# Patient Record
Sex: Female | Born: 1980 | Race: White | Hispanic: No | Marital: Married | Smoking: Never smoker
Health system: Southern US, Community
[De-identification: ages and names within clinical notes are randomized; demographics above are authoritative.]

## PROBLEM LIST (undated history)

## (undated) ENCOUNTER — Ambulatory Visit

## (undated) DIAGNOSIS — K2970 Gastritis, unspecified, without bleeding: Secondary | ICD-10-CM

## (undated) DIAGNOSIS — G43909 Migraine, unspecified, not intractable, without status migrainosus: Secondary | ICD-10-CM

## (undated) DIAGNOSIS — F32A Depression, unspecified: Secondary | ICD-10-CM

## (undated) DIAGNOSIS — K219 Gastro-esophageal reflux disease without esophagitis: Secondary | ICD-10-CM

## (undated) DIAGNOSIS — M419 Scoliosis, unspecified: Secondary | ICD-10-CM

## (undated) DIAGNOSIS — F329 Major depressive disorder, single episode, unspecified: Secondary | ICD-10-CM

## (undated) DIAGNOSIS — K259 Gastric ulcer, unspecified as acute or chronic, without hemorrhage or perforation: Secondary | ICD-10-CM

## (undated) DIAGNOSIS — F419 Anxiety disorder, unspecified: Secondary | ICD-10-CM

## (undated) HISTORY — DX: Depression, unspecified: F32.A

## (undated) HISTORY — DX: Gastro-esophageal reflux disease without esophagitis: K21.9

## (undated) HISTORY — PX: RHINOPLASTY: SUR1284

## (undated) HISTORY — DX: Migraine, unspecified, not intractable, without status migrainosus: G43.909

## (undated) HISTORY — DX: Gastric ulcer, unspecified as acute or chronic, without hemorrhage or perforation: K25.9

## (undated) HISTORY — DX: Major depressive disorder, single episode, unspecified: F32.9

## (undated) HISTORY — DX: Anxiety disorder, unspecified: F41.9

## (undated) HISTORY — DX: Gastritis, unspecified, without bleeding: K29.70

---
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
## 2005-02-04 ENCOUNTER — Emergency Department: Payer: Self-pay | Admitting: Emergency Medicine

## 2005-03-12 ENCOUNTER — Emergency Department: Payer: Self-pay | Admitting: Unknown Physician Specialty

## 2005-10-06 ENCOUNTER — Emergency Department: Payer: Self-pay | Admitting: Internal Medicine

## 2006-03-24 ENCOUNTER — Emergency Department: Payer: Self-pay | Admitting: Emergency Medicine

## 2006-12-24 ENCOUNTER — Emergency Department: Payer: Self-pay

## 2008-07-25 ENCOUNTER — Emergency Department: Payer: Self-pay | Admitting: Emergency Medicine

## 2008-10-10 ENCOUNTER — Emergency Department: Payer: Self-pay | Admitting: Unknown Physician Specialty

## 2008-12-08 ENCOUNTER — Emergency Department: Payer: Self-pay | Admitting: Emergency Medicine

## 2009-04-18 ENCOUNTER — Emergency Department: Payer: Self-pay | Admitting: Unknown Physician Specialty

## 2009-05-08 ENCOUNTER — Observation Stay: Payer: Self-pay

## 2009-06-01 ENCOUNTER — Observation Stay: Payer: Self-pay

## 2009-06-05 ENCOUNTER — Inpatient Hospital Stay: Payer: Self-pay

## 2010-01-20 ENCOUNTER — Emergency Department: Payer: Self-pay | Admitting: Emergency Medicine

## 2010-03-30 ENCOUNTER — Emergency Department: Payer: Self-pay | Admitting: Internal Medicine

## 2010-06-22 ENCOUNTER — Emergency Department: Payer: Self-pay | Admitting: Emergency Medicine

## 2010-10-18 ENCOUNTER — Emergency Department: Payer: Self-pay | Admitting: Internal Medicine

## 2010-11-07 ENCOUNTER — Emergency Department: Payer: Self-pay | Admitting: Emergency Medicine

## 2011-04-10 ENCOUNTER — Emergency Department: Payer: Self-pay | Admitting: Unknown Physician Specialty

## 2011-08-04 ENCOUNTER — Observation Stay: Payer: Self-pay

## 2011-11-12 HISTORY — PX: TUBAL LIGATION: SHX77

## 2011-12-11 ENCOUNTER — Inpatient Hospital Stay: Payer: Self-pay

## 2011-12-11 LAB — CBC WITH DIFFERENTIAL/PLATELET
Eosinophil #: 0.1 10*3/uL (ref 0.0–0.7)
Eosinophil %: 0.5 %
HCT: 33.4 % — ABNORMAL LOW (ref 35.0–47.0)
HGB: 11.2 g/dL — ABNORMAL LOW (ref 12.0–16.0)
Lymphocyte #: 2.7 10*3/uL (ref 1.0–3.6)
Lymphocyte %: 24.9 %
MCHC: 33.4 g/dL (ref 32.0–36.0)
MCV: 94 fL (ref 80–100)
Monocyte #: 1 10*3/uL — ABNORMAL HIGH (ref 0.0–0.7)
Neutrophil %: 64.7 %
RBC: 3.56 10*6/uL — ABNORMAL LOW (ref 3.80–5.20)
RDW: 14.1 % (ref 11.5–14.5)

## 2011-12-13 LAB — HEMATOCRIT: HCT: 30.5 % — ABNORMAL LOW (ref 35.0–47.0)

## 2012-08-29 ENCOUNTER — Emergency Department: Payer: Self-pay | Admitting: Emergency Medicine

## 2012-08-29 LAB — URINALYSIS, COMPLETE
Bilirubin,UR: NEGATIVE
Blood: NEGATIVE
Glucose,UR: NEGATIVE mg/dL (ref 0–75)
Ketone: NEGATIVE
Leukocyte Esterase: NEGATIVE
Ph: 7 (ref 4.5–8.0)
Protein: NEGATIVE
Squamous Epithelial: 2

## 2012-08-29 LAB — CBC
HGB: 13 g/dL (ref 12.0–16.0)
MCHC: 34.3 g/dL (ref 32.0–36.0)
Platelet: 298 10*3/uL (ref 150–440)
RBC: 4.17 10*6/uL (ref 3.80–5.20)
WBC: 7.5 10*3/uL (ref 3.6–11.0)

## 2012-08-29 LAB — BASIC METABOLIC PANEL
Anion Gap: 9 (ref 7–16)
Calcium, Total: 8.2 mg/dL — ABNORMAL LOW (ref 8.5–10.1)
Chloride: 109 mmol/L — ABNORMAL HIGH (ref 98–107)
Creatinine: 0.7 mg/dL (ref 0.60–1.30)
EGFR (Non-African Amer.): >60
Glucose: 104 mg/dL — ABNORMAL HIGH (ref 65–99)
Osmolality: 284 (ref 275–301)
Potassium: 3.6 mmol/L (ref 3.5–5.1)

## 2012-08-29 LAB — PREGNANCY, URINE: Pregnancy Test, Urine: NEGATIVE m[IU]/mL

## 2012-11-11 HISTORY — PX: CHOLECYSTECTOMY: SHX55

## 2012-12-05 ENCOUNTER — Emergency Department: Payer: Self-pay | Admitting: Emergency Medicine

## 2012-12-05 LAB — URINALYSIS, COMPLETE
Bilirubin,UR: NEGATIVE
Nitrite: NEGATIVE
Ph: 6 (ref 4.5–8.0)
RBC,UR: 1 /HPF (ref 0–5)
Specific Gravity: 1.025 (ref 1.003–1.030)
WBC UR: 3 /HPF (ref 0–5)

## 2012-12-05 LAB — WET PREP, GENITAL

## 2013-03-29 ENCOUNTER — Emergency Department: Payer: Self-pay | Admitting: Emergency Medicine

## 2013-04-30 ENCOUNTER — Inpatient Hospital Stay: Payer: Self-pay | Admitting: Specialist

## 2013-04-30 LAB — COMPREHENSIVE METABOLIC PANEL
Albumin: 3.5 g/dL (ref 3.4–5.0)
Anion Gap: 7 (ref 7–16)
Bilirubin,Total: 0.5 mg/dL (ref 0.2–1.0)
EGFR (African American): >60
SGOT(AST): 22 U/L (ref 15–37)
SGPT (ALT): 25 U/L (ref 12–78)
Sodium: 138 mmol/L (ref 136–145)
Total Protein: 7.4 g/dL (ref 6.4–8.2)

## 2013-04-30 LAB — URINALYSIS, COMPLETE
Bilirubin,UR: NEGATIVE
Leukocyte Esterase: NEGATIVE
Nitrite: NEGATIVE
Ph: 7 (ref 4.5–8.0)
WBC UR: 5 /HPF (ref 0–5)

## 2013-04-30 LAB — GC/CHLAMYDIA PROBE AMP

## 2013-04-30 LAB — CBC
HCT: 35.3 % (ref 35.0–47.0)
HGB: 12 g/dL (ref 12.0–16.0)
MCV: 91 fL (ref 80–100)

## 2013-05-01 LAB — CBC WITH DIFFERENTIAL/PLATELET
Basophil #: 0 10*3/uL (ref 0.0–0.1)
Basophil %: 0.1 %
Eosinophil #: 0 10*3/uL (ref 0.0–0.7)
Eosinophil %: 0.1 %
HCT: 31.3 % — ABNORMAL LOW (ref 35.0–47.0)
HGB: 10.7 g/dL — ABNORMAL LOW (ref 12.0–16.0)
Lymphocyte %: 11.5 %
MCH: 31 pg (ref 26.0–34.0)
MCHC: 34.1 g/dL (ref 32.0–36.0)
Monocyte %: 11 %
Neutrophil #: 11 10*3/uL — ABNORMAL HIGH (ref 1.4–6.5)
Neutrophil %: 77.3 %
RBC: 3.44 10*6/uL — ABNORMAL LOW (ref 3.80–5.20)
RDW: 12.7 % (ref 11.5–14.5)
WBC: 14.2 10*3/uL — ABNORMAL HIGH (ref 3.6–11.0)

## 2013-05-01 LAB — BASIC METABOLIC PANEL
Anion Gap: 7 (ref 7–16)
Calcium, Total: 7.6 mg/dL — ABNORMAL LOW (ref 8.5–10.1)
Chloride: 110 mmol/L — ABNORMAL HIGH (ref 98–107)
Co2: 24 mmol/L (ref 21–32)
EGFR (African American): >60
EGFR (Non-African Amer.): >60
Glucose: 111 mg/dL — ABNORMAL HIGH (ref 65–99)
Osmolality: 279 (ref 275–301)
Potassium: 3.2 mmol/L — ABNORMAL LOW (ref 3.5–5.1)

## 2013-05-01 LAB — LIPID PANEL
Cholesterol: 104 mg/dL (ref 0–200)
HDL Cholesterol: 42 mg/dL (ref 40–60)
VLDL Cholesterol, Calc: 13 mg/dL (ref 5–40)

## 2013-05-01 LAB — MAGNESIUM: Magnesium: 1.6 mg/dL — ABNORMAL LOW

## 2013-05-02 LAB — BASIC METABOLIC PANEL
Anion Gap: 7 (ref 7–16)
BUN: 5 mg/dL — ABNORMAL LOW (ref 7–18)
Calcium, Total: 7.4 mg/dL — ABNORMAL LOW (ref 8.5–10.1)
Chloride: 111 mmol/L — ABNORMAL HIGH (ref 98–107)
Creatinine: 0.63 mg/dL (ref 0.60–1.30)
EGFR (African American): >60
Glucose: 99 mg/dL (ref 65–99)
Osmolality: 284 (ref 275–301)

## 2013-05-06 LAB — CULTURE, BLOOD (SINGLE)

## 2013-05-18 LAB — URINALYSIS, COMPLETE
Nitrite: NEGATIVE
Protein: 30
RBC,UR: 8 /HPF (ref 0–5)
Specific Gravity: 1.025 (ref 1.003–1.030)
Squamous Epithelial: 15
WBC UR: 34 /HPF (ref 0–5)

## 2013-05-18 LAB — COMPREHENSIVE METABOLIC PANEL
Alkaline Phosphatase: 74 U/L (ref 50–136)
Anion Gap: 9 (ref 7–16)
BUN: 11 mg/dL (ref 7–18)
Calcium, Total: 8.9 mg/dL (ref 8.5–10.1)
Chloride: 103 mmol/L (ref 98–107)
Co2: 26 mmol/L (ref 21–32)
Creatinine: 0.73 mg/dL (ref 0.60–1.30)
Glucose: 101 mg/dL — ABNORMAL HIGH (ref 65–99)
SGOT(AST): 14 U/L — ABNORMAL LOW (ref 15–37)
Sodium: 138 mmol/L (ref 136–145)
Total Protein: 7.8 g/dL (ref 6.4–8.2)

## 2013-05-18 LAB — PREGNANCY, URINE: Pregnancy Test, Urine: NEGATIVE m[IU]/mL

## 2013-05-18 LAB — CBC
HCT: 35.7 % (ref 35.0–47.0)
HGB: 12.1 g/dL (ref 12.0–16.0)
MCHC: 34 g/dL (ref 32.0–36.0)
MCV: 91 fL (ref 80–100)

## 2013-05-18 LAB — LIPASE, BLOOD: Lipase: 90 U/L (ref 73–393)

## 2013-05-19 ENCOUNTER — Observation Stay: Payer: Self-pay | Admitting: Surgery

## 2013-05-20 LAB — CBC WITH DIFFERENTIAL/PLATELET
Basophil #: 0 10*3/uL (ref 0.0–0.1)
Basophil %: 0.6 %
Eosinophil %: 2.2 %
HCT: 31.4 % — ABNORMAL LOW (ref 35.0–47.0)
MCH: 31.6 pg (ref 26.0–34.0)
MCHC: 35.1 g/dL (ref 32.0–36.0)
MCV: 90 fL (ref 80–100)
Monocyte %: 13.2 %
Neutrophil #: 2.1 10*3/uL (ref 1.4–6.5)
Neutrophil %: 36.5 %
RBC: 3.49 10*6/uL — ABNORMAL LOW (ref 3.80–5.20)
RDW: 12.9 % (ref 11.5–14.5)

## 2013-05-20 LAB — COMPREHENSIVE METABOLIC PANEL
Albumin: 3.1 g/dL — ABNORMAL LOW (ref 3.4–5.0)
Alkaline Phosphatase: 61 U/L (ref 50–136)
Anion Gap: 8 (ref 7–16)
Bilirubin,Total: 0.2 mg/dL (ref 0.2–1.0)
Chloride: 106 mmol/L (ref 98–107)
Co2: 27 mmol/L (ref 21–32)
Creatinine: 0.79 mg/dL (ref 0.60–1.30)
Glucose: 103 mg/dL — ABNORMAL HIGH (ref 65–99)
Osmolality: 278 (ref 275–301)
Sodium: 141 mmol/L (ref 136–145)
Total Protein: 6.6 g/dL (ref 6.4–8.2)

## 2013-06-21 ENCOUNTER — Ambulatory Visit: Payer: Self-pay | Admitting: Gastroenterology

## 2013-08-11 ENCOUNTER — Emergency Department: Payer: Self-pay | Admitting: Emergency Medicine

## 2013-08-16 ENCOUNTER — Ambulatory Visit: Payer: Self-pay | Admitting: Obstetrics and Gynecology

## 2013-08-16 LAB — CBC
HCT: 35 % (ref 35.0–47.0)
MCH: 30.6 pg (ref 26.0–34.0)
Platelet: 323 10*3/uL (ref 150–440)
RBC: 3.8 10*6/uL (ref 3.80–5.20)
RDW: 13.3 % (ref 11.5–14.5)
WBC: 22.5 10*3/uL — ABNORMAL HIGH (ref 3.6–11.0)

## 2013-08-16 LAB — COMPREHENSIVE METABOLIC PANEL
Albumin: 3.3 g/dL — ABNORMAL LOW (ref 3.4–5.0)
Anion Gap: 9 (ref 7–16)
BUN: 10 mg/dL (ref 7–18)
Chloride: 110 mmol/L — ABNORMAL HIGH (ref 98–107)
Creatinine: 0.91 mg/dL (ref 0.60–1.30)
EGFR (African American): >60
Glucose: 167 mg/dL — ABNORMAL HIGH (ref 65–99)
Osmolality: 282 (ref 275–301)
SGOT(AST): 17 U/L (ref 15–37)
Sodium: 140 mmol/L (ref 136–145)
Total Protein: 6.1 g/dL — ABNORMAL LOW (ref 6.4–8.2)

## 2013-08-16 LAB — LIPASE, BLOOD: Lipase: 69 U/L — ABNORMAL LOW (ref 73–393)

## 2013-08-16 LAB — HCG, QUANTITATIVE, PREGNANCY: Beta Hcg, Quant.: <1 m[IU]/mL — ABNORMAL LOW

## 2013-08-17 LAB — URINALYSIS, COMPLETE
Bilirubin,UR: NEGATIVE
Glucose,UR: NEGATIVE mg/dL (ref 0–75)
Ketone: NEGATIVE
RBC,UR: 12 /HPF (ref 0–5)
Specific Gravity: 1.009 (ref 1.003–1.030)
Squamous Epithelial: 5
WBC UR: 466 /HPF (ref 0–5)

## 2013-08-17 LAB — HEMATOCRIT: HCT: 23.6 % — ABNORMAL LOW (ref 35.0–47.0)

## 2013-08-19 LAB — URINE CULTURE

## 2013-10-05 ENCOUNTER — Emergency Department: Payer: Self-pay | Admitting: Emergency Medicine

## 2013-10-11 ENCOUNTER — Other Ambulatory Visit: Payer: Self-pay

## 2013-10-11 LAB — CBC WITH DIFFERENTIAL/PLATELET
Basophil %: 0.6 %
Eosinophil #: 0.2 10*3/uL (ref 0.0–0.7)
Eosinophil %: 1.4 %
HCT: 38.2 % (ref 35.0–47.0)
HGB: 12.8 g/dL (ref 12.0–16.0)
Lymphocyte #: 3.3 10*3/uL (ref 1.0–3.6)
MCH: 30.4 pg (ref 26.0–34.0)
MCHC: 33.4 g/dL (ref 32.0–36.0)
Monocyte %: 7.8 %
Neutrophil %: 61.6 %
Platelet: 331 10*3/uL (ref 150–440)
RBC: 4.2 10*6/uL (ref 3.80–5.20)
RDW: 12.7 % (ref 11.5–14.5)
WBC: 11.4 10*3/uL — ABNORMAL HIGH (ref 3.6–11.0)

## 2013-10-11 LAB — RAPID HIV-1/2 QL/CONFIRM: HIV-1/2,Rapid Ql: NEGATIVE

## 2013-11-11 HISTORY — PX: EXPLORATORY LAPAROTOMY: SUR591

## 2013-12-26 ENCOUNTER — Emergency Department: Payer: Self-pay

## 2013-12-30 ENCOUNTER — Other Ambulatory Visit: Payer: Self-pay | Admitting: Family Medicine

## 2013-12-30 LAB — COMPREHENSIVE METABOLIC PANEL
Albumin: 3.7 g/dL (ref 3.4–5.0)
Alkaline Phosphatase: 72 U/L
Anion Gap: 9 (ref 7–16)
BUN: 11 mg/dL (ref 7–18)
Bilirubin,Total: 0.2 mg/dL (ref 0.2–1.0)
Calcium, Total: 8.9 mg/dL (ref 8.5–10.1)
Chloride: 109 mmol/L — ABNORMAL HIGH (ref 98–107)
Co2: 23 mmol/L (ref 21–32)
Creatinine: 0.7 mg/dL (ref 0.60–1.30)
EGFR (African American): >60
EGFR (Non-African Amer.): >60
Glucose: 70 mg/dL (ref 65–99)
Osmolality: 279 (ref 275–301)
Potassium: 3.9 mmol/L (ref 3.5–5.1)
SGOT(AST): 19 U/L (ref 15–37)
SGPT (ALT): 21 U/L (ref 12–78)
Sodium: 141 mmol/L (ref 136–145)
Total Protein: 7.4 g/dL (ref 6.4–8.2)

## 2013-12-30 LAB — CBC WITH DIFFERENTIAL/PLATELET
BASOS ABS: 0 10*3/uL (ref 0.0–0.1)
Basophil %: 0.6 %
EOS ABS: 0.1 10*3/uL (ref 0.0–0.7)
Eosinophil %: 1.2 %
HCT: 39.1 % (ref 35.0–47.0)
HGB: 12.7 g/dL (ref 12.0–16.0)
LYMPHS ABS: 2.7 10*3/uL (ref 1.0–3.6)
Lymphocyte %: 34 %
MCH: 30.4 pg (ref 26.0–34.0)
MCHC: 32.5 g/dL (ref 32.0–36.0)
MCV: 94 fL (ref 80–100)
Monocyte #: 0.7 x10 3/mm (ref 0.2–0.9)
Monocyte %: 9 %
NEUTROS PCT: 55.2 %
Neutrophil #: 4.3 10*3/uL (ref 1.4–6.5)
PLATELETS: 297 10*3/uL (ref 150–440)
RBC: 4.18 10*6/uL (ref 3.80–5.20)
RDW: 15.3 % — AB (ref 11.5–14.5)
WBC: 7.8 10*3/uL (ref 3.6–11.0)

## 2013-12-30 LAB — TSH: THYROID STIMULATING HORM: 0.575 u[IU]/mL

## 2014-01-07 ENCOUNTER — Encounter: Payer: Self-pay | Admitting: Neurology

## 2014-01-10 ENCOUNTER — Ambulatory Visit (INDEPENDENT_AMBULATORY_CARE_PROVIDER_SITE_OTHER): Payer: Medicaid Other | Admitting: Neurology

## 2014-01-10 ENCOUNTER — Encounter: Payer: Self-pay | Admitting: Neurology

## 2014-01-10 ENCOUNTER — Encounter (INDEPENDENT_AMBULATORY_CARE_PROVIDER_SITE_OTHER): Payer: Self-pay

## 2014-01-10 VITALS — BP 118/73 | HR 53 | Ht 65.75 in | Wt 175.0 lb

## 2014-01-10 DIAGNOSIS — G43109 Migraine with aura, not intractable, without status migrainosus: Secondary | ICD-10-CM | POA: Insufficient documentation

## 2014-01-10 MED ORDER — NAPROXEN 250 MG PO TABS: 250.0000 mg | ORAL_TABLET | Freq: Two times a day (BID) | ORAL | Status: DC

## 2014-01-10 NOTE — Patient Instructions (Signed)
Overall you are doing fairly well but I do want to suggest a few things today:   Remember to drink plenty of fluid, eat healthy meals and do not skip any meals. Try to eat protein with a every meal and eat a healthy snack such as fruit or nuts in between meals. Try to keep a regular sleep-wake schedule and try to exercise daily, particularly in the form of walking, 20-30 minutes a day, if you can.   As far as your medications are concerned, I would like to suggest the following: 1)Change Topamax to 50mg  (1 tab) in the morning and 100mg  (2 tabs) in the evening 2)Limit your Imitrex to no more than 2 tabs in a 24hr period 3)Try to taper down the Fioricet 4)Use Naproxen 2 times a day as needed for breakthrough headaches. Please use with food  I would like to see you back in 3 months, sooner if we need to. Please call us with any interim questions, concerns, problems, updates or refill requests.   My clinical assistant and will answer any of your questions and relay your messages to me and also relay most of my messages to you.   Our phone number is [phone]. We also have an after hours call service for urgent matters and there is a physician on-call for urgent questions. For any emergencies you know to call 911 or go to the nearest emergency room

## 2014-01-10 NOTE — Progress Notes (Signed)
[id number]    Provider:  Dr Hosie PoissonSumner Referring Provider: Lyndon CodeKhan, Fozia M, MD Primary Care Physician:  No primary provider on file.  CC:  migraines  HPI:  Barron Alvineiffany Maddox is a 33 y.o. female here as a referral from Dr. Welton FlakesKhan for migraine evaluation  Started at age 33, has baseline headache everyday with severe migraine exacerbation 3-4 times a week. Typically will last >6 hours to a whole day. Notes recently migraines have started to have more atypical symptoms, having more left sided weakness and sensory changes. This is new in the past few weeks. Has noted N/V, photo and phonophobia. Has blurred vision and floating spots when she has a severe migraine. Otherwise no change in vision. Denies any difficulty with sleep, no snoring, no apnea events.   Having difficulty with word finding, has noted this for the past one to two months.   Has been taking Topamax since October, dose was increased to 200mg  at night, has been on this regimen for the past few weeks. No other daily prophylactic medication. Currently taking Imitrex, has taken up to 4 in a day when headaches are severe. Also takes Fioricet, can do up to 10 a day with severe headaches.    Review of Systems: Out of a complete 14 system review, the patient complains of only the following symptoms, and all other reviewed systems are negative. Positive fatigue blurred vision double vision loss of vision easy bruising memory loss confusion headache numbness weakness slurred speech dizziness depression anxiety  History   Social History  . Marital Status: Legally Separated    Spouse Name: N/A    Number of Children: 3  . Years of Education: 12+   Occupational History  . Assembly line     [employer]   Social History Main Topics  . Smoking status: Never Smoker   . Smokeless tobacco: Never Used  . Alcohol Use: Yes     Comment: Social  . Drug Use: No  . Sexual Activity: Yes   Other Topics Concern  . Not on file    Social History Narrative   Patient is engaged to [place] and lives to him   Patient has 3 children.    Education level some college.    Caffeine consumption is daily    Family History  Problem Relation Age of Onset  . Cancer - Other      LIVER  . Diabetes    . Hypertension    . Hypothyroidism    . Stroke    . Heart disease      Past Medical History  Diagnosis Date  . Migraines   . Gastritis     Past Surgical History  Procedure Laterality Date  . Appendectomy  1993  . Cholecystectomy  2014  . Tubal ligation  2013    Current Outpatient Prescriptions  Medication Sig Dispense Refill  . ALPRAZolam (XANAX) 0.25 MG tablet Take 0.25 mg by mouth 2 (two) times daily as needed for anxiety.      . butalbital-acetaminophen-caffeine (FIORICET) 50-325-40 MG per tablet Take by mouth 2 (two) times daily as needed for headache (Take 1-2 tablets by mouth every 4-6 hours).      . sertraline (ZOLOFT) 100 MG tablet Take 100 mg by mouth daily.      . SUMAtriptan (IMITREX) 100 MG tablet Take 100 mg by mouth every 2 (two) hours as needed for migraine or headache. May repeat in 2 hours if headache persists or recurs.      .Marland Kitchen  topiramate (TOPAMAX) 50 MG tablet Take 50 mg by mouth 2 (two) times daily.       No current facility-administered medications for this visit.    Allergies as of 01/10/2014 - Review Complete 01/10/2014  Allergen Reaction Noted  . Sulfa antibiotics  01/07/2014    Vitals: BP 118/73  Pulse 53  Ht 5' 5.75" (1.67 m)  Wt 175 lb (79.379 kg)  BMI 28.46 kg/m2 Last Weight:  Wt Readings from Last 1 Encounters:  01/10/14 175 lb (79.379 kg)   Last Height:   Ht Readings from Last 1 Encounters:  01/10/14 5' 5.75" (1.67 m)     Physical exam: Exam: Gen: NAD, conversant Eyes: anicteric sclerae, moist conjunctivae HENT: Atraumatic, oropharynx clear Neck: Trachea midline; supple,  Lungs: CTA, no wheezing, rales, rhonic                          CV: RRR, no MRG Abdomen:  Soft, non-tender;  Extremities: No peripheral edema  Skin: Normal temperature, no rash,  Psych: Appropriate affect, pleasant  Neuro: MS: AA&Ox3, appropriately interactive, normal affect   Speech: fluent w/o paraphasic error  Memory: good recent and remote recall  CN: PERRL, EOMI no nystagmus, VFF to FC bilat, unable to fully visualize fundus bilaterally, no ptosis, sensation intact to LT V1-V3 bilat, face symmetric, no weakness, hearing grossly intact, palate elevates symmetrically, shoulder shrug 5/5 bilat,  tongue protrudes midline, no fasiculations noted.  Motor: normal bulk and tone Strength: 5/5  In all extremities  Coord: rapid alternating and point-to-point (FNF, HTS) movements intact.  Reflexes: symmetrical, bilat downgoing toes  Sens: LT intact in all extremities  Gait: posture, stance, stride and arm-swing normal. Tandem gait intact. Able to walk on heels and toes. Romberg absent.   Assessment:  After physical and neurologic examination, review of laboratory studies, imaging, neurophysiology testing and pre-existing records, assessment will be reviewed on the problem list.  Plan:  Treatment plan and additional workup will be reviewed under Problem List.  1)Migraine headache  33 year old woman sent in for initial evaluation of migraine headaches. 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 Counseled her on importance of not using more than 2 Imitrex in a 24-hour period. Will also work on tapering down the Fioricet. Will change Topamax to 50mg  in the morning and 100mg  in the evening. Will start Naproxen as needed. Follow up in 3 months.    Elspeth Cho, DO  [facility] [address] [place], [place] [address]  Phone [phone] Fax [phone]

## 2014-01-17 ENCOUNTER — Emergency Department: Payer: Self-pay | Admitting: Emergency Medicine

## 2014-01-17 LAB — COMPREHENSIVE METABOLIC PANEL
ALK PHOS: 62 U/L
Albumin: 4 g/dL (ref 3.4–5.0)
Anion Gap: 3 — ABNORMAL LOW (ref 7–16)
BILIRUBIN TOTAL: 0.4 mg/dL (ref 0.2–1.0)
BUN: 20 mg/dL — ABNORMAL HIGH (ref 7–18)
Calcium, Total: 8.1 mg/dL — ABNORMAL LOW (ref 8.5–10.1)
Chloride: 110 mmol/L — ABNORMAL HIGH (ref 98–107)
Co2: 25 mmol/L (ref 21–32)
Creatinine: 0.79 mg/dL (ref 0.60–1.30)
EGFR (African American): >60
EGFR (Non-African Amer.): >60
GLUCOSE: 117 mg/dL — AB (ref 65–99)
OSMOLALITY: 279 (ref 275–301)
Potassium: 4.7 mmol/L (ref 3.5–5.1)
SGOT(AST): 35 U/L (ref 15–37)
SGPT (ALT): 19 U/L (ref 12–78)
Sodium: 138 mmol/L (ref 136–145)
Total Protein: 7.8 g/dL (ref 6.4–8.2)

## 2014-01-17 LAB — URINALYSIS, COMPLETE
BLOOD: NEGATIVE
Bilirubin,UR: NEGATIVE
Glucose,UR: NEGATIVE mg/dL (ref 0–75)
Ketone: NEGATIVE
Leukocyte Esterase: NEGATIVE
Nitrite: NEGATIVE
Ph: 6 (ref 4.5–8.0)
RBC,UR: 3 /HPF (ref 0–5)
SPECIFIC GRAVITY: 1.029 (ref 1.003–1.030)
Squamous Epithelial: 5
WBC UR: 4 /HPF (ref 0–5)

## 2014-01-17 LAB — DRUG SCREEN, URINE
AMPHETAMINES, UR SCREEN: NEGATIVE (ref ?–1000)
Barbiturates, Ur Screen: NEGATIVE (ref ?–200)
Benzodiazepine, Ur Scrn: POSITIVE (ref ?–200)
CANNABINOID 50 NG, UR ~~LOC~~: POSITIVE (ref ?–50)
Cocaine Metabolite,Ur ~~LOC~~: NEGATIVE (ref ?–300)
MDMA (Ecstasy)Ur Screen: NEGATIVE (ref ?–500)
Methadone, Ur Screen: NEGATIVE (ref ?–300)
Opiate, Ur Screen: NEGATIVE (ref ?–300)
Phencyclidine (PCP) Ur S: NEGATIVE (ref ?–25)
Tricyclic, Ur Screen: NEGATIVE (ref ?–1000)

## 2014-01-17 LAB — CBC
HCT: 39.4 % (ref 35.0–47.0)
HGB: 12.3 g/dL (ref 12.0–16.0)
MCH: 29.3 pg (ref 26.0–34.0)
MCHC: 31.1 g/dL — ABNORMAL LOW (ref 32.0–36.0)
MCV: 94 fL (ref 80–100)
Platelet: 327 10*3/uL (ref 150–440)
RBC: 4.18 10*6/uL (ref 3.80–5.20)
RDW: 14.9 % — ABNORMAL HIGH (ref 11.5–14.5)
WBC: 11.3 10*3/uL — ABNORMAL HIGH (ref 3.6–11.0)

## 2014-01-17 LAB — ETHANOL

## 2014-01-17 LAB — TROPONIN I: Troponin-I: <0.02 ng/mL

## 2014-04-12 ENCOUNTER — Telehealth: Payer: Self-pay | Admitting: Neurology

## 2014-04-12 ENCOUNTER — Ambulatory Visit: Payer: Medicaid Other | Admitting: Neurology

## 2014-04-12 NOTE — Telephone Encounter (Signed)
No show for follow up follow up appointment on 6/02

## 2014-04-18 ENCOUNTER — Emergency Department: Payer: Self-pay | Admitting: Emergency Medicine

## 2014-04-18 LAB — URINALYSIS, COMPLETE
BILIRUBIN, UR: NEGATIVE
Blood: NEGATIVE
Glucose,UR: NEGATIVE mg/dL (ref 0–75)
KETONE: NEGATIVE
NITRITE: NEGATIVE
Ph: 6 (ref 4.5–8.0)
Protein: NEGATIVE
RBC,UR: 2 /HPF (ref 0–5)
SPECIFIC GRAVITY: 1.015 (ref 1.003–1.030)
Squamous Epithelial: <1
WBC UR: 36 /HPF (ref 0–5)

## 2014-04-18 LAB — CBC WITH DIFFERENTIAL/PLATELET
Basophil #: 0 10*3/uL (ref 0.0–0.1)
Basophil %: 0.3 %
EOS PCT: 1.3 %
Eosinophil #: 0.1 10*3/uL (ref 0.0–0.7)
HCT: 39 % (ref 35.0–47.0)
HGB: 12.5 g/dL (ref 12.0–16.0)
LYMPHS ABS: 2.2 10*3/uL (ref 1.0–3.6)
Lymphocyte %: 20.8 %
MCH: 30.8 pg (ref 26.0–34.0)
MCHC: 32.1 g/dL (ref 32.0–36.0)
MCV: 96 fL (ref 80–100)
MONO ABS: 0.7 x10 3/mm (ref 0.2–0.9)
Monocyte %: 6.9 %
Neutrophil #: 7.6 10*3/uL — ABNORMAL HIGH (ref 1.4–6.5)
Neutrophil %: 70.7 %
Platelet: 247 10*3/uL (ref 150–440)
RBC: 4.07 10*6/uL (ref 3.80–5.20)
RDW: 12.9 % (ref 11.5–14.5)
WBC: 10.8 10*3/uL (ref 3.6–11.0)

## 2014-04-18 LAB — BASIC METABOLIC PANEL
ANION GAP: 5 — AB (ref 7–16)
BUN: 14 mg/dL (ref 7–18)
Calcium, Total: 8.3 mg/dL — ABNORMAL LOW (ref 8.5–10.1)
Chloride: 110 mmol/L — ABNORMAL HIGH (ref 98–107)
Co2: 26 mmol/L (ref 21–32)
Creatinine: 0.84 mg/dL (ref 0.60–1.30)
EGFR (African American): >60
EGFR (Non-African Amer.): >60
Glucose: 104 mg/dL — ABNORMAL HIGH (ref 65–99)
OSMOLALITY: 282 (ref 275–301)
POTASSIUM: 3.3 mmol/L — AB (ref 3.5–5.1)
Sodium: 141 mmol/L (ref 136–145)

## 2014-04-18 LAB — TROPONIN I: Troponin-I: <0.02 ng/mL

## 2014-05-25 ENCOUNTER — Ambulatory Visit: Payer: Self-pay | Admitting: Cardiovascular Disease

## 2014-05-28 LAB — URINALYSIS, COMPLETE
BACTERIA: NONE SEEN
Bilirubin,UR: NEGATIVE
GLUCOSE, UR: NEGATIVE mg/dL (ref 0–75)
Ketone: NEGATIVE
LEUKOCYTE ESTERASE: NEGATIVE
NITRITE: POSITIVE
PH: 6 (ref 4.5–8.0)
Protein: NEGATIVE
RBC,UR: 2 /HPF (ref 0–5)
SPECIFIC GRAVITY: 1.018 (ref 1.003–1.030)
Squamous Epithelial: 14
WBC UR: 4 /HPF (ref 0–5)

## 2014-05-28 LAB — COMPREHENSIVE METABOLIC PANEL
Albumin: 3.8 g/dL (ref 3.4–5.0)
Alkaline Phosphatase: 65 U/L
Anion Gap: 6 — ABNORMAL LOW (ref 7–16)
BILIRUBIN TOTAL: 0.2 mg/dL (ref 0.2–1.0)
BUN: 13 mg/dL (ref 7–18)
CO2: 25 mmol/L (ref 21–32)
Calcium, Total: 8.6 mg/dL (ref 8.5–10.1)
Chloride: 111 mmol/L — ABNORMAL HIGH (ref 98–107)
Creatinine: 0.82 mg/dL (ref 0.60–1.30)
EGFR (African American): >60
EGFR (Non-African Amer.): >60
GLUCOSE: 101 mg/dL — AB (ref 65–99)
OSMOLALITY: 283 (ref 275–301)
Potassium: 3.9 mmol/L (ref 3.5–5.1)
SGOT(AST): 18 U/L (ref 15–37)
SGPT (ALT): 20 U/L (ref 12–78)
SODIUM: 142 mmol/L (ref 136–145)
TOTAL PROTEIN: 7.7 g/dL (ref 6.4–8.2)

## 2014-05-28 LAB — DRUG SCREEN, URINE
AMPHETAMINES, UR SCREEN: NEGATIVE (ref ?–1000)
BARBITURATES, UR SCREEN: POSITIVE (ref ?–200)
Benzodiazepine, Ur Scrn: NEGATIVE (ref ?–200)
CANNABINOID 50 NG, UR ~~LOC~~: NEGATIVE (ref ?–50)
Cocaine Metabolite,Ur ~~LOC~~: NEGATIVE (ref ?–300)
MDMA (Ecstasy)Ur Screen: NEGATIVE (ref ?–500)
METHADONE, UR SCREEN: NEGATIVE (ref ?–300)
Opiate, Ur Screen: NEGATIVE (ref ?–300)
Phencyclidine (PCP) Ur S: NEGATIVE (ref ?–25)
TRICYCLIC, UR SCREEN: NEGATIVE (ref ?–1000)

## 2014-05-28 LAB — CBC
HCT: 39.5 % (ref 35.0–47.0)
HGB: 13.4 g/dL (ref 12.0–16.0)
MCH: 32.3 pg (ref 26.0–34.0)
MCHC: 34 g/dL (ref 32.0–36.0)
MCV: 95 fL (ref 80–100)
PLATELETS: 289 10*3/uL (ref 150–440)
RBC: 4.16 10*6/uL (ref 3.80–5.20)
RDW: 13.1 % (ref 11.5–14.5)
WBC: 10.9 10*3/uL (ref 3.6–11.0)

## 2014-05-28 LAB — ACETAMINOPHEN LEVEL: Acetaminophen: <2 ug/mL

## 2014-05-28 LAB — ETHANOL: Ethanol %: <0.003 % (ref 0.000–0.080)

## 2014-05-28 LAB — SALICYLATE LEVEL

## 2014-05-29 ENCOUNTER — Inpatient Hospital Stay: Payer: Self-pay | Admitting: Psychiatry

## 2014-05-29 LAB — PREGNANCY, URINE: PREGNANCY TEST, URINE: NEGATIVE m[IU]/mL

## 2014-05-30 LAB — TSH: THYROID STIMULATING HORM: 0.121 u[IU]/mL — AB

## 2015-01-30 ENCOUNTER — Emergency Department: Payer: Self-pay | Admitting: Emergency Medicine

## 2015-02-11 LAB — URINE CULTURE

## 2015-02-20 ENCOUNTER — Ambulatory Visit
Admit: 2015-02-20 | Disposition: A | Discharge status: Home / Self Care | Payer: Self-pay | Attending: Obstetrics and Gynecology | Admitting: Obstetrics and Gynecology

## 2015-02-20 LAB — HEMOGLOBIN: HGB: 13.3 g/dL (ref 12.0–16.0)

## 2015-02-23 ENCOUNTER — Ambulatory Visit
Admit: 2015-02-23 | Disposition: A | Discharge status: Home / Self Care | Payer: Self-pay | Attending: Obstetrics and Gynecology | Admitting: Obstetrics and Gynecology

## 2015-03-03 NOTE — Discharge Summary (Signed)
PATIENT NAME:  Amber Nolan, Amber Nolan MR#:  [id number] DATE OF BIRTH:  May 09, 1981  DATE OF ADMISSION:  04/30/2013 DATE OF DISCHARGE:  05/04/2013  For a detailed note, this is history and physical done on admission by Dr. Imogene Burnhen.   DIAGNOSES AT DISCHARGE:  1.  Right upper quadrant and epigastric pain, likely secondary to gastritis and peptic ulcer disease.  2.  History of migraines.  3.  Peptic ulcer disease and gastritis.  4.  Hypokalemia, now resolved.   DIET: The patient is being discharged on a low residue diet.   ACTIVITY: As tolerated.   FOLLOW-UP: With Dr. Barnetta ChapelMartin Skulskie next 1 to 2 weeks.   DISCHARGE MEDICATIONS: Fioricet 1 to 2 tabs q. 4 hours as needed, sucralfate 1 gram q.i.d., Protonix 40 mg b.i.d. and Tylenol with hydrocodone 5/325, 1 tab q. 4 hours as needed for pain.   CONSULTANTS DURING THE HOSPITAL COURSE: Dr. Barnetta ChapelMartin Skulskie from gastroenterology, Dr. Ida Roguehristopher Lundquist from general surgery.   PERTINENT STUDIES DONE DURING THE HOSPITAL COURSE: A CT scan of the abdomen and pelvis done with contrast on admission showing left-sided nephrolithiasis without hydronephrosis. Cholelithiasis.   Abdominal ultrasound limited showing cholelithiasis. Limited visualization of the pancreas. No evidence of acute cholecystitis.   A transvaginal ultrasound showing a normal-appearing pelvic sonogram. Mild right hydronephrosis of unknown significance.   A hepatobiliary scan showing no evidence of acute abnormalities.   Endoscopy done on June 23, showing a normal esophagus, erosive gastritis.   HOSPITAL COURSE: This is a 34 year old female with medical problems as mentioned above, presented to the hospital with epigastric and right upper quadrant abdominal pain and nausea and vomiting.  1.  Epigastric and right upper quadrant abdominal pain and nausea, vomiting. Initially, on admission, the patient was thought to have a suspected acute cholecystitis. Given the CT of abdomen findings, the  patient underwent abdominal ultrasound was also some cholelithiasis. A surgical consult was obtained. The patient was seen by Dr. Juliann PulseLundquist who recommended getting a HIDA scan. The patient's HIDA scan was essentially normal; therefore, the suspicion for cholecystitis was less and therefore surgery and signed off. The patient continued to have some persistent nausea and vomiting and abdominal pain. She does have a history of using increasing amounts of Motrin for her migraine headaches; therefore, a gastroenterology consult was obtained. The patient was seen by Dr. Barnetta ChapelMartin Skulskie, who ended up performing an upper GI endoscopy. The endoscopy did show some erosive gastritis and peptic ulcer disease, which is likely the cause of her abdominal pain, nausea and vomiting. The patient was started on proton pump inhibitors twice daily along with Carafate and her clinical symptoms have significantly improved. She has tolerated a clear liquid diet and advanced to a low-residue diet without any problems and therefore, presently is being discharged on Protonix twice a day and also Carafate. She is strongly being advised to avoid NSAIDs.  2.  Hypokalemia. This was likely secondary to the nausea, vomiting, which has since then been supplemented and resolved.  3.  Fever of unknown origin. When the patient presented to the hospital, she had a fever of 101. Initially it was thought it was probably related to acute cholecystitis, although that has been ruled out. Her blood cultures remained negative. She has been afebrile for the past 72 hours. She was taken off IV antibiotics and still remains afebrile and hemodynamically stable and therefore is being discharged home. The source of the fever is still unclear, but possibly could be related to gastritis and  peptic ulcer disease.  4.  History of migraines. The patient is being discharged on some as needed Fioricet. She has been strongly advised to avoid NSAIDs given her gastritis  and peptic ulcer disease. She also may need referral to a neurologist an outpatient for management of her migraines.   CODE STATUS: The patient is a full code.   TIME SPENT: 40 minutes   ____________________________ Rolly Pancake. Cherlynn Kaiser, MD vjs:cc D: 05/04/2013 15:31:00 ET T: 05/04/2013 16:00:11 ET JOB#: [id number]  cc: Christena Deem, MD Rolly Pancake. Cherlynn Kaiser, MD, <Dictator>  Houston Siren MD ELECTRONICALLY SIGNED 05/14/2013 15:04

## 2015-03-03 NOTE — H&P (Signed)
PATIENT NAME:  Amber Nolan, Amber Nolan MR#:  [id number] DATE OF BIRTH:  [birth year]  DATE OF ADMISSION:  04/30/2013  REFERRING PHYSICIAN: Dr. Pryor MontesKeener.   CHIEF COMPLAINT: Abdominal pain, nausea, vomiting two weeks worsening in one week the fever or chills.   HISTORY OF PRESENT ILLNESS: The patient is a 34 year old Caucasian female with a history of kidney stones, urinary tract infection. She presented in the ED with abdominal pain which is on the right upper quadrant constant, sharp 10/10 associated with some nausea and vomiting for the past two weeks, but worsening for the past one week with fever or chills. The patient's urinalysis is negative.  Abdominal ultrasound and the CAT scan showed gallbladder stones and Dr. Hinda LenisLungdquist saw the patient and suggested questionable cholecystitis but no indication for surgery at this time. The patient is still has abdominal pain, fever, chills. Dr. Pryor MontesKeener request hospitalist to admit the patient.   PAST MEDICAL HISTORY: Urinary tract infection and kidney stone, BV PAST SURGICAL HISTORY: Appendectomy and tubal ligation.   SOCIAL HISTORY: No smoking or drinking or illicit drugs.   FAMILY HISTORY: Hypertension and diabetes.   ALLERGIES: ALLERGIC TO SULFA DRUGS, CINNAMON.   MEDICATIONS:  None.   REVIEW OF SYSTEMS:   CONSTITUTIONAL: The patient has a fever, chills, headache, dizziness and generalized weakness.  EYES: No double vision or blurry vision.  ENT: No postnasal drip, slurred speech or dysphagia. No epistaxis.  CARDIOVASCULAR: No chest pain, palpitation, orthopnea, or nocturnal dyspnea. No leg edema.  PULMONARY: No cough, sputum, shortness of breath or hemoptysis.  GASTROINTESTINAL: Positive for abdominal pain, nausea, vomiting, but no diarrhea, melena, bloody stool.  GENITOURINARY: No dysuria, hematuria, or incontinence.  SKIN: No rash or jaundice.  ENDOCRINOLOGY: No polyuria, polydipsia, heat or cold intolerance.  HEMATOLOGY: No easy bruising or  bleeding.  NEUROLOGY: No syncope, loss of consciousness or seizure.   PHYSICAL EXAMINATION:  VITAL SIGNS: Temperature 99.9, blood pressure 125/78, pulse 92, oxygen saturation 97% on room air.  GENERAL: Patient is alert, awake, oriented, in no acute distress.  HEENT: Pupils round, equal and reactive to light and accommodation. Moist oral mucosa. Clear oropharynx.  NECK: Supple. No JVD or carotid bruits. No lymphadenopathy. No thyromegaly.  CARDIOVASCULAR: S1, S2 regular rate and rhythm. No murmurs, gallops.  PULMONARY: Bilateral air entry. No wheezing or rales. No use of accessory muscles to breathe.  ABDOMEN: Soft. Tenderness on the right side especially on the right upper quadrant. No obvious rigidity.  Difficult to estimate whether patient has organomegaly due to tenderness.  EXTREMITIES: No edema, clubbing or cyanosis. No calf tenderness. Strong bilateral pedal pulses.  SKIN: No rash or jaundice.  NEUROLOGIC: Alert and oriented x3. No focal deficit. Power 5/5. Sensation intact.   LABORATORY DATA: Pelvis ultrasound showed minimal right hydronephrosis of uncertain significance normal appearing pelvic sonogram. Abdominal and pelvis CAT scan showed left side nephrolithiasis without hydronephrosis, cholelithiasis.   Wet prep is negative.  WBC 15.5, hemoglobin 12, platelets 309, lipase 71. Glucose 110, BUN 7, creatinine 0.7. Electrolytes normal. Abdominal ultrasound showed cholelithiasis. A large sessile stone is 2.2 cm.  No evidence of acute cholecystitis. Urinalysis is negative.   IMPRESSION 1. SIRS. 2.  Right upper quadrant pain.  3.  Cholelithiasis, rule out cholecystitis.  4.  History of nephrolithiasis.   PLAN OF TREATMENT. The patient will be admitted to a medical floor. We will start Rocephin, IV PB and keep n.p.o. We will start on normal saline IV fluid support, follow up CBC, blood culture and  we will get a head scan to rule out cholecystitis. I discussed the patient's condition and  plan of treatment with the patient.   TIME SPENT: About 52 minutes   ____________________________ Shaune Pollack, MD qc:am D: 04/30/2013 17:54:48 ET T: 04/30/2013 19:33:03 ET JOB#: [id number]  cc: Shaune Pollack, MD, <Dictator> Shaune Pollack MD ELECTRONICALLY SIGNED 05/01/2013 14:44

## 2015-03-03 NOTE — H&P (Signed)
   Subjective/Chief Complaint 34 y/o WF presents with severe abdominal pain   History of Present Illness Awoke this am with severe abd pain No N/V/fever   Past History H/O migraines S/P BTL Irregular cycles   Past Med/Surgical Hx:  kidney stones:   UTI:   None, patient reports no medical history.:   sinus surgery:   ALLERGIES:  cinnamon: Headaches, Hives  cinnamon: Headaches, Itching  Sulfa drugs: Other  Family and Social History:  Family History Non-Contributory   Place of Living Home   Review of Systems:  Subjective/Chief Complaint abd pain   Fever/Chills No   Cough No   Abdominal Pain Yes   Diarrhea No   Constipation No   Nausea/Vomiting No   SOB/DOE No   Chest Pain No   ROS o/w negative   Physical Exam:  GEN in pain   NECK supple   ABD positive tenderness  + rebound/guard   SKIN normal to palpation   NEURO cranial nerves intact    Assessment/Admission Diagnosis 34 y/o WF with sudden onset severe abd pain CT and U/S c/w blood in abdomen -- most likely ruptured Ov Cyst HCG negative   Plan Dx/oper lapscy   Electronic Signatures: Margaretha GlassingEvans, Ricky L (MD)  (Signed 06-Oct-14 10:11)  Authored: CHIEF COMPLAINT and HISTORY, PAST MEDICAL/SURGIAL HISTORY, ALLERGIES, FAMILY AND SOCIAL HISTORY, REVIEW OF SYSTEMS, PHYSICAL EXAM, ASSESSMENT AND PLAN   Last Updated: 06-Oct-14 10:11 by Margaretha GlassingEvans, Ricky L (MD)

## 2015-03-03 NOTE — Consult Note (Signed)
Chief Complaint:  Subjective/Chief Complaint Pt still in pain. Reviewed EGD report from 2 wks ago. Pt did have gastritis with erosions, but these should not cause this amount of sxs.   VITAL SIGNS/ANCILLARY NOTES: **Vital Signs.:   10-Jul-14 10:17  Vital Signs Type Q 4hr  Temperature Temperature (F) 98.2  Celsius 36.7  Temperature Source oral  Pulse Pulse 67  Respirations Respirations 18  Systolic BP Systolic BP 248  Diastolic BP (mmHg) Diastolic BP (mmHg) 75  Mean BP 88  Pulse Ox % Pulse Ox % 97  Pulse Ox Activity Level  At rest  Oxygen Delivery Room Air/ 21 %   Brief Assessment:  GEN no acute distress   Cardiac Regular   Respiratory clear BS   Gastrointestinal Epig/RUQ tenderness   Lab Results: Hepatic:  10-Jul-14 04:41   Bilirubin, Total 0.2  Alkaline Phosphatase 61  SGPT (ALT) 15  SGOT (AST)  10  Total Protein, Serum 6.6  Albumin, Serum  3.1  Routine Chem:  10-Jul-14 04:41   Glucose, Serum  103  BUN  3  Creatinine (comp) 0.79  Sodium, Serum 141  Potassium, Serum 3.5  Chloride, Serum 106  CO2, Serum 27  Calcium (Total), Serum  8.3  Osmolality (calc) 278  eGFR (African American) >60  eGFR (Non-African American) >60 (eGFR values <74m/min/1.73 m2 may be an indication of chronic kidney disease (CKD). Calculated eGFR is useful in patients with stable renal function. The eGFR calculation will not be reliable in acutely ill patients when serum creatinine is changing rapidly. It is not useful in  patients on dialysis. The eGFR calculation may not be applicable to patients at the low and high extremes of body sizes, pregnant women, and vegetarians.)  Anion Gap 8  Routine Hem:  10-Jul-14 04:41   WBC (CBC) 5.9  RBC (CBC)  3.49  Hemoglobin (CBC)  11.0  Hematocrit (CBC)  31.4  Platelet Count (CBC) 258  MCV 90  MCH 31.6  MCHC 35.1  RDW 12.9  Neutrophil % 36.5  Lymphocyte % 47.5  Monocyte % 13.2  Eosinophil % 2.2  Basophil % 0.6  Neutrophil # 2.1   Lymphocyte # 2.8  Monocyte # 0.8  Eosinophil # 0.1  Basophil # 0.0 (Result(s) reported on 20 May 2013 at 05:17AM.)   Assessment/Plan:  Assessment/Plan:  Assessment Cholecystitis/gastritis. Do not feel gastritis account for pt's sxs right now.   Plan Agree with GB surgery later today. Continue protonix bid/carafate qid after surgery. Have patient f/u with CAlgis LimingNP after discharge. Pt has f/u EGD on 8/11. Will sign off. Thanks   Electronic Signatures: OVerdie Shire(MD)  (Signed 10-Jul-14 13:45)  Authored: Chief Complaint, VITAL SIGNS/ANCILLARY NOTES, Brief Assessment, Lab Results, Assessment/Plan   Last Updated: 10-Jul-14 13:45 by OVerdie Shire(MD)

## 2015-03-03 NOTE — Consult Note (Signed)
Chief Complaint:  Subjective/Chief Complaint seen for ruq pain.  patient with continued nausea, headache today, some increase ruq pain.   VITAL SIGNS/ANCILLARY NOTES: **Vital Signs.:   23-Jun-14 05:05  Vital Signs Type Routine  Temperature Temperature (F) 98.3  Celsius 36.8  Temperature Source oral  Respirations Respirations 18  Systolic BP Systolic BP 116  Diastolic BP (mmHg) Diastolic BP (mmHg) 77  Mean BP 90  Pulse Ox % Pulse Ox % 98  Pulse Ox Activity Level  At rest  Oxygen Delivery Room Air/ 21 %   Brief Assessment:  Cardiac Regular   Respiratory clear BS   Gastrointestinal details normal Soft  Nondistended  Bowel sounds normal  No rebound tenderness  markedly tender ruq to palpation   Assessment/Plan:  Assessment/Plan:  Assessment 1) ruq pain- continuing, clinically seems to be increased from yesterday.   2) h/o migraines and Marked nsaid use daily.   Plan 1) egd today.  I have discussed the risks benefits and complicatiosn of egd to include not limited to bleeding infection perforation and sedation and she wishes to proceed.  Further recs to follow.   Electronic Signatures: Barnetta ChapelSkulskie, Kyzer Blowe (MD)  (Signed 23-Jun-14 13:11)  Authored: Chief Complaint, VITAL SIGNS/ANCILLARY NOTES, Brief Assessment, Assessment/Plan   Last Updated: 23-Jun-14 13:11 by Barnetta ChapelSkulskie, Maleia Weems (MD)

## 2015-03-03 NOTE — Consult Note (Signed)
PATIENT NAME:  Amber Nolan, Amber Nolan MR#:  [id number] DATE OF BIRTH:  [birth year]  DATE OF CONSULTATION:  05/02/2013  CONSULTING PHYSICIAN:  Lollie Sails, MD  Patient of Dr. Bridgett Larsson.  REASON FOR CONSULTATION:  Right upper quadrant pain and fever.   HISTORY OF PRESENT ILLNESS: Miss Amber Nolan is a 34 year old Caucasian female who was admitted to the hospital on June 20. At that time, she presented with abdominal pain, nausea and vomiting that had been occurring over the previous 2 weeks, but increasing over several days prior to admission. She states that she feels like she has "had an infection for a while". She also has a history of recurrent recent bacterial vaginosis that has made her feel ill as well. She states that over the past couple of weeks, she has had increased fatigue as well as weakness. Some intermittent nausea. Over the past week or so, she has had achy feeling as well as a dull pain in the right upper quadrant. There has been some decreased appetite. Then over the past couple of days, these pains changed to more sharp pain that occurred more frequently and might have lasted a bit longer. She relates having some dysuria last Thursday night with some fever, and then pain seemed to radiate into her right side. She does have a history of constipation and variable bowel habits. She relates having 3 dark stools this past Wednesday that were loose. She states she has had previous similar symptoms in regards to the right upper quadrant pain and nausea, but not as bad as current. She has been having increased heartburn symptoms for about a month. She has been taking antacids for about a month as well. In further discussion with her, she states she does take ibuprofen on a daily basis, 4 tablets 2 to 3 times a day for migraine headaches. She has apparently done this for a long time, perhaps even years. She states she has 3 children. She states that her symptoms of the right upper quadrant pain of an  intermittent nature began between the births of her second and third child. She did have a CT scan about 2-1/2 years ago that indicated gallstones. There was no treatment at that time. She generally has a bowel movement about 2 to 3 times a week.   PAST MEDICAL HISTORY:  She has had a remote appendectomy as well as a bilateral tubal ligation after the birth of a third child. She has had recurrent urinary tract infections as well as some nephrolithiasis, as well as recurrent bacterial vaginosis.   SOCIAL HISTORY:  She does not drink or smoke or use illicit drugs.   GI FAMILY HISTORY:  Pertinent for mother, grandmother and aunt, all on her mother's side having gallbladder problems requiring cholecystectomy. She states her mother who is at about the same age that she is currently, when she had her gallbladder removed.   REVIEW OF SYSTEMS:  Ten systems reviewed, per admission history and physical. Agree with same.   OUTPATIENT MEDICATIONS:  Apparently she was not taking any other medications when she came into the hospital, though in the past she has a record of multiple courses of antibiotics. Please note the history of taking large doses of ibuprofen for long period of time.   ALLERGIES:  SULFA and CINNAMON.   PHYSICAL EXAMINATION: VITAL SIGNS: Temperature is 98.8, pulse 58, respirations 18, blood pressure 91/54, pulse ox 98%.  GENERAL:  She is a 34 year old Caucasian female in no acute distress.  HEENT:  Normocephalic, atraumatic.  Eyes are anicteric. Nose:  Septum midline. No lesions.  Oropharynx:  No lesions.  NECK:  Supple. No JVD. No lymphadenopathy. No thyromegaly.  HEART:  Regular rate and rhythm, without rub or gallop.  LUNGS:  Bilaterally clear.  ABDOMEN:  Soft. She is markedly tender to palpation in the upper right upper quadrant. This radiates somewhat toward the right lower quadrant as well as around to her right side. She also has some tenderness in the left lower quadrant. Bowel  sounds are positive, normoactive. There is no apparent organomegaly or masses felt. There is no rebound.  EXTREMITIES:  No clubbing, cyanosis or edema.  NEUROLOGICAL: Cranial nerves II through XII grossly intact. Muscle strength bilaterally equal and symmetric, 5 out of 5. DTRs bilaterally equal and symmetric.   LABORATORIES INCLUDE THE FOLLOWING:  On admission, she had a normal MET-B panel. This has remained normal over the course of her hospitalization, with the exception of a slightly low calcium today at 7.4. She has had one hepatic panel done, which was normal as well. Her hemogram on admission to the hospital showed a white count of 15.5, H and H 12.0/35.3, platelet count of 309, MCV 91. A repeat hemogram yesterday showed a white count of 14.2, H and H 10.7/31.3, platelet count of 278. She had blood cultures, no growth in 48 hours. She had a wet prep showing no trichomonas, but positive for yeast, many white cells. Chlamydia and Neisseria gonorrhea were negative. Urinalysis showed 5 white cells per high-power field and 2+ blood. She has had several imaging studies. She had a CT scan of the abdomen, unfortunately with a pelvic protocol for stones. This did show left-sided nephrolithiasis without hydronephrosis, as well as calcified gallstones. She showed a hepatobiliary study, which was unremarkable, was small bowel activity at 15 minutes, gallbladder activity at 10 minutes. She showed an abdominal ultrasound with cholelithiasis, largest stone being 2.2 cm, with a hepatic Riedel lobe. Common bile duct diameter is 3.0. No pericholecystic fluid or ascites.   ASSESSMENT: 1.  Right upper quadrant pain, associated with fever. Mild Murphy's sign noted. Evaluation for cholecystitis negative, with the exception of finding gallstones as above.  2.  Right upper quadrant some epigastric discomfort in the setting of chronic daily high-dose NSAID use.   RECOMMENDATIONS:  Recommend to proceed with EGD for luminal  evaluation. If this does show evidence of ulcers, would treat these, and may be of benefit in regards to the right upper quadrant epigastric discomfort. If the EGD is unremarkable, would consider following gallbladder closely in regards to surgical considerations.   I have discussed the risks, benefits, complications of EGD to include, but not limited to bleeding, infection, perforation, the risk of sedation. She wishes to proceed.   ____________________________ Lollie Sails, MD mus:mr D: 05/02/2013 20:24:00 ET T: 05/02/2013 21:24:00 ET JOB#: [id number]  cc: Lollie Sails, MD, <Dictator> Lollie Sails MD ELECTRONICALLY SIGNED 06/07/2013 7:11

## 2015-03-03 NOTE — Consult Note (Signed)
PATIENT NAME:  Amber Nolan, Amber Nolan MR#:  [id number] DATE OF BIRTH:  1981/01/18  DATE OF CONSULTATION:  05/19/2013  CONSULTING PHYSICIAN:  Rodman Keyawn S. Carzell Saldivar, NP  ATTENDING PHYSICIAN: Natale LayMark Bird.   REASON FOR CONSULTATION: Abdominal pain possible evaluation by EGD.  Consult is for Rodman Keyawn S. Amber Baez, FNP-BC as well as Dr. Lutricia FeilPaul Oh.   HISTORY OF PRESENT ILLNESS: The patient is a 34 year old Caucasian female who presented to [facility] Room yesterday evening for the concern of worsening abdominal pain which has been present intermittently for at least the past year and is only worsened over the past month. In fact, she was hospitalized 04/30/2013 to 05/04/2013. Abdominal pain is localized in right upper quadrant as well as epigastric with associated nausea and vomiting. During her last admission, she was seen by Dr. Barnetta ChapelMartin Skulskie as well as Vevelyn Pathristiane London, NP.  EGD was done on 05/03/2013, which showed severe inflammation, erosions, deep ulceration and shallow ulcerations found on incisors in the gastric atrium. She was discharged home with Protonix 40 mg twice a day, which she has been taking as well as sucralfate 1 gram 4 times a day. She was seen back in our office by Vevelyn Pathristiane London, NP on 05/12/2013. At that time actually, she was starting to feel better. Her medical history is significant for migraine headaches. She had been taking NSAID use, ibuprofen on a weekly basis of 2 to 3 times a week taking it several times within a 24-hour period up to 800 to 1000 mg at a time. Since the last discharge, she has avoided NSAIDs. She has minimized caffeine consumption. She has followed dietary recommendations. In lieu of all of this, her symptoms have actually worsened. Pain is always present to right upper quadrant and exacerbates with food associated nausea, vomiting, dry heaving at times. Usually becomes worse 20 to 30 minutes after eating. Also at times a change in bowel habits, experiencing some diarrhea.  Color of stool is yellow.  Fever yesterday evening was 100.3.   The patient was seen by Dr. Natale LayMark Bird, who requested a GI consultation for consideration of EGD being repeated. In reviewing imaging studies, an abdominal ultrasound was done on 04/30/2013, which revealed evidence of cholelithiasis with the largest stone being 2.2 cm. Negative sonographic Murphy's sign. Common bile duct measured 3.0 mm. Liver length was 18.68 cm. Appeared grossly normal.  On 05/19/2013, abdominal ultrasound was repeated. Evidence of cholelithiasis with positive sonographic Murphy's sign, which could be seen with acute cholecystitis, according to Dr. Myles RosenthalJeffrey Brown. No evidence of gallbladder wall thickening or pericholecystic fluid.   PAST MEDICAL HISTORY: Kidney stones. Urinary tract infection, migraine headaches.   PAST SURGICAL HISTORY:  Appendectomy at the age of 34. Tubal ligation 2013.   FAMILY HISTORY: Father with history of the liver cancer; states she was not very close to her biological dad, deceased in his 5940s, not able to elaborate further with details. Mother with history of liver cancer, deceased at the age of 35, known history of a fatty liver which is presumed that it progressed to cirrhosis. She had congestive heart failure as well. Sister history of irritable bowel syndrome. No family history of IBD or celiac disease.   SOCIAL HISTORY: No tobacco, very rare social alcohol use. Married. Three children, ages 6612, 804 and 517 months of age.  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   CURRENT MEDICATIONS: 1. Acetaminophen/butalbital/caffeine 325 mg/50 mg/40 mg 1 tablet every 4 hours. 2. Sucralfate 1 gram 4 times a day. 3.  Protonix 40 mg twice a day.  4. Acetaminophen with hydrocodone 5 mg/325 mg 1 tablet every 4 hours.   ALLERGIES: CINNAMON, SULFA.   REVIEW OF SYSTEMS:  All 10 systems reviewed and checked, otherwise unremarkable other than what is stated above.   PHYSICAL EXAMINATION:  VITAL SIGNS:  Temperature 98.6, heart rate is a 70, respirations 16, blood pressure 100/66, pulse oximetry is 99% on room air.   GENERAL: Well-developed, well-nourished, 34 year old, Caucasian female, no acute distress noted. Though she does appear ill. Holding her right side of abdomen.   HEENT: Normocephalic, atraumatic. Pupils equal, reactive to light. Conjunctivae clear. Sclerae anicteric.   NECK: Supple. Trachea midline. No lymphadenopathy or thyromegaly.   PULMONARY: Symmetric rise and fall of chest. Clear to auscultation throughout.   CARDIOVASCULAR: Regular rate and rhythm, S1, S2. No murmurs, no gallops.   ABDOMEN: Soft. Hypoactive bowel sounds. Marked discomfort epigastric right upper quadrant. Positive Murphy's sign. No evidence of hepatosplenomegaly. No masses. No bruits.   RECTAL: Deferred.   MUSCULOSKELETAL: Moving all four extremities. No contractures. No clubbing.   EXTREMITIES: No edema.   SKIN: Warm, dry. No lesions. No masses.   NEUROLOGICAL: No gross neurological deficits.   LABORATORY/DIAGNOSTIC: Imaging studies as noted under history. Chemistry panel glucose was 101. Potassium was low at 3.3, otherwise within normal limits. Hepatic panel within normal limits except AST low at 14. CBC WBC count was elevated at 12.5, otherwise within normal limits. Urinalysis blood, +1 leukocytes, +1 bacteria, +1 negative pregnancy test.   IMPRESSION:  Chronic abdominal pain to the right upper quadrant as well as epigastric region. Has exacerbated over the past month and has been present for over a year even probably 2 years prior to her last pregnancy. Abdominal ultrasound show evidence of cholelithiasis. EGD done 05/04/2013 showed severe inflammation, erosions, deep ulcerations and shallow ulcerations found incisors and gastric atrium.  Hypokalemia.   PLAN: The patient's presentation was discussed with Dr. Lutricia Feil. The patient has been seen by Dr. Natale Lay, who recommends EGD be repeated based on  previous findings. The patient has  been faithful with taking Protonix 40 mg twice a day as well as Carafate 1 gram q.i.d. She has avoided NSAID therapy since her last admission, 05/02/2013. Only 1 cap needed to drink daily following the diet. She has been doing much better, when she was seen by Sharlotte Alamo, NP on 05/12/2013 in our office. Exacerbation of symptoms again with associated fever yesterday. Prior to proceeding with esophagogastroduodenoscopy, Dr. Bluford Kaufmann is going to speak with Dr. Natale Lay.  She is to continue on clear liquid diet and current medications as ordered specifically PPI therapy as well as Carafate. Will continue to follow. Depending on discussion with Dr. Egbert Garibaldi, the patient will be placed on n.p.o. status after midnight tonight in preparation of possible EGD tomorrow.   Thank you for allowing [place] to participate in the care of this patient, under collaboration with Dr. Lutricia Feil.   These services provided by Owens Shark, NP under collaborative agreement with Dr. Lutricia Feil.   ____________________________ Rodman Key, NP dsh:rw D: 05/19/2013 13:54:02 ET T: 05/19/2013 15:54:46 ET JOB#: [id number]  cc: Rodman Key, NP, <Dictator> Rodman Key MD ELECTRONICALLY SIGNED 05/25/2013 8:17

## 2015-03-03 NOTE — Discharge Summary (Signed)
PATIENT NAME:  Amber Nolan, Amber Nolan MR#:  [id number] DATE OF BIRTH:  [birth year]  DATE OF ADMISSION:  05/19/2013 DATE OF DISCHARGE:  05/22/2013  FINAL DIAGNOSES:  1.  Biliary colic.  2.  Cholelithiasis.  3.  History of gastritis.  4.  Urinary tract infection.  PRINCIPAL PROCEDURES: Laparoscopic cholecystectomy with intraoperative cholangiography on 07/10. Gastroenterology consultation featuring Dr. Bluford Kaufmannh.   HOSPITAL COURSE SUMMARY: The patient was admitted with recurrent abdominal pain. She was scheduled for laparoscopic cholecystectomy that evening; however, due to scheduling issues, this was delayed until the 10th. GI medicine did see her and thought that repeat EGD would not be of any benefit. They felt that her pain was more than likely biliary colic. Postoperatively, the patient did well and was discharged home on postoperative day #1 tolerating a regular diet on oral pain medications. She will followup in my office in approximately 7 to 10 days.   DISCHARGE MEDICATIONS: Carafate 1 tab 4 times a day, Protonix 40 mg by mouth b.i.d., Norco 5/325 1 tab every 6 hours as needed for pain, Cipro 500 mg by mouth every 12 hours for 5 days for urinary tract infection.  ____________________________ Redge GainerMark A. Egbert GaribaldiBird, MD mab:aw D: 05/30/2013 20:24:00 ET T: 05/31/2013 08:04:57 ET JOB#: [id number]  cc: Loraine LericheMark A. Egbert GaribaldiBird, MD, <Dictator> Raynald KempMARK A Pryor Guettler MD ELECTRONICALLY SIGNED 06/04/2013 15:30

## 2015-03-03 NOTE — Op Note (Signed)
PATIENT NAME:  Amber Nolan, Amber Nolan MR#:  [id number] DATE OF BIRTH:  1980-11-18  DATE OF PROCEDURE:  05/20/2013  PREOPERATIVE DIAGNOSIS: Symptomatic cholelithiasis and chronic cholecystitis.   POSTOPERATIVE DIAGNOSIS: Symptomatic cholelithiasis and chronic cholecystitis.   PROCEDURE PERFORMED: Laparoscopic cholecystectomy with intraoperative cholangiography.   SURGEON: Natale LayMark Mikel Pyon M.D.   ASSISTANT: Surgical scrub technologist.  TYPE OF ANESTHESIA: General oral endotracheal, Dr. Janee Mornhompson and associates.   FINDINGS: Stones, normal cholangiogram, normal-appearing liver. No inflammatory changes seen around the stomach or duodenum. Liver was normal. The peritoneum was normal. Appendix is surgically absent. There were some adhesions in the right lower quadrant, which were minimal and trivial.   ESTIMATED BLOOD LOSS: Minimal.   DESCRIPTION OF PROCEDURE: With the patient in the supine position, general oral endotracheal anesthesia was induced. The patient's abdomen was widely prepped and draped with ChloraPrep solution. The left arm was padded and tucked at her side. Timeout was observed. A 12 mm blunt Hassan trocar was placed through an infraumbilical transversely oriented skin incision with stay sutures being passed through the fascia. Pneumoperitoneum was established. The patient was then positioned in reverse Trendelenburg and airplane right side up. A 5 mm Bladeless trocar was placed in the epigastric region. Two 5 mm balloon trocars were then placed in the right lateral subcostal margin. The gallbladder was identified in the right upper quadrant and appeared normal in appearance. There were some adhesions of the omentum to the body, which were taken down with blunt technique. The gallbladder was grasped along its fundus and elevated towards the right shoulder. Lateral traction was applied on Hartman's pouch. The hepatoduodenal ligament was then dissected utilizing critical view of safety cholecystectomy.  Cystic artery was doubly clipped on the portal side, singly clipped on the gallbladder side immediately adjacent to the pericystic lymph node. The cystic duct was identified. The peritoneum both medially and laterally along the gallbladder fossa were taken down with electrocautery. Kumar cholangiogram catheter clamp was then placed across the Hartman's pouch. Hartman's pouch was intubated with needle catheter. Cholangiography was performed demonstrating no filling defects. Normal pancreatic duct. The catheter was then removed. Cystic duct was triply clipped on the portal side, singly clipped on the gallbladder side and divided. The gallbladder was then easily retrieved off the gallbladder fossa utilizing hook cautery apparatus, placed into an Endo Catch device and retrieved. During retrieval process, a 5 mm camera was used in the epigastric region and there was no evidence of bowel injury from the trocar insertion site or at the umbilicus.   With hemostasis ensured on the operative field, after application of a brief amount of  point electrocautery in the gallbladder fossa, ports were then removed under direct visualization following irrigation with 1 L of warm normal saline. No evidence of bile leakage. Ports were then removed under direct visualization. A total of 30 mL of 0.25% Sensorcaine was infiltrated along all skin and fascial incisions prior to closure. The umbilical fascial defect was reapproximated with an additional figure-of-eight #0 Vicryl suture in vertical orientation. The existing stay sutures were tied to each other. 4-0 Vicryl subcuticular was applied to all skin edges followed by Benzoin, Steri-Strips, Telfa, and Tegaderm. The patient was then subsequently extubated and taken to the recovery room in stable and satisfactory condition by anesthesia services.   ____________________________ Redge GainerMark A. Egbert GaribaldiBird, MD mab:aw D: 05/20/2013 21:17:09 ET T: 05/21/2013 06:20:37 ET JOB#: [id number]  cc: Loraine LericheMark  A. Egbert GaribaldiBird, MD, <Dictator> Jasmen Emrich A Dyan Labarbera MD ELECTRONICALLY SIGNED 05/23/2013 21:58

## 2015-03-03 NOTE — Consult Note (Signed)
Pt seen and examined. Please see Dawn Harrison's notes. Signif gastritis 2 wks ago. Has been on protonix bid and carafate qid and avoiding NSAIDS/caffeine since. Family hx of GB disease. U/S show gallstones. Do not feel repeating EGD is necessary. EGD findings unlike to change much in 2 wks. Even though PUD can cause abd pain, unlikely to cause fever or elevated WBC. Pt fairy tender in RUQ area. Suspect most of the sxs are more due to GB. If surgery strongly feels repeat EGD is warrented, will repeat EGD tomorrow afternoon. Otherwise, would proceed with GB surgery. Thanks.  Electronic Signatures: Lutricia Feilh, Ulisses Vondrak (MD)  (Signed on 09-Jul-14 15:33)  Authored  Last Updated: 09-Jul-14 15:33 by Lutricia Feilh, Ladawn Boullion (MD)

## 2015-03-03 NOTE — Consult Note (Signed)
PATIENT NAME:  Amber Nolan, Amber Nolan MR#:  [id number] DATE OF BIRTH:  07/10/1981  DATE OF CONSULTATION:  04/30/2013  REASON FOR CONSULTATION: Right upper quadrant pain x 2 to 3 weeks, worsening nausea, low-grade fevers, rigors and leukocytosis.   HISTORY OF PRESENT ILLNESS: Ms. Amber Nolan is a pleasant 34 year old female who has a history of recurrent BV and pyelonephritis who presents with approximately 1 month of malaise and worsening right-sided abdominal pain. She says that her pain began 2 to 3 weeks ago, that it was similar to what she had previously had with her recurrent BV which she does not treat aggressively initially and ends up taking Flagyl when it comes too bad.  However, this time pain got much over the last week. She says that it is in her right lower abdomen.  However, later on physical examination it appears in her right upper rectum. She has also had some nausea, but no vomiting Has normal bowel movements. She has had low-grade fever since here, but also rigors.  With her BV she normally has is worse on the right side. She also has a history of fibroids and  pyelonephritis. Says that now she feels pressure with urination as well. Otherwise, no weight loss, chest pain, shortness of breath, cough.  No obvious dysuria, just pressure. No hematuria.   PAST MEDICAL HISTORY: Is as follows: 1.  Kidney stones.  2.  Urinary tract infection.  3.  Recurrent BV. 4.  Sinus surgery.  5.  Appendiceal surgery at age 34.  6.  Fibroids.   PAST SURGICAL HISTORY:  Laparoscopic tubal ligation approximately 1 year ago.  CURRENT HOME MEDICATIONS: None.   ALLERGIES: SINEMET AND SULFA DRUGS. HOWEVER, SHE HAS TAKEN FLAGYL IN THE PAST.    SOCIAL HISTORY: Denies tobacco use. Does support social alcohol use.  Denies any other drug use.   FAMILY HISTORY: Mom with cervical cancer.  Father with liver cancer.  Family history of hypertension and diabetes. No history of MIs.   PHYSICAL EXAMINATION: VITAL SIGNS:  Temperature 101.1, pulse 80, blood pressure 119/69, respirations 20, 97% on room air.  GENERAL: No acute distress. Alert and oriented x 3.  HEAD: Normocephalic, atraumatic.  EYES: No scleral icterus. No conjunctivitis.  FACE: No obvious facial trauma. Normal external nose. Normal external ear.  HEART:  Regular rate and rhythm.  No murmurs, rubs or gallops. ABDOMEN: Soft. Tender in right upper quadrant, non-peritonitic, nondistended. Normal bowel sounds.  EXTREMITIES: Strength 5 out of 5 in all 4 extremities.  NEUROLOGIC:  Sensation intact in all 4 extremities.  Cranial nerves II through XII grossly intact.   LABORATORY AND DIAGNOSTICS: Are as follows: Significant for a white cell count of 15.5, hemoglobin 12, hematocrit 35.3, platelets of 309. LFTs are normal. Lipase 71. BMP is normal.  No clue cells or trich seen on wet prep.  Urinalysis shows no leukocyte esterase, no nitrite.   Ultrasound shows gallstones with no pericholecystic fluid. No gallbladder wall thickening.   CT shows gallstones as well. No obvious appendix. No obvious pericholecystic fluid. No obvious gallbladder wall thickening.   ASSESSMENT AND PLAN: Ms. Amber Nolan is a 34 year old female with weeks of worsening right upper quadrant pain with rigors and leukocytosis. Although I believe her pain could be potentially due to gallstones, I do not believe if she had cholecystitis, which would be more suggestive of with the leukocytosis and her low-grade fever, I would expect more significant changes to her gallbladder on ultrasound and on CT scan.  I am  sure of the etiology of her pain right now. We will continue to follow with you. May consider HIDA scan if pain continues, although, as I mentioned, would expect more obvious radiographic signs if has cholecystitis.  ____________________________ Si Raider. Sahily Biddle, MD cal:sb D: 04/30/2013 15:15:53 ET T: 04/30/2013 15:57:17 ET JOB#: [id number]  cc: Cristal Deer A. Shawntell Dixson, MD,  <Dictator> Jarvis Newcomer MD ELECTRONICALLY SIGNED 05/01/2013 17:26

## 2015-03-03 NOTE — Consult Note (Signed)
Brief Consult Note: Diagnosis: Chronic abdominal pain to right upper quadrant as well as epigastric region.  Associated nausea and vomiting.  Abdominal ultrasound reveals evidence of cholethiasis.  EGD done 05/04/2013 showed severe inflammation and erosions with deep ulcerations and shallow ulcerations found incisors in gastric antrum.   Discussed with Attending MD.   Comments: Patient's presentation discussed with Dr. Bluford Kaufmannh.  Patient has been seen by Dr. Loura BackMark Byrd who recommends EGD being repeated based on previous findings.  Patient has been faithful with taking Protonix 40 mg po bid as well as Carafate 1 gm po qid.  She has avoid NSAID therapy since last admission 04/30/2013.  Only one caffeinated drink daily. Following diet.  She was doing better when seen by Vevelyn Pathristiane London, NP on May 12, 2013 in our office.  Exacerbation of symptoms again with associated fever yesterday.  Prior to proceeding with EGD, Dr. Bluford Kaufmannh is going to see patient and speak with Dr. Kandice HamsBryd.  She is to continue on clear liquid diet and current medications as ordered.  Will continue to follow.  Depending on discussion with Dr. Kandice HamsBryd, patient will be placed NPO after midnight tonight in preparation of possible EGD tomorrow..  Electronic Signatures: Rodman KeyHarrison, Edmundo Tedesco S (NP)  (Signed 09-Jul-14 13:54)  Authored: Brief Consult Note   Last Updated: 09-Jul-14 13:54 by Rodman KeyHarrison, Kriste Broman S (NP)

## 2015-03-03 NOTE — Consult Note (Signed)
Chief Complaint:  Subjective/Chief Complaint Patietn seen and examined, full consult dictated. Patient presenting with fever adn elevated wbc, ruq pain.  Positive large gallstones, though no evidence of cholecystitis.  Patient has a history of chronic nsaid use, IBP 4 tabs 2-3 times a day for  "a long time"  raising consideration of PUDz.  Will proceed with egd tomorrow.  I have discussed wht risks benefits and complicatiosn of egd to include not limited to bleeding infection perforation and sedation and she wishes to proceed.  will hold IBP and continue po ppi.   VITAL SIGNS/ANCILLARY NOTES: **Vital Signs.:   22-Jun-14 14:43  Vital Signs Type Routine  Temperature Temperature (F) 97.9  Celsius 36.6  Temperature Source oral  Pulse Pulse 59  Respirations Respirations 18  Systolic BP Systolic BP 106  Diastolic BP (mmHg) Diastolic BP (mmHg) 69  Mean BP 81  Pulse Ox % Pulse Ox % 98  Pulse Ox Activity Level  At rest  Oxygen Delivery Room Air/ 21 %   Electronic Signatures: Barnetta ChapelSkulskie, Bekah Igoe (MD)  (Signed 22-Jun-14 20:30)  Authored: Chief Complaint, VITAL SIGNS/ANCILLARY NOTES   Last Updated: 22-Jun-14 20:30 by Barnetta ChapelSkulskie, Estephan Gallardo (MD)

## 2015-03-03 NOTE — Op Note (Signed)
PATIENT NAME:  Amber Nolan, Amber Nolan MR#:  [id number] DATE OF BIRTH:  [birth year]  DATE OF PROCEDURE:  08/16/2013  PREOPERATIVE DIAGNOSES: 1.  Abdominal pain, with acute abdomen.  2.  Evidence of hemoperitoneum on CT and ultrasound.   POSTOPERATIVE DIAGNOSES: 1.  Abdominal pain, with acute abdomen.  2.  Evidence of hemoperitoneum on CT and ultrasound.  3.  Bleeding right ovarian cyst.   SURGERY: 1.  Diagnostic laparoscopy.  2.  Evacuation of hemoperitoneum.  3.  Cauterization of bleeding ovarian serosa.   SURGEON: Doryan Bahl.   ESTIMATED BLOOD LOSS: Minimal in the case, but the hematoma and pneumoperitoneum was slightly in excess of 1000 mL evacuated.   DRAINS: In-and-out catheterization at the beginning of the case; approximately 250 mL of slightly darkened urine.   COMPLICATIONS: None.   SPECIMENS: None.   PROCEDURE IN DETAIL: The patient presented to the ER here with an acute onset of abdominal pain. She had positive peritoneal signs. A CT showed evidence of hemoperitoneum, as did ultrasound. No dominant etiology was noted; the patient had irregular cycles. Pregnancy test was negative. It was presumed she had a ruptured ovarian cyst.   Discussed laparoscopy, including technique, antic. pre and  postoperative course. The risks (including, but not limited to bleeding, infection, bowel, bladder, or other injuries, possible extension of  procedure, reoperation, laparotomy associated with same, and consent was signed. She agreed to proceed.  The patient was taken to the operating room where anesthesia was initiated and placed in the dorsal lithotomy position using Allen stirrups. She was sterilely prepped and draped in the usual sterile fashion. An 11-port was placed infraumbilically to establish a pneumoperitoneum, with hemoperitoneum noted. Very slight Trendelenburg was requested and the 5 mm port was placed in the left lower quadrant and evacuated of non-clotted blood. At this point steep  reverse Trendelenburg was requested and a 10 mm suction curette was placed, taking care to avoid bowel, evacuating larger clots.   At this point we investigated the adnexa. It was found that the right ovary had some steady trickle from what appeared to be the dependent portion of the dominant cyst. The area was cauterized. Cyst was punctured with Kleppingers and then the anterior of the cyst was cauterized as well. The patient was placed in reverse Trendelenburg. More blood was evacuated. The patient was returned to Trendelenburg. Areas were copiously irrigated as pressure was lowered to 6 mmHg. The areas were again seen to be hemostatic. At this point the procedure was felt to have achieved maximum efficacy. Ports were removed and pneumoperitoneum was resolved. The incision was closed with deep subcu 0, subcutaneous with 3-0 Vicryl, Steri-Strips and Band-Aids were placed.   All instrument, needle, and sponge counts were correct. Please note to I did place a Hulka tenaculum with the sound through the cervical os, and these were Steri-Stripped together to aid in uterine manipulation. That was removed at the end of the case.   The patient will be observed overnight and will be watched for signs and symptoms of anemia and recurrent bleeding.     ____________________________ Reatha Harpsicky L. Logan BoresEvans, MD rle:dm D: 08/16/2013 12:30:58 ET T: 08/16/2013 12:58:31 ET JOB#: [id number]  cc: Ricky L. Logan BoresEvans, MD, <Dictator> Augustina MoodICK L Tzipora Mcinroy MD ELECTRONICALLY SIGNED 08/16/2013 19:31

## 2015-03-03 NOTE — H&P (Signed)
Subjective/Chief Complaint abdominal pain   History of Present Illness 34 y/o otherwise healthy female recently discharged from hospital with now a month worth of near constant upper abdominal pain.  Workup during last admission felt to be secondary to NSAID induced PUD/gastritis.  That work-up included CT scan, [place] HIDA and EGD.  She relates a long history prior to about a month ago of intermittent abdominal pain mainly occuring in a post-prandial fashion with fatty foods.  She has always attributed this type of discomfort to kidney stones or UTI's.  During the last month her pain has been centered in epigastrum and RUQ associated with nausea and loose stools along with low grade fevers.  Today she states she has not eaten in 3 days and has had worsening RUQ abdominal pain and low grade temp to 100.3.  Repeated US in ER shows cholelithiasis no ductal dilation or pericholecystic fluid.  She has stopped all NSAIDS since discharge in late June of this year.  She was seen in follow-up on July 2nd in GI clinic with persistent and similar symptoms as upon discharge and at present.   Past History migraine headaches lap tubal ligation 2013 3 children ages 68, 3 and 1.   Past Med/Surgical Hx:  kidney stones:   UTI:   None, patient reports no medical history.:   sinus surgery:   ALLERGIES:  cinnamon: Headaches, Hives  cinnamon: Headaches, Itching  Sulfa drugs: Other  HOME MEDICATIONS: Medication Instructions Status  acetaminophen/butalbital/caffeine 325 mg-50 mg-40 mg oral tablet 1 tab(s) orally every 4 hours, As needed, headache Active  sucralfate 1 g oral tablet 1 tab(s) orally 4 times a day (after meals and at bedtime) Active  pantoprazole 40 mg oral delayed release tablet 1 tab(s) orally 2 times a day Active  acetaminophen-HYDROcodone 325 mg-5 mg oral tablet 1 tab(s) orally every 4 hours, As needed, pain Active   Family and Social History:  Family History Non-Contributory   Social History  negative tobacco, negative Illicit drugs   Place of Living Home   Review of Systems:  Subjective/Chief Complaint see above   Physical Exam:  GEN no acute distress, disheveled   HEENT pale conjunctivae, PERRL, no scleral icterus.   NECK supple   RESP normal resp effort   CARD regular rate   ABD positive tenderness  denies Flank Tenderness  no liver/spleen enlargement  no hernia  soft  normal BS   LYMPH negative neck   EXTR negative cyanosis/clubbing   SKIN normal to palpation, No rashes   NEURO cranial nerves intact   PSYCH A+O to time, place, person, good insight   Lab Results:  Hepatic:  08-Jul-14 20:06   Bilirubin, Total 0.4  Alkaline Phosphatase 74  SGPT (ALT) 21  SGOT (AST)  14  Total Protein, Serum 7.8  Albumin, Serum 3.9  Routine Chem:  08-Jul-14 20:06   Glucose, Serum  101  BUN 11  Creatinine (comp) 0.73  Sodium, Serum 138  Potassium, Serum  3.3  Chloride, Serum 103  CO2, Serum 26  Calcium (Total), Serum 8.9  Osmolality (calc) 275  eGFR (African American) >60  eGFR (Non-African American) >60 (eGFR values <38m/min/1.73 m2 may be an indication of chronic kidney disease (CKD). Calculated eGFR is useful in patients with stable renal function. The eGFR calculation will not be reliable in acutely ill patients when serum creatinine is changing rapidly. It is not useful in  patients on dialysis. The eGFR calculation may not be applicable to patients at the low  and high extremes of body sizes, pregnant women, and vegetarians.)  Anion Gap 9  Lipase 90 (Result(s) reported on 18 May 2013 at 08:33PM.)  Routine UA:  08-Jul-14 20:06   Color (UA) Yellow  Clarity (UA) Hazy  Glucose (UA) Negative  Bilirubin (UA) Negative  Ketones (UA) Trace  Specific Gravity (UA) 1.025  Blood (UA) 1+  pH (UA) 6.0  Protein (UA) 30 mg/dL  Nitrite (UA) Negative  Leukocyte Esterase (UA) 1+ (Result(s) reported on 18 May 2013 at 08:42PM.)  RBC (UA) 8 /HPF  WBC (UA) 34 /HPF   Bacteria (UA) 1+  Epithelial Cells (UA) 15 /HPF  Mucous (UA) PRESENT (Result(s) reported on 18 May 2013 at 08:42PM.)  Routine Sero:  08-Jul-14 20:06   Pregnancy Test, Urine NEGATIVE (The results of the qualitative urine HCG (Pregnancy Test) should be evaluated in light of other clinical information.  There are limitations to the test which, in certain clinical situations, may result in a false positive or negative result. Thehigh dose hook effect can occur in urine samples with extremely high HCG concentrations.  This effect can produce a negative result in certain situations. It is suggested that results of the qualitative HCG be confirmed by an alternate methodology, such as the quantitative serum beta HCG test.)  Routine Hem:  08-Jul-14 20:06   WBC (CBC)  12.5  RBC (CBC) 3.93  Hemoglobin (CBC) 12.1  Hematocrit (CBC) 35.7  Platelet Count (CBC) 328 (Result(s) reported on 18 May 2013 at 08:23PM.)  MCV 91  MCH 30.8  MCHC 34.0  RDW 13.4   Radiology Results: [place]:    20-Jun-14 12:06, US Abdomen Limited Survey  US Abdomen Limited Survey  REASON FOR EXAM:    ruq pain, n/v  COMMENTS:   Body Site: GB and Fossa, CBD, Head of Pancreas    PROCEDURE: [place]  - US ABDOMEN LIMITED SURVEY  - Apr 30 2013 12:06PM     RESULT: Limited right upper quadrant abdominal sonogram shows 2 large   stones in the gallbladder. These are mobile with changes in patient   position. There is a negative sonographic Murphy's sign. Gallbladder wall   thickness is 2.0 mm. The common bile but diameter is 3.0 mm. The liver   length is 18.68 cm. The echotexture of the liver appears to be grossly   normal. There is no pericholecystic fluid or ascites. The portal venous   flow is normal. There is poor visualization of the pancreatic head. The   visualized portion of the pancreas appears unremarkable.    IMPRESSION:  Cholelithiasis. The largest stone is 2.2 cm. Hepatic Riedel     lobe demonstrated. Limited  visualization of the pancreas. No evidence of   acute cholecystitis.    Dictation Site: 2        Verified By: Sundra Aland, M.D., MD  LabUnknown:  PACS Image    20-Jun-14 13:51, CT Abdomen Pelvis WO for [facility]  PACS Image  CT:  CT Abdomen Pelvis WO for Stone  REASON FOR EXAM:    right flank pain, fever, chills  COMMENTS:       PROCEDURE: CT  - CT ABDOMEN /PELVIS WO (STONE)  - Apr 30 2013  1:51PM     RESULT: Comparison: 03/30/2010    Technique: Multiple axial images from the lung bases to the symphysis   pubis were obtained without oral and without intravenous contrast.    Findings:    Lack of intravenous contrast limits evaluation of the solid abdominal  organs. Grossly, the liver, spleen, adrenals, and pancreas are   unremarkable. Calcified stones are seen the gallbladder. There is a 6 mm     calculus in the left kidney. No hydronephrosis. No ureterectasis.    The small and large bowel are normal in caliber. The appendix is not   definitely visualized. However, there are no inflammatory changes in the   right lower quadrant.    No aggressive lytic or sclerotic osseous lesions are identified. There is   a dextrocurvature of the lumbar and lower thoracic spine.    IMPRESSION:   1. Left-sided nephrolithiasis, without hydronephrosis.  2. Cholelithiasis.        Verified By: Gregor Hams, M.D., MD  Nuclear Med:    21-Jun-14 11:44, Hepatobiliary Image - Nuc Med  Hepatobiliary Image - Nuc Med  REASON FOR EXAM:    RUQ pain  COMMENTS:       PROCEDURE: NM  - NM HEPATOBILIARY IMAGE  - May 01 2013 11:44AM     RESULT:     Procedure: Nuclear Medicine hepatobiliary evaluation was performed status   post intravenous administration of 8.33 mCi of technetium 76mlabeled   Choletec. Standard scintigraphic imaging of the abdomen was obtained.    Findings: Diffuse radiotracer activity is identified within the liver.   Mild common bile duct excretion is appreciated at 5  minutes with   increased activity throughout the study. Gallbladder activity is   identified at 10 minutes with increased intensity appreciated through the     remainder of the study. Small bowel activity is appreciated at 15 minutes   and radiotracer and peristalsis appreciated.    IMPRESSION:  Unremarkable nuclear medicine hepatobiliary evaluation.    Thank you for the opportunity to contribute to the care of your patient.         Verified By: HMikki Santee M.D., MD    Assessment/Admission Diagnosis 34y/o female with now 1 month abdominal pain-  ? biliary colic with associated documented NSAID induced PUD Current symptom complex and past workup not consistent with acute cholecystitis. Parts of her story are consistent with biliary colic in my opinion.   Plan admit, clears, will ask GI to re-evaluate for repeat EGD f/u [place]. bid PPI and carafate. total time spent 45 minutes.   Electronic Signatures: BSherri Rad(MD)  (Signed 09-Jul-14 04:32)  Authored: CHIEF COMPLAINT and HISTORY, PAST MEDICAL/SURGIAL HISTORY, ALLERGIES, HOME MEDICATIONS, FAMILY AND SOCIAL HISTORY, REVIEW OF SYSTEMS, PHYSICAL EXAM, LABS, Radiology, ASSESSMENT AND PLAN   Last Updated: 09-Jul-14 04:32 by BSherri Rad(MD)

## 2015-03-04 NOTE — Discharge Summary (Signed)
PATIENT NAME:  Amber NapoleonMADDOX, Shantella E MR#:  [id number] DATE OF BIRTH:  01/21/1981  DATE OF ADMISSION:  05/29/2014 DATE OF DISCHARGE:  05/31/2014  REASON FOR ADMISSION:  Suicidal attempt by overdosing on a handful of medications.  DISCHARGE DIAGNOSES: AXIS I:  Major depressive disorder, recurrent. AXIS II:  Deferred. AXIS III:  REM sleep disorder, bradycardia, migraine headaches. AXIS IV:  Mother's death anniversary, best friend's death anniversary.  Current worsening of health condition. AXIS V:  Global assessment function of 50.  DISCHARGE MEDICATIONS:  Sertraline 100 mg p.o. daily, mirtazapine 15 mg p.o. at bedtime, Topamax 150 mg once daily for migraine headaches, phenazopyridine 100 mg 3 times a day for urinary tract infection, Macrobid 100 mg b.i.d., aspirin 81 mg delayed release once daily, valacyclovir 500 mg q. 12 hours.    MEDICATIONS DISCONTINUED:  Xanax 0.25 mg 3 times a day as needed, tizanidine 8 mg every 8 hours as needed.  HOSPITAL COURSE:  The patient is a 34 year old married white female who came to the Emergency Department, brought in by her family after she attempted to overdose on a handful of medications.  The patient states that she has been overwhelmed because of worsening of her health condition.  She explains that she has been having bradycardia.  She is following up with a cardiologist.  She also has severe migraines.  She is developing similar symptoms as to what her mother's symptoms were.  Her mother passed away when the patient was in her early 1020s.  Her mother was a Engineer, civil (consulting)nurse and had an enlarged heart and congestive heart failure.  Her mother refused to seek any medical attention and passed away.  The patient is now fearful of developing the same medical issues that her mother had in passing away at an early age as well.  The patient also tells me that her mother passed away 10 years ago on July the 20th.  The anniversary is always difficult for the patient and this worsens her  mood every year.  Also, when the patient was in high school, her best friend killed himself and she was the last person to be contacted by her friend. This also happened in July and this also makes the month of July very difficult for the patient.  The patient denies any history of drug addition or alcohol use.  She states that when she was a teenager, she abused prescription medications for a short period of time.  However, she never developed addition and was never sent for treatment.  The patient is not currently following with outpatient psychiatric services.  The day prior to her presentation to the Emergency Department she did seek help due to worsening depression.  The patient had a walk-in evaluation at [facility].  There, she was advised to be hospitalized, but the patient declined.    The following day, the patient felt overwhelmed and this is when she locked herself into the bathroom and was about to swallow a handful of medications.  However, her 35-year-old knocked on the door and went in and came into the bathroom and this stopped her.  The patient then told her husband what she was attempting to do and she willingly came to the Emergency Department requesting help.  This is her first psychiatric hospitalization and also her first suicidal attempt.    The patient works as a housewife.  She is happily married and is the mother of 5 children.  The patient has a supportive family who has been willing  to help her in any way possible.  Collateral information was obtained from the patient's husband who confirmed the patient's story and did not have any concerns about the patient being discharged back to the home.  He agreed with the recommendations given in treatment and with the followup plan.  During her stay in the hospital, the patient was continued on sertraline 100 mg which it is prescribed by the patient's primary care provider.  No changes were made on this medication.  The patient complained of having  worsening mood with addition of clonazepam 0.25 mg q.h.s. given by her primary care provider or insomnia and for her REM sleep disorder.  This medication was discontinued and instead, the patient was started on mirtazapine 15 mg p.o. at bedtime to address insomnia and depression.  Prior to admission, the patient had been diagnosed with UTI and was given Macrobid and Pyridium and these medications were continued during her hospital stay and the patient was advised to continue the regimen as indicated by her primary care provider. (To complete 14 days).  The patient has a history of migraines.  She was continued on Topamax.  This was an uneventful hospitalization.  The patient did not require any restraints or seclusion.  There were no behavioral issues.  The patient was pleasant and cooperative and participated in groups during her stay in her unit.  Social worker made arrangements for the patient to return to [facility] for psychiatric followup.  The patient received education about the risks and side effects of mirtazapine and patient voiced understanding.  At the time of the discharge, it was felt that the patient was at low risk for suicide, as the patient was no longer suicidal.  Her mood was improved.  She is married, has children, does not have any access to weapons or firearms.  The patient does not use alcohol or drugs.  The patient did not have any psychotic symptoms.  The patient does not have a family history of suicide.  She does not have any legal history and does not have chronic medical conditions that increase the risk for suicide.  Chronic risk for suicide, imbalance also are low at this point.  LABORATORY RESULTS:  During the patient's admission, BUN 13, creatinine 0.82.  GFR above 60.  Alcohol level at admission was below detection limit.  AST was 18, ALT was 20.  Urine toxicology screen was positive for barbiturates.  The patient claims it was due to Fioricet that she takes p.r.n. for migraines.  TSH was checked and the result was 0.121.  This is below normal and needs to be followed up by her primary care provider.  Her WBC is 10.9, hemoglobin is 13.4, hematocrit is 39.5, platelet count is 289,000.  Urine screen shows positive nitrites, negative leukocyte esterase.  Pregnancy test was negative.  Urine pregnancy test was negative.  FINAL RECOMMENDATIONS FOR FOLLOWUP:  The patient was advised to continue following up with primary care provider and to have TSH and thyroid function rechecked to rule out any thyroid abnormalities.  The patient was also advised to continue following up with cardiology as her heart rate during her admission did go as low as 48.  The patient was advised to try the mirtazapine to see if there is any effect in her sleep and mood and to discuss further changes with her outpatient provider at [facility].  The patient voiced understanding of this recommendation.  She agrees with the discharge plan and did not have any concerns  with discharge from the hospital today.   ____________________________ Jimmy Footman, MD ahg:ds D: 05/31/2014 16:52:28 ET T: 05/31/2014 18:01:33 ET JOB#: [id number]  cc: Jimmy Footman, MD, <Dictator> Horton Chin MD ELECTRONICALLY SIGNED 06/01/2014 15:32
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
## 2015-03-05 IMAGING — US US PELV - US TRANSVAGINAL
1 series · 14 of 25 positions shown · non-contrast
Comparison: none

REASON FOR EXAM: febrile, tachycardia, cmt
COMMENTS:

[Series 1: us pelv - us transvaginal · 0.28mm/px · 14 of 102 slices shown]
[im 1/102]
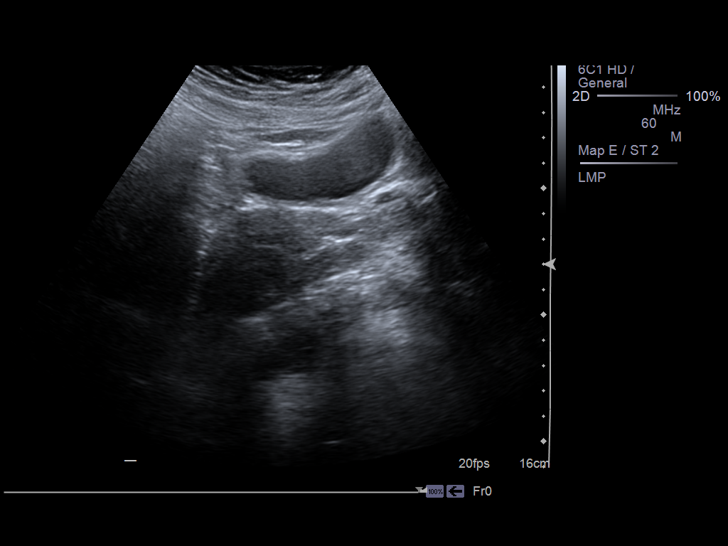
[im 9/102]
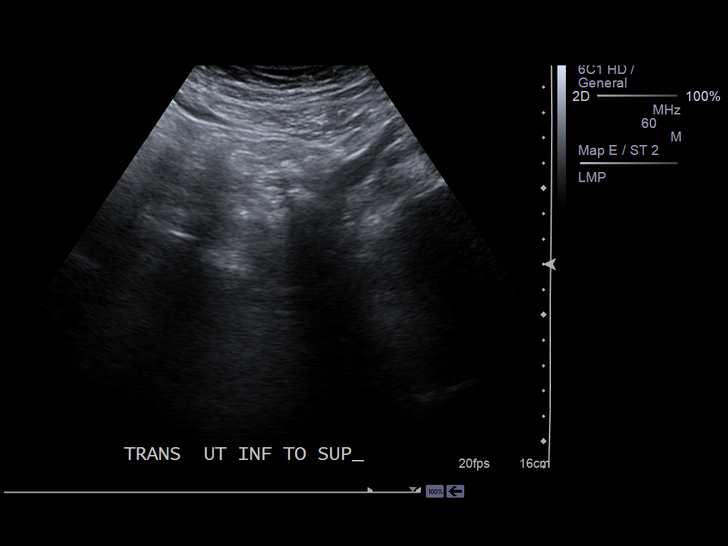
[im 17/102]
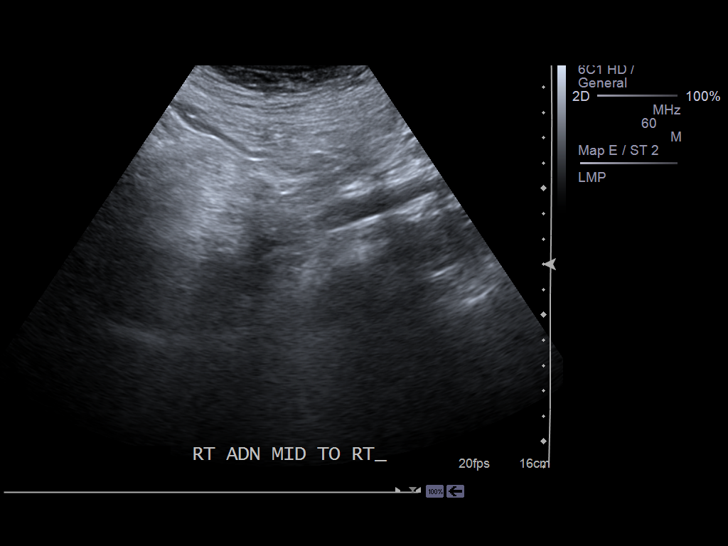
[im 26/102]
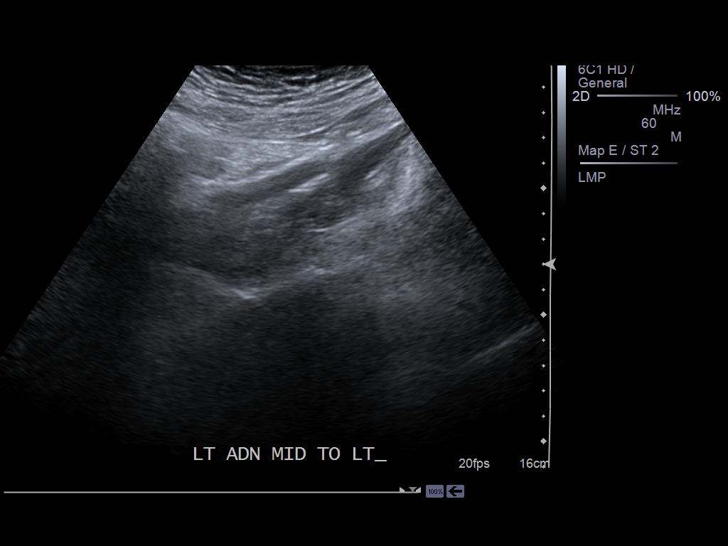
[im 34/102]
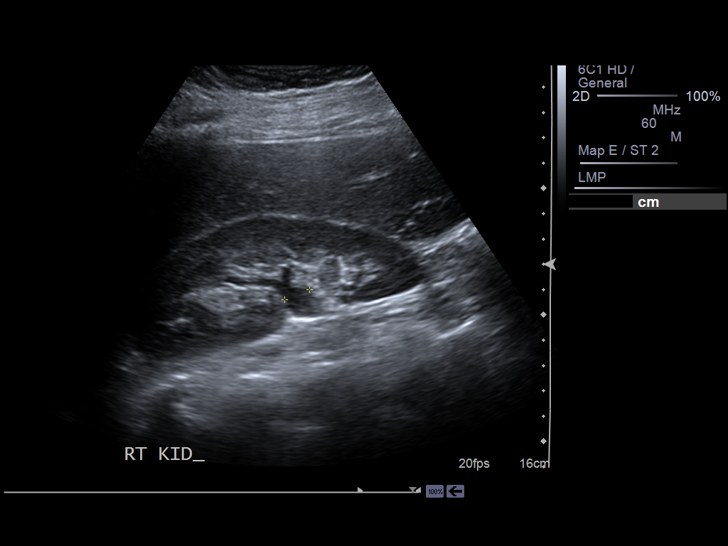
[im 38/102]
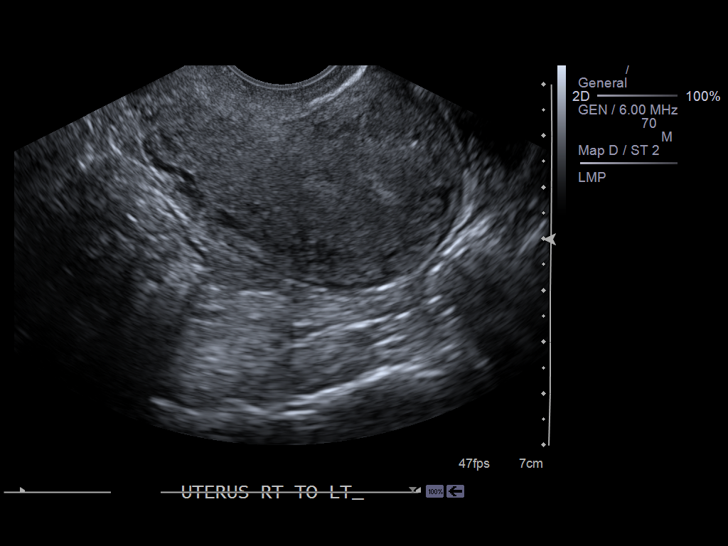
[im 47/102]
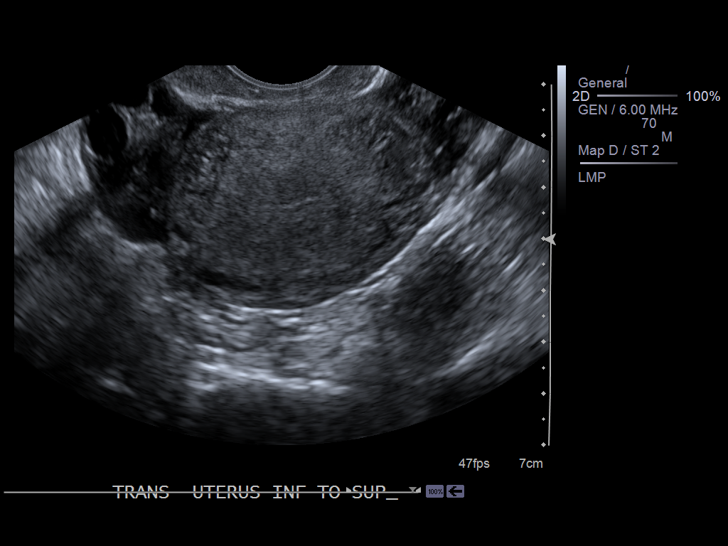
[im 55/102]
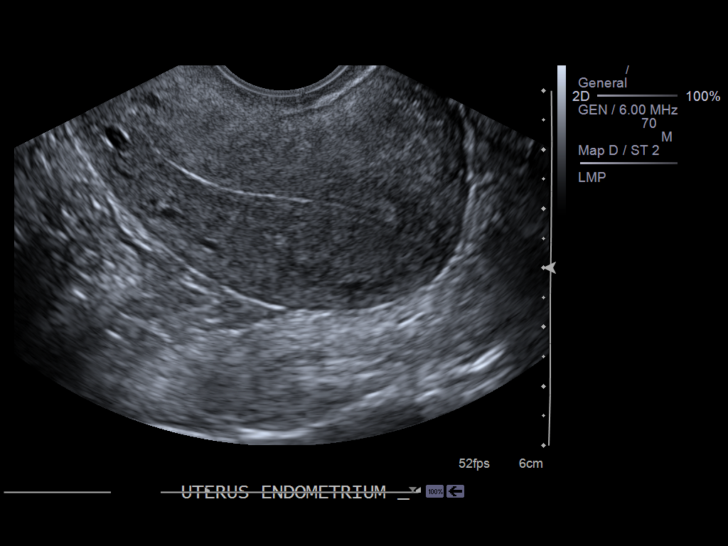
[im 64/102]
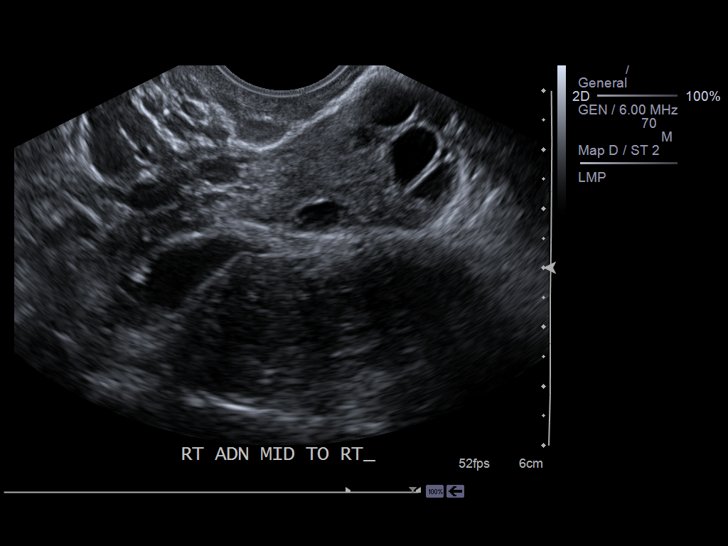
[im 68/102]
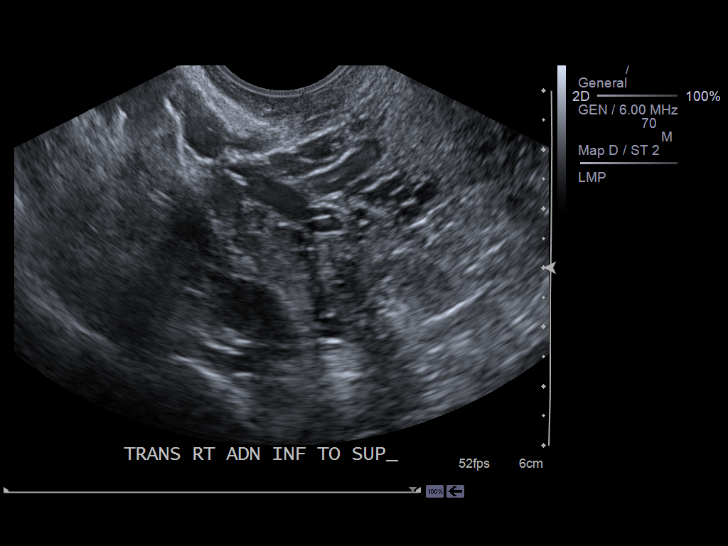
[im 76/102]
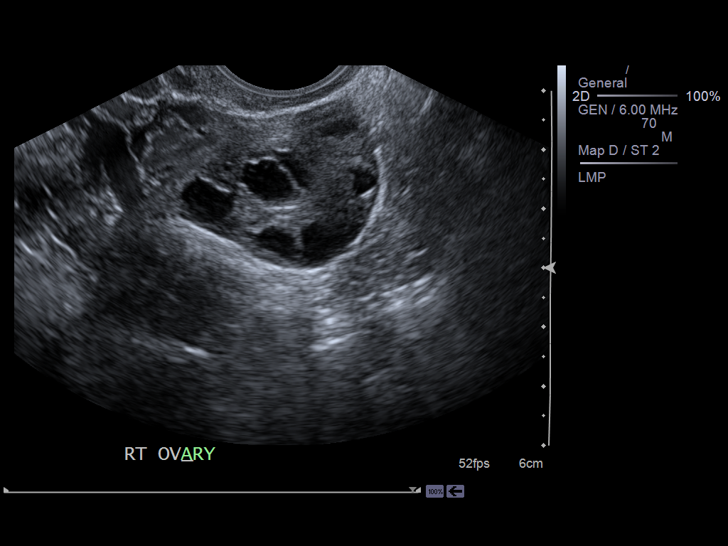
[im 85/102]
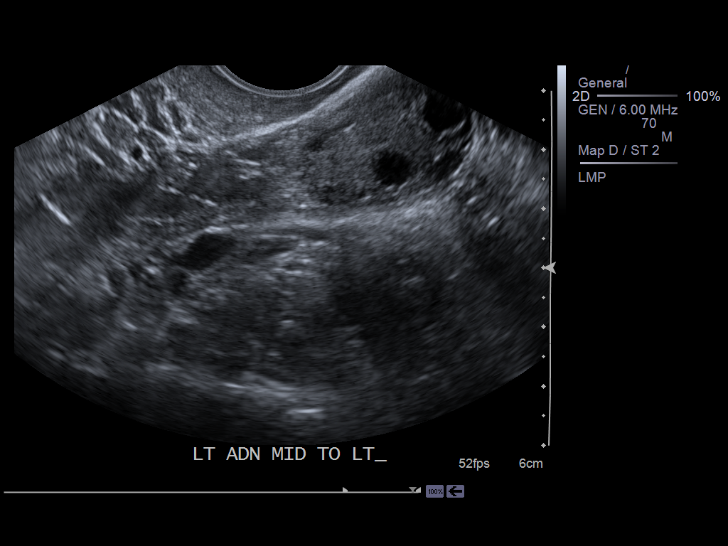
[im 93/102]
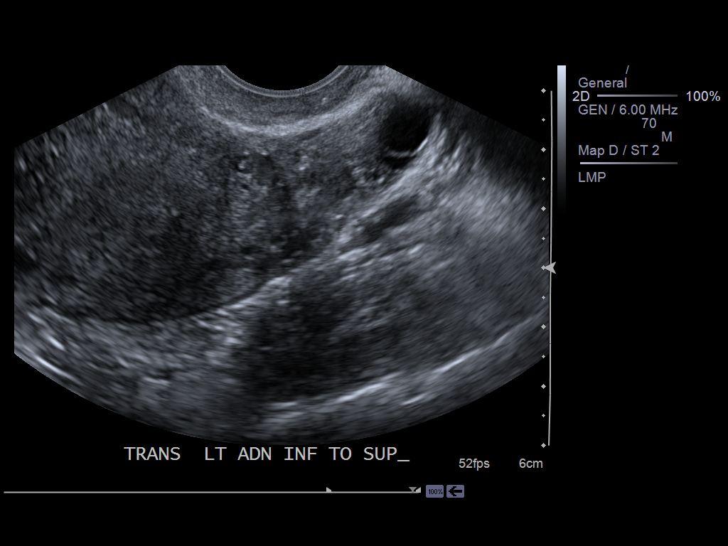
[im 102/102]
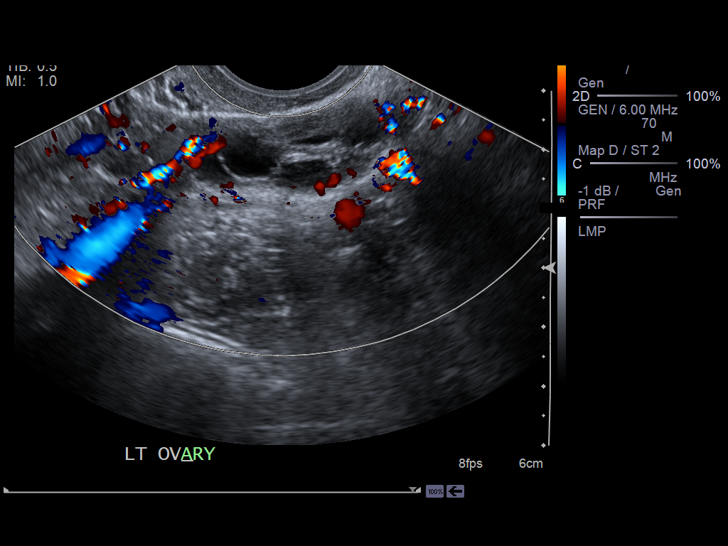

[14 of 25 positions shown; findings below may reference images not displayed]

PROCEDURE:     US  - US PELVIS EXAM W/TRANSVAGINAL  - April 30, 2013  [DATE]

RESULT:     Ultrasound of the pelvis shows the uterus measures 6.5 x 5.41 x
4.47 cm. There is a 2.0 mm endometrial thickness. The ovaries appear to be
normal. There is no free fluid seen in the cul-de-sac. The right ovary
measures 3.51 x 2.35 x 2.21 cm. The left ovary measures 4.08 x 2.04 x
cm. The kidneys show minimal right hydronephrosis. The left kidney is
unremarkable. Blood flow is seen in both ovaries. Follicles are present.
IMPRESSION: 1. Minimal right hydronephrosis of uncertain significance. Correlate
clinically.
2. Normal appearing pelvic sonogram.

[REDACTED]

## 2015-03-05 NOTE — Op Note (Signed)
PATIENT NAME:  Amber Nolan, Amber Nolan MR#:  [id number] DATE OF BIRTH:  07/18/1981  DATE OF PROCEDURE:  12/13/2011  PREOPERATIVE DIAGNOSIS: Postpartum, desires sterilization.   POSTOPERATIVE DIAGNOSIS: Postpartum, desires sterilization.   OPERATION: Bilateral partial salpingectomy.   SURGEON: Deloris Pinghilip J. Luella Cookosenow, MD    OPERATIVE FINDINGS: Normal appearing tubes.   OPERATION: After adequate general anesthesia, the patient was prepped and draped in routine fashion. An infraumbilical incision was made in the skin and carried down the various layers and the peritoneal cavity was entered. The right tube was grasped and after adequate identification of the right fimbria, the right tube was doubly ligated with a plain suture. A portion was removed. A like procedure was carried out on the other side. Both areas of surgery were inspected and found to be hemostatic. Peritoneum was closed with a purse-string suture. Fascia was reapproximated with two interrupted sutures. Skin was closed with subcuticular stitch. The patient tolerated the procedure well and left the operating room in good condition. Sponge and needle counts were said be correct at the end of the procedure. Estimated blood loss was minimal.   ____________________________ Deloris PingPhilip J. Luella Cookosenow, MD pjr:drc D: 12/13/2011 12:15:02 ET T: 12/13/2011 13:40:23 ET JOB#: [id number]  cc: Deloris PingPhilip J. Luella Cookosenow, MD, <Dictator> Towana BadgerPHILIP J Helen Winterhalter MD ELECTRONICALLY SIGNED 12/14/2011 0:21

## 2015-03-12 NOTE — Op Note (Signed)
PATIENT NAME:  Amber Nolan, Aza E MR#:  [id number] DATE OF BIRTH:  [birth year]  DATE OF PROCEDURE:  02/23/2015  ATTENDING:  [place] Bingharlie Federico Maiorino, MD  PREOPERATIVE DIAGNOSES:  1. Chronic pelvic pain.  2. Recurrent urinary tract infections.   POSTOPERATIVE DIAGNOSES: 1. Chronic pelvic pain. 2. Recurrent urinary tract infections.   PROCEDURE: Diagnostic laparoscopy and cystoscopy.   SURGEON: [place] Bingharlie Shelita Steptoe, MD  ASSISTANT: None.   ESTIMATED BLOOD LOSS: 10 milliliters.   URINE OUTPUT:  100 milliliters via indwelling Foley catheter.   ANESTHESIA: General and local.   ANTIBIOTICS: None indicated.   VTE PROPHYLAXIS: SCDs applied to bilateral lower extremities.   COMPLICATIONS: None.   SPECIMENS: None.   FINDINGS: On diagnostic laparoscopy, normal stomach edge, normal liver edge, no intra-abdominal adhesions were noted except in the area cecum, on the right lateral abdomino-pelvic side wall, from her prior appendectomy. Surgically removed bilateral tubes with small stumps remaining which were normal appearing and normal appearing ovaries, bilateral paraovarian fossas and anterior and posterior cul-de-sacs and normal appearing uterus. Approximately 3-4 subcentimeter, benign appearing paratubal cysts were seen on the left adnexa. On pelvic exam, normal appearing  multiparous cervix and the uterus sounded to 8 cm.   Normal appearing bladder and urethera with + efflux from the bilateral UOs.   DESCRIPTION OF PROCEDURE: The patient was taken to the operating room where the anesthesia was administered. She was then prepped and draped in normal sterile fashion in dorsal lithotomy position in [place] stirrups. Next, a Foley catheter was placed and speculum was then inserted into the patient's vagina.  A single tooth tenaculum was placed on the anterior lip of the cervix and the uterus was sounded to 8 cm. Next, a Hulka clamp was  placed and the tenaculum and the speculum was then removed. Gloves were then  changed and attention was then turned to the patient's abdomen with and the umbilicus infiltrated with local anesthesia and a 5 mm skin incision was then made and a 5 mm port was then inserted with the Veress needle. There was no fluid on aspiration and then positive normal saline drop test and the insufflation was then attached and the opening pressure was  noted to be 8 mm and the abdomen was then fully insufflated and the diagnostic laparoscope was then introduced with the above noted findings.  A second 5 mm port was then placed in the suprapubic area under direct visualization and the patient placed in steep  Trendelenburg to confirm the above noted findings. All other ports were then removed with the suprapubic one under direct visualization.  The abdomen was then desufflated prior to this and the skin incisions closed with Dermabond. Attention was then turned below and the Hulka clamp was then removed and the cervix inspected the speculum and no bleeding was noted.   The Foley catheter was then removed and a cystoscope was then introduced with normal appearing bladder, positive efflux from  both UOs and no evidence of any scar tissue, endometriosis, adhesions or any sort of abnormalities noted in the bladder or in the urethra. The Foley catheter was then replaced to drain the bladder and then removed. Sponge, lap, needle, and instrument counts were correct x2, and the patient was taken to the recovery room, awake, alert, breathing independently in stable condition.     ____________________________ [place] Bingharlie Keilen Kahl, MD cp:tr D: 02/23/2015 11:29:39 ET T: 02/23/2015 13:12:14 ET JOB#: [id number]  cc: [place] Bingharlie Gurpreet Mariani, MD, <Dictator> [place] BingHARLIE Leelah Hanna MD ELECTRONICALLY SIGNED 02/28/2015 10:32

## 2015-03-21 NOTE — H&P (Signed)
L&D Evaluation:  History:   HPI [age] yo G3P2002 WF with EDC=12/11/2011 by LMP=03/06/2011 who presents for elective IOL.   [facility]  Her pregnancy has been complicated by lower back pain due to a HNP, HSV, migraines, and depression/anxiety.She has been taking Zoloft during her pregnancy  and is currently on Valtrex 500mg m BID. She has not had any recent HSV lesions. Additionally, she has been treated for a pruritic rash in her second trimester-which required a steroid pack, steroid cream, and Zyrtec/Benadryl. Her anxiety and depression exacerbated by the FOB's incarceration. 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 She desires a ppBTL and has signed her 30 day papers 10/01/2011.  She notes +FM, no LOF, no VB. Has had a little bloody show and has had bouts of regular ctxs. Past OB HX remarkable for SVD x2 in 2002 and 2010. Baby in 2002 was 9#12 oz and delivery was c/b shoulder dystocia. Baby in 2010 was 8#1oz and labor was induced due to shoulder dystocia hx. Labs: O POS, RI, VI, GBS negative.    Presents with back pain, IOL    Patient's Medical History other  Genital HSV, Depression/anxiety, recurrent UTIs, migraines    Patient's Surgical History Appendectomy    Medications Pre Natal Vitamins  Zoloft 50mg  po daily, Valtrex 500mg m BID    Allergies Sulfa    Social History none    Family History Non-Contributory   ROS:   ROS see HPI   Exam:   Vital Signs stable  126/75     Urine Protein not completed    General no apparent distress    Mental Status clear     Chest clear     Heart normal sinus rhythm, no murmur/gallop/rubs    Abdomen gravid, non-tender    Estimated Fetal Weight 8 1/2#    Fetal Position vtx    Edema 1+     Pelvic no external lesions, 3/60%/-1 to 0/posterior    Mebranes Intact    FHT normal rate with no decels, 135 with accels to 160s to 170s    FHT Description mod variability    Fetal Heart Rate 135     Ucx occasional     Skin dry   Impression:   Impression IUP at 39 6/7 weeks for IOL. Hx of macrosomia   Plan:   Plan EFM/NST, monitor contractions and for cervical change, Pitocin IOL-Discussed risks of hyperstimulation, FITL, C-section, lack of success in inducing labor and patient adamant about proceeding with IOL.    Comments Discussed pain relief measures. Epidural did not work last time and she did not like how the IV pain meds made her feel. She will think about what she desires.   Electronic Signatures: Trinna BalloonGutierrez, Iridiana Fonner L (CNM)  (Signed 30-Jan-13 22:21)  Authored: L&D Evaluation   Last Updated: 30-Jan-13 22:21 by Trinna BalloonGutierrez, Mita Vallo L (CNM)

## 2015-04-20 ENCOUNTER — Encounter: Payer: Self-pay | Admitting: Emergency Medicine

## 2015-04-20 ENCOUNTER — Emergency Department
Admission: EM | Admit: 2015-04-20 | Discharge: 2015-04-20 | Disposition: A | Discharge status: Home / Self Care | Payer: Medicaid Other | Attending: Emergency Medicine | Admitting: Emergency Medicine

## 2015-04-20 DIAGNOSIS — Z79899 Other long term (current) drug therapy: Secondary | ICD-10-CM | POA: Diagnosis not present

## 2015-04-20 DIAGNOSIS — G43909 Migraine, unspecified, not intractable, without status migrainosus: Secondary | ICD-10-CM | POA: Diagnosis present

## 2015-04-20 DIAGNOSIS — G43911 Migraine, unspecified, intractable, with status migrainosus: Secondary | ICD-10-CM | POA: Insufficient documentation

## 2015-04-20 LAB — URINALYSIS COMPLETE WITH MICROSCOPIC (ARMC ONLY)
BILIRUBIN URINE: NEGATIVE
Glucose, UA: NEGATIVE mg/dL
HGB URINE DIPSTICK: NEGATIVE
LEUKOCYTES UA: NEGATIVE
NITRITE: NEGATIVE
PROTEIN: NEGATIVE mg/dL
SPECIFIC GRAVITY, URINE: 1.012 (ref 1.005–1.030)
pH: 8 (ref 5.0–8.0)

## 2015-04-20 MED ORDER — PROMETHAZINE HCL 25 MG RE SUPP: 25.0000 mg | Freq: Four times a day (QID) | RECTAL | Status: DC | PRN

## 2015-04-20 MED ORDER — KETOROLAC TROMETHAMINE 30 MG/ML IJ SOLN
INTRAMUSCULAR | Status: AC
  Filled 2015-04-20: qty 1

## 2015-04-20 MED ORDER — METRONIDAZOLE 500 MG PO TABS: 500.0000 mg | ORAL_TABLET | Freq: Two times a day (BID) | ORAL | Status: DC

## 2015-04-20 MED ORDER — DIPHENHYDRAMINE HCL 50 MG/ML IJ SOLN
INTRAMUSCULAR | Status: AC
  Filled 2015-04-20: qty 1

## 2015-04-20 MED ORDER — METOCLOPRAMIDE HCL 5 MG/ML IJ SOLN
INTRAMUSCULAR | Status: AC
  Filled 2015-04-20: qty 2

## 2015-04-20 MED ORDER — MAGNESIUM SULFATE 2 GM/50ML IV SOLN
INTRAVENOUS | Status: AC
  Filled 2015-04-20: qty 50

## 2015-04-20 MED ORDER — DEXAMETHASONE SODIUM PHOSPHATE 10 MG/ML IJ SOLN: 10.0000 mg | Freq: Once | INTRAMUSCULAR | Status: DC

## 2015-04-20 MED ORDER — SODIUM CHLORIDE 0.9 % IV BOLUS (SEPSIS)
1000.0000 mL | Freq: Once | INTRAVENOUS | Status: AC
  Administered 2015-04-20: 1000 mL via INTRAVENOUS

## 2015-04-20 MED ORDER — DIPHENHYDRAMINE HCL 50 MG/ML IJ SOLN
12.5000 mg | Freq: Once | INTRAMUSCULAR | Status: AC
  Administered 2015-04-20: 12.5 mg via INTRAVENOUS

## 2015-04-20 MED ORDER — LORAZEPAM 2 MG/ML IJ SOLN
0.5000 mg | Freq: Once | INTRAMUSCULAR | Status: AC
  Administered 2015-04-20: 0.5 mg via INTRAVENOUS

## 2015-04-20 MED ORDER — METOCLOPRAMIDE HCL 5 MG/ML IJ SOLN
10.0000 mg | Freq: Once | INTRAMUSCULAR | Status: AC
  Administered 2015-04-20: 10 mg via INTRAVENOUS

## 2015-04-20 MED ORDER — MAGNESIUM SULFATE 2 GM/50ML IV SOLN
2.0000 g | Freq: Once | INTRAVENOUS | Status: AC
  Administered 2015-04-20: 2 g via INTRAVENOUS

## 2015-04-20 MED ORDER — KETOROLAC TROMETHAMINE 30 MG/ML IJ SOLN
30.0000 mg | Freq: Once | INTRAMUSCULAR | Status: AC
  Administered 2015-04-20: 30 mg via INTRAVENOUS

## 2015-04-20 MED ORDER — LORAZEPAM 2 MG/ML IJ SOLN
INTRAMUSCULAR | Status: AC
  Filled 2015-04-20: qty 1

## 2015-04-20 NOTE — Discharge Instructions (Signed)
Use Phenergan suppositories if needed to control nausea when you have a headache with severe nausea. Continue to take your usual medications as previously prescribed. Return to the emergency department if you have worsening symptoms or other urgent concerns.  Migraine Headache A migraine headache is very bad, throbbing pain on one or both sides of your head. Talk to your doctor about what things may bring on (trigger) your migraine headaches. HOME CARE  Only take medicines as told by your doctor.  Lie down in a dark, quiet room when you have a migraine.  Keep a journal to find out if certain things bring on migraine headaches. For example, write down:  What you eat and drink.  How much sleep you get.  Any change to your diet or medicines.  Lessen how much alcohol you drink.  Quit smoking if you smoke.  Get enough sleep.  Lessen any stress in your life.  Keep lights dim if bright lights bother you or make your migraines worse. GET HELP RIGHT AWAY IF:   Your migraine becomes really bad.  You have a fever.  You have a stiff neck.  You have trouble seeing.  Your muscles are weak, or you lose muscle control.  You lose your balance or have trouble walking.  You feel like you will pass out (faint), or you pass out.  You have really bad symptoms that are different than your first symptoms. MAKE SURE YOU:   Understand these instructions.  Will watch your condition.  Will get help right away if you are not doing well or get worse. Document Released: 08/06/2008 Document Revised: 01/20/2012 Document Reviewed: 07/05/2013 [facility] Patient Information 2015 [place], [place]. This information is not intended to replace advice given to you by your health care provider. Make sure you discuss any questions you have with your health care provider.

## 2015-04-20 NOTE — ED Provider Notes (Signed)
[facility] Emergency Department Provider Note  ____________________________________________  Time seen: 12:20 PM   I have reviewed the triage vital signs and the nursing notes.   HISTORY  Chief Complaint Migraine   HPI Amber Nolan is a 34 y.o. female with a history of migraines. She reports she's been having increased problems with migraines having a headache every 7-10 days. She will this morning proximally 5 AM with a headache. This headache is similar to prior ones but with a little bit greater intensity. She does have some nausea and vomiting with this one which she does not know if you. Due to the nausea and vomiting she is not able to keep down her regular migraine medicines. This includes Imitrex which she took. She does not have any antinausea medicine at home at this point.  The patient was recently treated for a tick bite with doxycycline. She was also treated with Diflucan due to concerns for developing a yeast infection from the antibiotic. Now she reports that she thinks she has bacterial vaginosis. She denies urinary frequency or other signs of a urinary tract infection.      Past Medical History  Diagnosis Date  . Migraines   . Gastritis     Patient Active Problem List   Diagnosis Date Noted  . Migraine with aura 01/10/2014    Past Surgical History  Procedure Laterality Date  . Appendectomy  1993  . Cholecystectomy  2014  . Tubal ligation  2013    Current Outpatient Rx  Name  Route  Sig  Dispense  Refill  . ALPRAZolam (XANAX) 0.25 MG tablet   Oral   Take 0.25 mg by mouth 2 (two) times daily as needed for anxiety.         . butalbital-acetaminophen-caffeine (FIORICET) 50-325-40 MG per tablet   Oral   Take by mouth 2 (two) times daily as needed for headache (Take 1-2 tablets by mouth every 4-6 hours).         . metroNIDAZOLE (FLAGYL) 500 MG tablet   Oral   Take 1 tablet (500 mg total) by mouth 2 (two) times  daily.   14 tablet   0   . naproxen (NAPROSYN) 250 MG tablet   Oral   Take 1 tablet (250 mg total) by mouth 2 (two) times daily with a meal.   60 tablet   3   . promethazine (PHENERGAN) 25 MG suppository   Rectal   Place 1 suppository (25 mg total) rectally every 6 (six) hours as needed for nausea.   12 suppository   1   . sertraline (ZOLOFT) 100 MG tablet   Oral   Take 100 mg by mouth daily.         . SUMAtriptan (IMITREX) 100 MG tablet   Oral   Take 100 mg by mouth every 2 (two) hours as needed for migraine or headache. May repeat in 2 hours if headache persists or recurs.         . topiramate (TOPAMAX) 50 MG tablet   Oral   Take 50 mg by mouth 2 (two) times daily.           Allergies Sulfa antibiotics  Family History  Problem Relation Age of Onset  . Cancer - Other      LIVER  . Diabetes    . Hypertension    . Hypothyroidism    . Stroke    . Heart disease      Social History  History  Substance Use Topics  . Smoking status: Never Smoker   . Smokeless tobacco: Never Used  . Alcohol Use: Yes     Comment: Social    Review of Systems  Constitutional: Negative for fever. ENT: Negative for sore throat. Cardiovascular: Negative for chest pain. Respiratory: Negative for shortness of breath. Gastrointestinal: Negative for abdominal pain & diarrhea. Positive nausea and vomiting. Genitourinary: Negative for dysuria, but patient reports symptoms of bacterial vaginosis. Musculoskeletal: Negative for back pain. Skin: Negative for rash. Neurological: Positive for headaches. See history of present illness   10-point ROS otherwise negative.  ____________________________________________   PHYSICAL EXAM:  VITAL SIGNS: ED Triage Vitals  Enc Vitals Group     BP 04/20/15 1205 133/82 mmHg     Pulse Rate 04/20/15 1205 61     Resp 04/20/15 1205 18     Temp 04/20/15 1205 98 F (36.7 C)     Temp Source 04/20/15 1205 Oral     SpO2 04/20/15 1205 98 %      Weight 04/20/15 1205 200 lb (90.719 kg)     Height 04/20/15 1205  (1.626 m)     Head Cir --      Peak Flow --      Pain Score 04/20/15 1207 10     Pain Loc --      Pain Edu? --      Excl. in GC? --     Constitutional: Alert and oriented. Patient is communicative but she looks a little bit uncomfortable consistent with migraine. ENT   Head: Normocephalic and atraumatic.   Nose: No congestion/rhinnorhea.   Mouth/Throat: Mucous membranes are moist. Cardiovascular: Normal rate, regular rhythm. Respiratory: Normal respiratory effort without tachypnea. Breath sounds are clear and equal bilaterally. No wheezes/rales/rhonchi. Gastrointestinal: Soft and nontender. No distention.  Back: No muscle spasm, no tenderness, no CVA tenderness. Musculoskeletal: Nontender with normal range of motion in all extremities.  No noted edema. Neurologic:  Normal speech and language. No gross focal neurologic deficits are appreciated.  Negative Romberg, negative pronator drift, negative finger to nose, cranial nerves II through XII are intact, grips are equal bilaterally, strength 5 or 5 in all 4 extremities.. Skin:  Skin is warm, dry. No rash noted. Psychiatric: Mood and affect are normal. Speech and behavior are normal.  ____________________________________________     INITIAL IMPRESSION / ASSESSMENT AND PLAN / ED COURSE  Patient with relatively frequent migraines with more severe symptoms today. We will treat her with Reglan, Toradol, magnesium.   ----------------------------------------- 1:31 PM on 04/20/2015 -----------------------------------------  Patient's headache is improving but she feels a little bit anxious. This may be a reaction to the Reglan. I will give her 0.5 mg of Ativan to help ease this reaction.  ----------------------------------------- 1:46 PM on 04/20/2015 -----------------------------------------  Patient feels better already.  The uncomfortable feeling has  passed. She is very appreciative for the care.  ----------------------------------------- 3:30 PM on 04/20/2015 -----------------------------------------  Patient feels much better. We are just getting a urine sample now. We will send to the lab but discharge the patient at this time. She had asked me earlier for a prescription for Flagyl due to her diagnosed bacterial vaginosis. We have provided a prescription for that as well as Phenergan suppositories. She continues other antinausea medicine as needed but the suppositories are intended for times that this morning where she is not able tolerate anything by mouth.    ____________________________________________   FINAL CLINICAL IMPRESSION(S) / ED DIAGNOSES  Final diagnoses:  Intractable migraine with status migrainosus, unspecified migraine type      Darien Ramus, MD 04/20/15 [phone]

## 2015-04-20 NOTE — ED Notes (Signed)
Pt resting better after iv ativan. Vss. Magnesium infusing at this time. Cont to monitor.

## 2015-04-20 NOTE — ED Notes (Signed)
Developed migraine with pos n/v .Marland Kitchenstates she woke up with headache at 5 am unable to keep po meds down at home

## 2015-04-20 NOTE — ED Notes (Signed)
Pt states she feels like she is crawling out of her skin from the meds given earlier. Headache is better. edp notified and orders to give Ativan .0.5mg  iv. See mar.  Cont to monitor. vss.

## 2015-10-09 ENCOUNTER — Ambulatory Visit
Admission: RE | Admit: 2015-10-09 | Discharge: 2015-10-09 | Disposition: A | Discharge status: Home / Self Care | Payer: Medicaid Other | Source: Ambulatory Visit | Attending: Urology | Admitting: Urology

## 2015-10-09 ENCOUNTER — Ambulatory Visit (INDEPENDENT_AMBULATORY_CARE_PROVIDER_SITE_OTHER): Payer: Medicaid Other | Admitting: Urology

## 2015-10-09 ENCOUNTER — Encounter: Payer: Self-pay | Admitting: Urology

## 2015-10-09 VITALS — BP 133/84 | HR 76 | Ht 64.0 in | Wt 197.9 lb

## 2015-10-09 DIAGNOSIS — N390 Urinary tract infection, site not specified: Secondary | ICD-10-CM | POA: Insufficient documentation

## 2015-10-09 DIAGNOSIS — N200 Calculus of kidney: Secondary | ICD-10-CM | POA: Insufficient documentation

## 2015-10-09 DIAGNOSIS — M419 Scoliosis, unspecified: Secondary | ICD-10-CM | POA: Insufficient documentation

## 2015-10-09 DIAGNOSIS — Z9049 Acquired absence of other specified parts of digestive tract: Secondary | ICD-10-CM | POA: Insufficient documentation

## 2015-10-09 DIAGNOSIS — R300 Dysuria: Secondary | ICD-10-CM | POA: Diagnosis not present

## 2015-10-09 DIAGNOSIS — R102 Pelvic and perineal pain: Secondary | ICD-10-CM

## 2015-10-09 LAB — URINALYSIS, COMPLETE
BILIRUBIN UA: NEGATIVE
GLUCOSE, UA: NEGATIVE
Ketones, UA: NEGATIVE
Leukocytes, UA: NEGATIVE
Nitrite, UA: NEGATIVE
Protein, UA: NEGATIVE
RBC, UA: NEGATIVE
SPEC GRAV UA: 1.025 (ref 1.005–1.030)
Urobilinogen, Ur: 0.2 mg/dL (ref 0.2–1.0)
pH, UA: 6 (ref 5.0–7.5)

## 2015-10-09 LAB — MICROSCOPIC EXAMINATION

## 2015-10-09 LAB — BLADDER SCAN AMB NON-IMAGING

## 2015-10-09 NOTE — Progress Notes (Signed)
10/09/2015 3:06 PM   Amber Nolan 06-Mar-1981 [id number]  Referring provider: [facility] [address] [place], [place] [address]  Chief Complaint  Patient presents with  . Recurrent UTI    New Patient    HPI: 34 yo F referred for recurrent UTIs.  She has been having UA/ urine dips at [facility] and later [facility] but no cultures obtained.  She has been treated with numerous urinary tract infections over the past 2 years and unable to quantify specifically.    She has been on daily Macrobid x 3 months for suppression without any UTIs during that time.  She stopped taking for 1-2 weeks but then developed recurrent urinary tract symptoms with a positive dip at her PCP and was told to resume Macrobid twice daily with resolution of her symptoms.   She does think that her infections tend to come around the time of her period.    She does also have a history of recurrent BV after each treatment.  Her symptoms include lower abdominal pelvic pain and pain with intercourse and increase vaginal discharge with a foul odor.  Her symptoms including bladder pressure, lower back pain, foul smelling urine, dysuria, but no gross hematuria.  She occasional will have low grade temps if the infection goes untreated for a period of time.     She has been evaluated at [facility] in 2014 for pelvic pain and exploratory laparoscopy which was negative for endometriosis.  She does have a history of heavy menstrual bleeding which has since resolved.    She does drink a good amount of cranberry juice to try and help with the infections.    She did have 1 positive urine culture over the past year in our system on 01/30/2015 which grew 80,000 colonies of pansensitive Escherichia coli. No additional urine culture data available for review.  She reports a personal history of kidney stones which she has been able to pass on her own. Her last one was a few years ago.  No  recent imaging.   PMH: Past Medical History  Diagnosis Date  . Migraines   . Gastritis   . GERD (gastroesophageal reflux disease)   . Anxiety and depression   . Stomach ulcer     Surgical History: Past Surgical History  Procedure Laterality Date  . Appendectomy  1993  . Cholecystectomy  2014  . Tubal ligation  2013  . Exploratory laparotomy  2015    Home Medications:    Medication List       This list is accurate as of: 10/09/15 11:59 PM.  Always use your most recent med list.               ALPRAZolam 0.25 MG tablet  Commonly known as:  XANAX  Take 0.25 mg by mouth 2 (two) times daily as needed for anxiety.     citalopram 10 MG tablet  Commonly known as:  CELEXA  Take 10 mg by mouth daily.     SUMAtriptan 100 MG tablet  Commonly known as:  IMITREX  Take 100 mg by mouth every 2 (two) hours as needed for migraine or headache. May repeat in 2 hours if headache persists or recurs.        Allergies:  Allergies  Allergen Reactions  . Sulfa Antibiotics     Family History: Family History  Problem Relation Age of Onset  . Liver cancer Father     LIVER  .  Diabetes    . Hypertension    . Hypothyroidism    . Stroke    . Heart disease    . Bladder Cancer Neg Hx   . Prostate cancer Neg Hx   . Kidney cancer Neg Hx     Social History:  reports that she has never smoked. She has never used smokeless tobacco. She reports that she drinks alcohol. She reports that she does not use illicit drugs.  ROS: UROLOGY Frequent Urination?: Yes Hard to postpone urination?: Yes Burning/pain with urination?: Yes Get up at night to urinate?: Yes Leakage of urine?: Yes Urine stream starts and stops?: Yes Trouble starting stream?: Yes Do you have to strain to urinate?: Yes Blood in urine?: No Urinary tract infection?: Yes Sexually transmitted disease?: Yes Injury to kidneys or bladder?: No Painful intercourse?: Yes Weak stream?: Yes Currently pregnant?: No Vaginal  bleeding?: No Last menstrual period?: 09/26/15  Gastrointestinal Nausea?: Yes Vomiting?: No Indigestion/heartburn?: No Diarrhea?: Yes Constipation?: Yes  Constitutional Fever: Yes Night sweats?: No Weight loss?: No Fatigue?: Yes  Skin Skin rash/lesions?: No Itching?: No  Eyes Blurred vision?: No Double vision?: No  Ears/Nose/Throat Sore throat?: No Sinus problems?: No  Hematologic/Lymphatic Swollen glands?: No Easy bruising?: Yes  Cardiovascular Leg swelling?: No Chest pain?: No  Respiratory Cough?: No Shortness of breath?: No  Endocrine Excessive thirst?: No  Musculoskeletal Back pain?: Yes Joint pain?: Yes  Neurological Headaches?: Yes Dizziness?: Yes  Psychologic Depression?: Yes Anxiety?: Yes  Physical Exam: BP 133/84 mmHg  Pulse 76  Ht 5\' 4"  (1.626 m)  Wt 197 lb 14.4 oz (89.767 kg)  BMI 33.95 kg/m2  LMP 09/26/2015  Constitutional:  Alert and oriented, No acute distress. HEENT: [place] AT, moist mucus membranes.  Trachea midline, no masses. Cardiovascular: No clubbing, cyanosis, or edema. Respiratory: Normal respiratory effort, no increased work of breathing. GI: Abdomen is soft, nontender, nondistended, no abdominal masses GU: No CVA tenderness.  Skin: No rashes, bruises or suspicious lesions. Lymph: No cervical adenopathy. Neurologic: Grossly intact, no focal deficits, moving all 4 extremities. Psychiatric: Normal mood and affect.  Laboratory Data: Lab Results  Component Value Date   WBC 10.9 05/28/2014   HGB 13.3 02/20/2015   HCT 39.5 05/28/2014   MCV 95 05/28/2014   PLT 289 05/28/2014    Lab Results  Component Value Date   CREATININE 0.82 05/28/2014    Urinalysis Results for orders placed or performed in visit on 10/09/15  Microscopic Examination  Result Value Ref Range   WBC, UA 0-5 0 -  5 /hpf   RBC, UA 0-2 0 -  2 /hpf   Epithelial Cells (non renal) 0-10 0 - 10 /hpf   Mucus, UA Present (A) Not Estab.   Bacteria, UA  Moderate (A) None seen/Few  Urinalysis, Complete  Result Value Ref Range   Specific Gravity, UA 1.025 1.005 - 1.030   pH, UA 6.0 5.0 - 7.5   Color, UA Yellow Yellow   Appearance Ur Clear Clear   Leukocytes, UA Negative Negative   Protein, UA Negative Negative/Trace   Glucose, UA Negative Negative   Ketones, UA Negative Negative   RBC, UA Negative Negative   Bilirubin, UA Negative Negative   Urobilinogen, Ur 0.2 0.2 - 1.0 mg/dL   Nitrite, UA Negative Negative   Microscopic Examination See below:   BLADDER SCAN AMB NON-IMAGING  Result Value Ref Range   Scan Result 53ml     Pertinent Imaging: PVR 53   Assessment & Plan:  34 yo F with ? Recurrent UTIs (no culture data to support this other than single culture 8 month ago growing pansensitive E. Coli over the past year), recurrent bacterial vaginosis.    1. Recurrent UTI -Cystex plus daily recommended for urinary pain and UTI ppx -Behavioral modification discussed at length today -Patient advised to return to our office if she experiences any symptoms consistent with UTI, explained the importance of obtaining urine culture with sensitivity data to prove that her symptoms are indeed related to infections and no some other etiology -Antibiotic stewardship discussed today, advised to stop abx if tolerated -KUB to rule out significant stone burden as nidus of infection given history of stones - UA today not concerning for infection - BLADDER SCAN AMB NON-IMAGING- no evidence of incomplete bladder emptying -plan for pelvic exam next visit/ discuss KUB  2. Dysuria No evidence of infection today, as above  3. Pelvic pain As above, pelvic exam at next visit  Return in about 2 months (around 12/09/2015) for recheck on recurrent UTIs.  Vanna Scotland, MD  [facility] [address], [address] [place], [place] [address] [phone]

## 2015-11-24 ENCOUNTER — Emergency Department
Admission: EM | Admit: 2015-11-24 | Discharge: 2015-11-24 | Disposition: A | Discharge status: Home / Self Care | Payer: Medicaid Other | Attending: Emergency Medicine | Admitting: Emergency Medicine

## 2015-11-24 ENCOUNTER — Encounter: Payer: Self-pay | Admitting: Emergency Medicine

## 2015-11-24 DIAGNOSIS — Z79899 Other long term (current) drug therapy: Secondary | ICD-10-CM | POA: Diagnosis not present

## 2015-11-24 DIAGNOSIS — M62830 Muscle spasm of back: Secondary | ICD-10-CM

## 2015-11-24 DIAGNOSIS — M419 Scoliosis, unspecified: Secondary | ICD-10-CM

## 2015-11-24 DIAGNOSIS — M545 Low back pain: Secondary | ICD-10-CM | POA: Diagnosis present

## 2015-11-24 HISTORY — DX: Scoliosis, unspecified: M41.9

## 2015-11-24 MED ORDER — METHOCARBAMOL 500 MG PO TABS: 500.0000 mg | ORAL_TABLET | Freq: Four times a day (QID) | ORAL | Status: DC

## 2015-11-24 MED ORDER — TRAMADOL HCL 50 MG PO TABS: 50.0000 mg | ORAL_TABLET | Freq: Four times a day (QID) | ORAL | Status: DC | PRN

## 2015-11-24 MED ORDER — MELOXICAM 15 MG PO TABS: 15.0000 mg | ORAL_TABLET | Freq: Every day | ORAL | Status: DC

## 2015-11-24 NOTE — Discharge Instructions (Signed)
Back Exercises °The following exercises strengthen the muscles that help to support the back. They also help to keep the lower back flexible. Doing these exercises can help to prevent back pain or lessen existing pain. °If you have back pain or discomfort, try doing these exercises 2-3 times each day or as told by your health care provider. When the pain goes away, do them once each day, but increase the number of times that you repeat the steps for each exercise (do more repetitions). If you do not have back pain or discomfort, do these exercises once each day or as told by your health care provider. °EXERCISES °Single Knee to Chest °Repeat these steps 3-5 times for each leg: °· Lie on your back on a firm bed or the floor with your legs extended. °· Bring one knee to your chest. Your other leg should stay extended and in contact with the floor. °· Hold your knee in place by grabbing your knee or thigh. °· Pull on your knee until you feel a gentle stretch in your lower back. °· Hold the stretch for 10-30 seconds. °· Slowly release and straighten your leg. °Pelvic Tilt °Repeat these steps 5-10 times: °· Lie on your back on a firm bed or the floor with your legs extended. °· Bend your knees so they are pointing toward the ceiling and your feet are flat on the floor. °· Tighten your lower abdominal muscles to press your lower back against the floor. This motion will tilt your pelvis so your tailbone points up toward the ceiling instead of pointing to your feet or the floor. °· With gentle tension and even breathing, hold this position for 5-10 seconds. °Cat-Cow °Repeat these steps until your lower back becomes more flexible: °· Get into a hands-and-knees position on a firm surface. Keep your hands under your shoulders, and keep your knees under your hips. You may place padding under your knees for comfort. °· Let your head hang down, and point your tailbone toward the floor so your lower back becomes rounded like the  back of a cat. °· Hold this position for 5 seconds. °· Slowly lift your head and point your tailbone up toward the ceiling so your back forms a sagging arch like the back of a cow. °· Hold this position for 5 seconds. °Press-Ups °Repeat these steps 5-10 times: °· Lie on your abdomen (face-down) on the floor. °· Place your palms near your head, about shoulder-width apart. °· While you keep your back as relaxed as possible and keep your hips on the floor, slowly straighten your arms to raise the top half of your body and lift your shoulders. Do not use your back muscles to raise your upper torso. You may adjust the placement of your hands to make yourself more comfortable. °· Hold this position for 5 seconds while you keep your back relaxed. °· Slowly return to lying flat on the floor. °Bridges °Repeat these steps 10 times: °· Lie on your back on a firm surface. °· Bend your knees so they are pointing toward the ceiling and your feet are flat on the floor. °· Tighten your buttocks muscles and lift your buttocks off of the floor until your waist is at almost the same height as your knees. You should feel the muscles working in your buttocks and the back of your thighs. If you do not feel these muscles, slide your feet 1-2 inches farther away from your buttocks. °· Hold this position for 3-5   seconds.  Slowly lower your hips to the starting position, and allow your buttocks muscles to relax completely. If this exercise is too easy, try doing it with your arms crossed over your chest. Abdominal Crunches Repeat these steps 5-10 times: 1. Lie on your back on a firm bed or the floor with your legs extended. 2. Bend your knees so they are pointing toward the ceiling and your feet are flat on the floor. 3. Cross your arms over your chest. 4. Tip your chin slightly toward your chest without bending your neck. 5. Tighten your abdominal muscles and slowly raise your trunk (torso) high enough to lift your shoulder blades  a tiny bit off of the floor. Avoid raising your torso higher than that, because it can put too much stress on your low back and it does not help to strengthen your abdominal muscles. 6. Slowly return to your starting position. Back Lifts Repeat these steps 5-10 times: 1. Lie on your abdomen (face-down) with your arms at your sides, and rest your forehead on the floor. 2. Tighten the muscles in your legs and your buttocks. 3. Slowly lift your chest off of the floor while you keep your hips pressed to the floor. Keep the back of your head in line with the curve in your back. Your eyes should be looking at the floor. 4. Hold this position for 3-5 seconds. 5. Slowly return to your starting position. SEEK MEDICAL CARE IF:  Your back pain or discomfort gets much worse when you do an exercise.  Your back pain or discomfort does not lessen within 2 hours after you exercise. If you have any of these problems, stop doing these exercises right away. Do not do them again unless your health care provider says that you can. SEEK IMMEDIATE MEDICAL CARE IF:  You develop sudden, severe back pain. If this happens, stop doing the exercises right away. Do not do them again unless your health care provider says that you can.   This information is not intended to replace advice given to you by your health care provider. Make sure you discuss any questions you have with your health care provider.   Document Released: 12/05/2004 Document Revised: 07/19/2015 Document Reviewed: 12/22/2014 Elsevier Interactive Patient Education 2016 [employer]  Scoliosis Scoliosis is the name given to a spine that curves sideways.Scoliosis can cause twisting of your shoulders, hips, chest, back, and rib cage.  CAUSES  The cause of scoliosis is not always known. It may be caused by a birth defect or by a disease that can cause muscular dysfunction and imbalance, such as cerebral palsy and muscular dystrophy.  RISK  FACTORS Having a disease that causes muscle disease or dysfunction. SIGNS AND SYMPTOMS Scoliosis often has no signs or symptoms.If they are present, they may include:  Unequal size of one body side compared to the other (asymmetry).  Visible curvature of the spine.  Pain. The pain may limit physical activity.  Shortness of breath.  Bowel or bladder issues. DIAGNOSIS A skilled health care provider will perform an evaluation. This will involve:  Taking your history.  Performing a physical examination.  Performing a neurological exam to detect nerve or muscle function loss.  Range of motion studies on the spine.  X-rays. An MRI may also be obtained. TREATMENT  Treatment varies depending on the nature, extent, and severity of the disease. If the curvature is not great, you may need only observation. A brace may be used to prevent scoliosis from  progressing. A brace may also be needed during growth spurts. Physical therapy may be of benefit. Surgery may be required.  HOME CARE INSTRUCTIONS   Your health care provider may suggest exercises to strengthen your muscles. Perform them as directed.  Ask your health care provider before participating in any sports.   If you have been prescribed an orthopedic brace, wear it as instructed by your health care provider. SEEK MEDICAL CARE IF: Your brace causes the skin to become sore (chafe) or is uncomfortable.  SEEK IMMEDIATE MEDICAL CARE IF:  You have back pain that is not relieved by the medicines prescribed by your health care provider.   Your legs feel weak or you lose function in your legs.  You lose some bowel or bladder control.    This information is not intended to replace advice given to you by your health care provider. Make sure you discuss any questions you have with your health care provider.   Document Released: 10/25/2000 Document Revised: 11/02/2013 Document Reviewed: 07/05/2013 Elsevier Interactive Patient  Education 2016 [employer]  Muscle Cramps and Spasms Muscle cramps and spasms occur when a muscle or muscles tighten and you have no control over this tightening (involuntary muscle contraction). They are a common problem and can develop in any muscle. The most common place is in the calf muscles of the leg. Both muscle cramps and muscle spasms are involuntary muscle contractions, but they also have differences:   Muscle cramps are sporadic and painful. They may last a few seconds to a quarter of an hour. Muscle cramps are often more forceful and last longer than muscle spasms.  Muscle spasms may or may not be painful. They may also last just a few seconds or much longer. CAUSES  It is uncommon for cramps or spasms to be due to a serious underlying problem. In many cases, the cause of cramps or spasms is unknown. Some common causes are:   Overexertion.   Overuse from repetitive motions (doing the same thing over and over).   Remaining in a certain position for a long period of time.   Improper preparation, form, or technique while performing a sport or activity.   Dehydration.   Injury.   Side effects of some medicines.   Abnormally low levels of the salts and ions in your blood (electrolytes), especially potassium and calcium. This could happen if you are taking water pills (diuretics) or you are pregnant.  Some underlying medical problems can make it more likely to develop cramps or spasms. These include, but are not limited to:   Diabetes.   Parkinson disease.   Hormone disorders, such as thyroid problems.   Alcohol abuse.   Diseases specific to muscles, joints, and bones.   Blood vessel disease where not enough blood is getting to the muscles.  HOME CARE INSTRUCTIONS   Stay well hydrated. Drink enough water and fluids to keep your urine clear or pale yellow.  It may be helpful to massage, stretch, and relax the affected muscle.  For tight or tense  muscles, use a warm towel, heating pad, or hot shower water directed to the affected area.  If you are sore or have pain after a cramp or spasm, applying ice to the affected area may relieve discomfort.  Put ice in a plastic bag.  Place a towel between your skin and the bag.  Leave the ice on for 15-20 minutes, 03-04 times a day.  Medicines used to treat a known cause of  cramps or spasms may help reduce their frequency or severity. Only take over-the-counter or prescription medicines as directed by your caregiver. SEEK MEDICAL CARE IF:  Your cramps or spasms get more severe, more frequent, or do not improve over time.  MAKE SURE YOU:   Understand these instructions.  Will watch your condition.  Will get help right away if you are not doing well or get worse.   This information is not intended to replace advice given to you by your health care provider. Make sure you discuss any questions you have with your health care provider.   Document Released: 04/19/2002 Document Revised: 02/22/2013 Document Reviewed: 10/14/2012 Elsevier Interactive Patient Education [employer]

## 2015-11-24 NOTE — ED Notes (Signed)
Lower back pain x2-3 weeks , hx of chronic condition , no recent injury recalled , ambulatory with a steady gait

## 2015-11-24 NOTE — ED Provider Notes (Signed)
[facility] Emergency Department Provider Note  ____________________________________________  Time seen: Approximately 1:14 PM  I have reviewed the triage vital signs and the nursing notes.   HISTORY  Chief Complaint Back Pain    HPI Amber Nolan is a 35 y.o. female who presents to the emergency department complaining of lower back pain 2-3 weeks. Patient states that she has a history of scoliosis and occasionally has problems with pain. She has been taking anti-inflammatories trying both ibuprofen and Aleve for symptom relief. She states that helps slightly but is not fully alleviated all symptoms. She's tried different stretching as well. She statesthe pain has been increasing. She denies any radicular symptoms at this time. Pain is worse on left versus right. She denies any urinary symptoms.   Past Medical History  Diagnosis Date  . Migraines   . Gastritis   . GERD (gastroesophageal reflux disease)   . Anxiety and depression   . Stomach ulcer   . Scoliosis     Patient Active Problem List   Diagnosis Date Noted  . Migraine with aura 01/10/2014    Past Surgical History  Procedure Laterality Date  . Appendectomy  1993  . Cholecystectomy  2014  . Tubal ligation  2013  . Exploratory laparotomy  2015    Current Outpatient Rx  Name  Route  Sig  Dispense  Refill  . ALPRAZolam (XANAX) 0.25 MG tablet   Oral   Take 0.25 mg by mouth 2 (two) times daily as needed for anxiety.         . citalopram (CELEXA) 10 MG tablet   Oral   Take 10 mg by mouth daily.         . meloxicam (MOBIC) 15 MG tablet   Oral   Take 1 tablet (15 mg total) by mouth daily.   30 tablet   0   . methocarbamol (ROBAXIN) 500 MG tablet   Oral   Take 1 tablet (500 mg total) by mouth 4 (four) times daily.   16 tablet   0   . SUMAtriptan (IMITREX) 100 MG tablet   Oral   Take 100 mg by mouth every 2 (two) hours as needed for migraine or headache. May repeat  in 2 hours if headache persists or recurs.         . traMADol (ULTRAM) 50 MG tablet   Oral   Take 1 tablet (50 mg total) by mouth every 6 (six) hours as needed.   10 tablet   0     Allergies Sulfa antibiotics  Family History  Problem Relation Age of Onset  . Liver cancer Father     LIVER  . Diabetes    . Hypertension    . Hypothyroidism    . Stroke    . Heart disease    . Bladder Cancer Neg Hx   . Prostate cancer Neg Hx   . Kidney cancer Neg Hx     Social History Social History  Substance Use Topics  . Smoking status: Never Smoker   . Smokeless tobacco: Never Used  . Alcohol Use: Yes     Comment: Social     Review of Systems  Constitutional: No fever/chills Eyes: No visual changes. No discharge ENT: No sore throat. Cardiovascular: no chest pain. Respiratory: no cough. No SOB. Gastrointestinal: No abdominal pain.  No nausea, no vomiting.  No diarrhea.  No constipation. Genitourinary: Negative for dysuria. No hematuria Musculoskeletal: Positive for back pain. Skin: Negative for  rash. Neurological: Negative for headaches, focal weakness or numbness. 10-point ROS otherwise negative.  ____________________________________________   PHYSICAL EXAM:  VITAL SIGNS: ED Triage Vitals  Enc Vitals Group     BP 11/24/15 1256 125/65 mmHg     Pulse Rate 11/24/15 1256 66     Resp 11/24/15 1256 18     Temp 11/24/15 1256 98.4 F (36.9 C)     Temp Source 11/24/15 1256 Oral     SpO2 11/24/15 1256 100 %     Weight 11/24/15 1256 200 lb (90.719 kg)     Height 11/24/15 1256 5\' 4"  (1.626 m)     Head Cir --      Peak Flow --      Pain Score 11/24/15 1257 7     Pain Loc --      Pain Edu? --      Excl. in GC? --      Constitutional: Alert and oriented. Well appearing and in no acute distress. Eyes: Conjunctivae are normal. PERRL. EOMI. Head: Atraumatic. ENT:      Ears:       Nose: No congestion/rhinnorhea.      Mouth/Throat: Mucous membranes are moist.  Neck: No  stridor.  No cervical spine tenderness to palpation. Hematological/Lymphatic/Immunilogical: No cervical lymphadenopathy. Cardiovascular: Normal rate, regular rhythm. Normal S1 and S2.  Good peripheral circulation. Respiratory: Normal respiratory effort without tachypnea or retractions. Lungs CTAB. Gastrointestinal: Soft and nontender. No distention. No CVA tenderness. Musculoskeletal: No lower extremity tenderness nor edema.  No joint effusions. No visible deformity to spine upon inspection. Palpation of thoracic and lumbar spine does reveal slight scoliosis. There is no tenderness to palpation midline spinal processes. There is diffuse tenderness to palpation in the paraspinal muscle groups in the lower thoracic and upper lumbar regions. Tenderness is worse on left versus right. No palpable abnormality. Minor spasms are noted. Negative straight leg raise bilaterally. Rosales pedis pulses palpated Neurologic:  Normal speech and language. No gross focal neurologic deficits are appreciated.  Skin:  Skin is warm, dry and intact. No rash noted. Psychiatric: Mood and affect are normal. Speech and behavior are normal. Patient exhibits appropriate insight and judgement.   ____________________________________________   LABS (all labs ordered are listed, but only abnormal results are displayed)  Labs Reviewed - No data to display ____________________________________________  EKG   ____________________________________________  RADIOLOGY   No results found.  ____________________________________________    PROCEDURES  Procedure(s) performed:       Medications - No data to display   ____________________________________________   INITIAL IMPRESSION / ASSESSMENT AND PLAN / ED COURSE  Pertinent labs & imaging results that were available during my care of the patient were reviewed by me and considered in my medical decision making (see chart for details).  Patient's diagnosis is  consistent with scoliosis with lumbar paraspinal muscle spasms. Patient will be discharged home with prescriptions for anti-inflammatories, muscle relaxers, and limited pain medicine for symptom control. Patient is to follow up with her orthopedic surgeon, Dr. Martha ClanKrasinski, if symptoms persist past this treatment course. Patient is given ED precautions to return to the ED for any worsening or new symptoms.     ____________________________________________  FINAL CLINICAL IMPRESSION(S) / ED DIAGNOSES  Final diagnoses:  Scoliosis  Lumbar paraspinal muscle spasm      NEW MEDICATIONS STARTED DURING THIS VISIT:  New Prescriptions   MELOXICAM (MOBIC) 15 MG TABLET    Take 1 tablet (15 mg total) by mouth daily.   METHOCARBAMOL (  ROBAXIN) 500 MG TABLET    Take 1 tablet (500 mg total) by mouth 4 (four) times daily.   TRAMADOL (ULTRAM) 50 MG TABLET    Take 1 tablet (50 mg total) by mouth every 6 (six) hours as needed.        Delorise Royals Retha Bither, PA-C 11/24/15 1332  Sharman Cheek, MD 11/25/15 1325

## 2015-12-19 ENCOUNTER — Ambulatory Visit: Payer: Medicaid Other | Admitting: Urology

## 2015-12-20 ENCOUNTER — Encounter: Payer: Self-pay | Admitting: Urology

## 2015-12-20 ENCOUNTER — Ambulatory Visit: Payer: Medicaid Other | Admitting: Urology

## 2016-01-05 ENCOUNTER — Encounter: Payer: Medicaid Other | Admitting: Urology

## 2016-01-05 ENCOUNTER — Encounter: Payer: Self-pay | Admitting: Urology

## 2016-01-07 NOTE — Progress Notes (Signed)
Erroneous encounter, patient was a no show. This encounter was created in error - please disregard.

## 2016-02-09 ENCOUNTER — Encounter: Payer: Self-pay | Admitting: Emergency Medicine

## 2016-02-09 ENCOUNTER — Emergency Department
Admission: EM | Admit: 2016-02-09 | Discharge: 2016-02-09 | Disposition: A | Discharge status: Home / Self Care | Payer: Medicaid Other | Attending: Student | Admitting: Student

## 2016-02-09 ENCOUNTER — Emergency Department: Payer: Medicaid Other

## 2016-02-09 DIAGNOSIS — M419 Scoliosis, unspecified: Secondary | ICD-10-CM | POA: Insufficient documentation

## 2016-02-09 DIAGNOSIS — W19XXXA Unspecified fall, initial encounter: Secondary | ICD-10-CM | POA: Diagnosis not present

## 2016-02-09 DIAGNOSIS — Y929 Unspecified place or not applicable: Secondary | ICD-10-CM | POA: Diagnosis not present

## 2016-02-09 DIAGNOSIS — Y999 Unspecified external cause status: Secondary | ICD-10-CM | POA: Diagnosis not present

## 2016-02-09 DIAGNOSIS — S63601A Unspecified sprain of right thumb, initial encounter: Secondary | ICD-10-CM | POA: Diagnosis not present

## 2016-02-09 DIAGNOSIS — Y939 Activity, unspecified: Secondary | ICD-10-CM | POA: Diagnosis not present

## 2016-02-09 DIAGNOSIS — F329 Major depressive disorder, single episode, unspecified: Secondary | ICD-10-CM | POA: Diagnosis not present

## 2016-02-09 DIAGNOSIS — M79644 Pain in right finger(s): Secondary | ICD-10-CM | POA: Diagnosis present

## 2016-02-09 MED ORDER — NAPROXEN 500 MG PO TABS: 500.0000 mg | ORAL_TABLET | Freq: Two times a day (BID) | ORAL | Status: DC

## 2016-02-09 MED ORDER — TRAMADOL HCL 50 MG PO TABS: 50.0000 mg | ORAL_TABLET | Freq: Four times a day (QID) | ORAL | Status: DC | PRN

## 2016-02-09 NOTE — Discharge Instructions (Signed)
With thumb spica splint  for 3-5 days as needed.

## 2016-02-09 NOTE — ED Provider Notes (Signed)
[facility] Emergency Department Provider Note  ____________________________________________  Time seen: Approximately 2:56 PM  I have reviewed the triage vital signs and the nursing notes.   HISTORY  Chief Complaint Fall    HPI Amber Nolan is a 35 y.o. female patient complain of right wrist and right thumb pain secondary to a fall which occurred yesterday. Patient stated this continued pain to the thumb and wrist. Patient also state there is some intermitting numbness to the first and second digit of the affected hand. Patient is taking ibuprofen and ice pack was applied upon arrival to ED. No positive measures for this complaint. She rates the pain discomfort as a 7/10. Patient described the pain is "achy".   Past Medical History  Diagnosis Date  . Migraines   . Gastritis   . GERD (gastroesophageal reflux disease)   . Anxiety and depression   . Stomach ulcer   . Scoliosis     Patient Active Problem List   Diagnosis Date Noted  . Migraine with aura 01/10/2014    Past Surgical History  Procedure Laterality Date  . Appendectomy  1993  . Cholecystectomy  2014  . Tubal ligation  2013  . Exploratory laparotomy  2015    Current Outpatient Rx  Name  Route  Sig  Dispense  Refill  . ALPRAZolam (XANAX) 0.25 MG tablet   Oral   Take 0.25 mg by mouth 2 (two) times daily as needed for anxiety.         . citalopram (CELEXA) 10 MG tablet   Oral   Take 10 mg by mouth daily.         . meloxicam (MOBIC) 15 MG tablet   Oral   Take 1 tablet (15 mg total) by mouth daily.   30 tablet   0   . methocarbamol (ROBAXIN) 500 MG tablet   Oral   Take 1 tablet (500 mg total) by mouth 4 (four) times daily.   16 tablet   0   . SUMAtriptan (IMITREX) 100 MG tablet   Oral   Take 100 mg by mouth every 2 (two) hours as needed for migraine or headache. May repeat in 2 hours if headache persists or recurs.         . traMADol (ULTRAM) 50 MG  tablet   Oral   Take 1 tablet (50 mg total) by mouth every 6 (six) hours as needed.   10 tablet   0     Allergies Sulfa antibiotics  Family History  Problem Relation Age of Onset  . Liver cancer Father     LIVER  . Diabetes    . Hypertension    . Hypothyroidism    . Stroke    . Heart disease    . Bladder Cancer Neg Hx   . Prostate cancer Neg Hx   . Kidney cancer Neg Hx     Social History Social History  Substance Use Topics  . Smoking status: Never Smoker   . Smokeless tobacco: Never Used  . Alcohol Use: Yes     Comment: Social    Review of Systems Constitutional: No fever/chills Eyes: No visual changes. ENT: No sore throat. Cardiovascular: Denies chest pain. Respiratory: Denies shortness of breath. Gastrointestinal: No abdominal pain.  No nausea, no vomiting.  No diarrhea.  No constipation. Genitourinary: Negative for dysuria. Musculoskeletal: Right thumb and wrist pain  Skin: Negative for rash. Neurological: Negative for headaches, focal weakness or numbness.    ____________________________________________  PHYSICAL EXAM:  VITAL SIGNS: ED Triage Vitals  Enc Vitals Group     BP 02/09/16 1407 126/88 mmHg     Pulse Rate 02/09/16 1407 77     Resp 02/09/16 1407 18     Temp 02/09/16 1407 98.2 F (36.8 C)     Temp Source 02/09/16 1407 Oral     SpO2 02/09/16 1407 99 %     Weight 02/09/16 1407 200 lb (90.719 kg)     Height 02/09/16 1407  (1.626 m)     Head Cir --      Peak Flow --      Pain Score 02/09/16 1408 7     Pain Loc --      Pain Edu? --      Excl. in GC? --     Constitutional: Alert and oriented. Well appearing and in no acute distress. Eyes: Conjunctivae are normal. PERRL. EOMI. Head: Atraumatic. Nose: No congestion/rhinnorhea. Mouth/Throat: Mucous membranes are moist.  Oropharynx non-erythematous. Neck: No stridor. No cervical spine tenderness to palpation. Hematological/Lymphatic/Immunilogical: No cervical  lymphadenopathy. Cardiovascular: Normal rate, regular rhythm. Grossly normal heart sounds.  Good peripheral circulation. Respiratory: Normal respiratory effort.  No retractions. Lungs CTAB. Gastrointestinal: Soft and nontender. No distention. No abdominal bruits. No CVA tenderness. Musculoskeletal: No obvious deformity to the thumb and wrist. There is ecchymosis to the first digit left hand. Moderate edema to the right wrist radial aspect. Decreased range of motion with extension of the wrist and the thumb. Neurologic:  Normal speech and language. No gross focal neurologic deficits are appreciated. No gait instability. Skin:  Skin is warm, dry and intact. No rash noted. Psychiatric: Mood and affect are normal. Speech and behavior are normal.  ____________________________________________   LABS (all labs ordered are listed, but only abnormal results are displayed)  Labs Reviewed - No data to display ____________________________________________  EKG   ____________________________________________  RADIOLOGY  No acute findings on x-ray. ____________________________________________   PROCEDURES  Procedure(s) performed: None  Critical Care performed: No  ____________________________________________   INITIAL IMPRESSION / ASSESSMENT AND PLAN / ED COURSE  Pertinent labs & imaging results that were available during my care of the patient were reviewed by me and considered in my medical decision making (see chart for details).  Sprain right thumb and wrist. Discussed x-ray findings with patient. Patient placed in a thumb spica splint. Patient given discharge care instructions. Patient given a prescription for tramadol and ibuprofen. ____________________________________________   FINAL CLINICAL IMPRESSION(S) / ED DIAGNOSES  Final diagnoses:  Sprain of right thumb, initial encounter       Joni Reining, PA-C 02/09/16 1503  Gayla Doss, MD 02/09/16 1620

## 2016-02-09 NOTE — ED Notes (Signed)
Reports fell yesterday and landed on right wrist.  C/o pain in right wrist to right thumb.

## 2016-06-01 ENCOUNTER — Emergency Department
Admission: EM | Admit: 2016-06-01 | Discharge: 2016-06-01 | Disposition: A | Discharge status: Home / Self Care | Payer: Medicaid Other | Attending: Emergency Medicine | Admitting: Emergency Medicine

## 2016-06-01 ENCOUNTER — Emergency Department: Payer: Medicaid Other

## 2016-06-01 DIAGNOSIS — Z79899 Other long term (current) drug therapy: Secondary | ICD-10-CM | POA: Insufficient documentation

## 2016-06-01 DIAGNOSIS — M545 Low back pain: Secondary | ICD-10-CM | POA: Diagnosis present

## 2016-06-01 DIAGNOSIS — R109 Unspecified abdominal pain: Secondary | ICD-10-CM | POA: Diagnosis not present

## 2016-06-01 DIAGNOSIS — N12 Tubulo-interstitial nephritis, not specified as acute or chronic: Secondary | ICD-10-CM | POA: Diagnosis not present

## 2016-06-01 LAB — COMPREHENSIVE METABOLIC PANEL
ALT: 21 U/L (ref 14–54)
ANION GAP: 10 (ref 5–15)
AST: 23 U/L (ref 15–41)
Albumin: 4.3 g/dL (ref 3.5–5.0)
Alkaline Phosphatase: 63 U/L (ref 38–126)
BILIRUBIN TOTAL: 0.9 mg/dL (ref 0.3–1.2)
BUN: 12 mg/dL (ref 6–20)
CO2: 24 mmol/L (ref 22–32)
Calcium: 8.9 mg/dL (ref 8.9–10.3)
Chloride: 103 mmol/L (ref 101–111)
Creatinine, Ser: 0.75 mg/dL (ref 0.44–1.00)
Glucose, Bld: 131 mg/dL — ABNORMAL HIGH (ref 65–99)
POTASSIUM: 3.7 mmol/L (ref 3.5–5.1)
Sodium: 137 mmol/L (ref 135–145)
TOTAL PROTEIN: 7.5 g/dL (ref 6.5–8.1)

## 2016-06-01 LAB — CBC WITH DIFFERENTIAL/PLATELET
BASOS ABS: 0.1 10*3/uL (ref 0–0.1)
Basophils Relative: 0 %
Eosinophils Absolute: 0 10*3/uL (ref 0–0.7)
Eosinophils Relative: 0 %
HEMATOCRIT: 37.9 % (ref 35.0–47.0)
HEMOGLOBIN: 13.2 g/dL (ref 12.0–16.0)
LYMPHS PCT: 7 %
Lymphs Abs: 1.3 10*3/uL (ref 1.0–3.6)
MCH: 31.5 pg (ref 26.0–34.0)
MCHC: 34.9 g/dL (ref 32.0–36.0)
MCV: 90.3 fL (ref 80.0–100.0)
MONO ABS: 1.6 10*3/uL — AB (ref 0.2–0.9)
Monocytes Relative: 9 %
NEUTROS ABS: 15.6 10*3/uL — AB (ref 1.4–6.5)
NEUTROS PCT: 84 %
Platelets: 299 10*3/uL (ref 150–440)
RBC: 4.19 MIL/uL (ref 3.80–5.20)
RDW: 13 % (ref 11.5–14.5)
WBC: 18.6 10*3/uL — ABNORMAL HIGH (ref 3.6–11.0)

## 2016-06-01 LAB — URINALYSIS COMPLETE WITH MICROSCOPIC (ARMC ONLY)
Bilirubin Urine: NEGATIVE
GLUCOSE, UA: NEGATIVE mg/dL
KETONES UR: NEGATIVE mg/dL
NITRITE: POSITIVE — AB
Protein, ur: 30 mg/dL — AB
SPECIFIC GRAVITY, URINE: 1.011 (ref 1.005–1.030)
pH: 7 (ref 5.0–8.0)

## 2016-06-01 LAB — POCT PREGNANCY, URINE: Preg Test, Ur: NEGATIVE

## 2016-06-01 MED ORDER — IBUPROFEN 600 MG PO TABS
ORAL_TABLET | ORAL | Status: AC
  Filled 2016-06-01: qty 1

## 2016-06-01 MED ORDER — CEFIXIME 400 MG PO TABS: 400.0000 mg | ORAL_TABLET | Freq: Every day | ORAL | Status: DC

## 2016-06-01 MED ORDER — OXYCODONE-ACETAMINOPHEN 5-325 MG PO TABS: 1.0000 | ORAL_TABLET | Freq: Four times a day (QID) | ORAL | Status: DC | PRN

## 2016-06-01 MED ORDER — IBUPROFEN 600 MG PO TABS
600.0000 mg | ORAL_TABLET | Freq: Once | ORAL | Status: AC
  Administered 2016-06-01: 600 mg via ORAL

## 2016-06-01 MED ORDER — DEXTROSE 5 % IV SOLN
1.0000 g | INTRAVENOUS | Status: DC
  Administered 2016-06-01: 1 g via INTRAVENOUS
  Filled 2016-06-01: qty 10

## 2016-06-01 MED ORDER — DIPHENHYDRAMINE HCL 50 MG/ML IJ SOLN
25.0000 mg | Freq: Once | INTRAMUSCULAR | Status: AC
  Administered 2016-06-01: 25 mg via INTRAVENOUS
  Filled 2016-06-01: qty 1

## 2016-06-01 MED ORDER — ONDANSETRON 4 MG PO TBDP: 4.0000 mg | ORAL_TABLET | Freq: Three times a day (TID) | ORAL | Status: DC | PRN

## 2016-06-01 MED ORDER — MORPHINE SULFATE (PF) 4 MG/ML IV SOLN
4.0000 mg | Freq: Once | INTRAVENOUS | Status: AC
  Administered 2016-06-01: 4 mg via INTRAVENOUS
  Filled 2016-06-01: qty 1

## 2016-06-01 MED ORDER — ONDANSETRON HCL 4 MG/2ML IJ SOLN
4.0000 mg | Freq: Once | INTRAMUSCULAR | Status: AC
  Administered 2016-06-01: 4 mg via INTRAVENOUS
  Filled 2016-06-01: qty 2

## 2016-06-01 MED ORDER — SODIUM CHLORIDE 0.9 % IV BOLUS (SEPSIS)
1000.0000 mL | Freq: Once | INTRAVENOUS | Status: AC
Start: 2016-06-01 — End: 2016-06-01
  Administered 2016-06-01: 1000 mL via INTRAVENOUS

## 2016-06-01 NOTE — ED Notes (Signed)
NAD noted at this time. Pt resting in bed with SO at bedside. This RN explained delay to patient, explained that MD had put in for a CT scan and would delay D/C. Pt states understanding at this time. Denies needs, states pain 5/10. Will continue to monitor for further patient needs at this time.

## 2016-06-01 NOTE — Discharge Instructions (Signed)

## 2016-06-01 NOTE — ED Notes (Addendum)
Patient reports lower back pain for 2 weeks (reports history of chronic UTIs).  Today with all over generalized body aches, nausea and feeling feverish at home.

## 2016-06-01 NOTE — ED Provider Notes (Signed)
[facility] Emergency Department Provider Note   ____________________________________________  Time seen: Approximately 4:26 AM  I have reviewed the triage vital signs and the nursing notes.   HISTORY  Chief Complaint Back Pain and Generalized Body Aches    HPI Amber Nolan is a 35 y.o. female comes into the hospital today with bilateral flank pain. The patient reports that she was having some pain in her lower back for a couple of weeks. The patient thought it was due to her scoliosis. She reports that she had gone to her doctor's office last week June and had a negative urine culture. She reports that she was treated for bacterial vaginosis didn't think anything of it. She reports that since the end of June she has been having these continued symptoms including frequent urination and pressure. The patient reports that today she felt very tired that she's been unable to sleep, she's had some body aches and a low-grade temperature to 100. She reports that she's having some chills and nausea with dry heaves. The patient is been taking ibuprofen, Tylenol and Aleve. The patient is on Cystex daily. She had been off of it the end of June because she forgot to fill her prescription but was put back on it. She reports that she had been on a three-day course of Cipro but since everything was negative she stopped taking it. The patient denies any abdominal pain. The patient rates her pain a 9 out of 10 in intensity. She is here for evaluation.   Past Medical History  Diagnosis Date  . Migraines   . Gastritis   . GERD (gastroesophageal reflux disease)   . Anxiety and depression   . Stomach ulcer   . Scoliosis     Patient Active Problem List   Diagnosis Date Noted  . Migraine with aura 01/10/2014    Past Surgical History  Procedure Laterality Date  . Appendectomy  1993  . Cholecystectomy  2014  . Tubal ligation  2013  . Exploratory laparotomy  2015     Current Outpatient Rx  Name  Route  Sig  Dispense  Refill  . ALPRAZolam (XANAX) 0.25 MG tablet   Oral   Take 0.25 mg by mouth 2 (two) times daily as needed for anxiety.         . citalopram (CELEXA) 10 MG tablet   Oral   Take 10 mg by mouth daily.         . meloxicam (MOBIC) 15 MG tablet   Oral   Take 1 tablet (15 mg total) by mouth daily.   30 tablet   0   . methocarbamol (ROBAXIN) 500 MG tablet   Oral   Take 1 tablet (500 mg total) by mouth 4 (four) times daily.   16 tablet   0   . naproxen (NAPROSYN) 500 MG tablet   Oral   Take 1 tablet (500 mg total) by mouth 2 (two) times daily with a meal.   20 tablet   0   . SUMAtriptan (IMITREX) 100 MG tablet   Oral   Take 100 mg by mouth every 2 (two) hours as needed for migraine or headache. May repeat in 2 hours if headache persists or recurs.         . traMADol (ULTRAM) 50 MG tablet   Oral   Take 1 tablet (50 mg total) by mouth every 6 (six) hours as needed.   10 tablet   0   .  traMADol (ULTRAM) 50 MG tablet   Oral   Take 1 tablet (50 mg total) by mouth every 6 (six) hours as needed for moderate pain.   12 tablet   0     Allergies Sulfa antibiotics  Family History  Problem Relation Age of Onset  . Liver cancer Father     LIVER  . Diabetes    . Hypertension    . Hypothyroidism    . Stroke    . Heart disease    . Bladder Cancer Neg Hx   . Prostate cancer Neg Hx   . Kidney cancer Neg Hx     Social History Social History  Substance Use Topics  . Smoking status: Never Smoker   . Smokeless tobacco: Never Used  . Alcohol Use: Yes     Comment: Social    Review of Systems Constitutional:  fever/chills Eyes: No visual changes. ENT: No sore throat. Cardiovascular: Denies chest pain. Respiratory: Denies shortness of breath. Gastrointestinal:  nausea, no vomiting.  No diarrhea.  No constipation. Genitourinary: Negative for dysuria. Musculoskeletal: back pain. Skin: Negative for  rash. Neurological: Negative for headaches, focal weakness or numbness.  10-point ROS otherwise negative.  ____________________________________________   PHYSICAL EXAM:  VITAL SIGNS: ED Triage Vitals  Enc Vitals Group     BP 06/01/16 0127 151/91 mmHg     Pulse Rate 06/01/16 0127 97     Resp 06/01/16 0127 20     Temp 06/01/16 0127 99.9 F (37.7 C)     Temp Source 06/01/16 0127 Oral     SpO2 06/01/16 0127 97 %     Weight 06/01/16 0127 200 lb (90.719 kg)     Height 06/01/16 0127 5\' 4"  (1.626 m)     Head Cir --      Peak Flow --      Pain Score 06/01/16 0128 9     Pain Loc --      Pain Edu? --      Excl. in GC? --     Constitutional: Alert and oriented. Well appearing and inModerate distress. Eyes: Conjunctivae are normal. PERRL. EOMI. Head: Atraumatic. Nose: No congestion/rhinnorhea. Mouth/Throat: Mucous membranes are moist.  Oropharynx non-erythematous. Cardiovascular: Normal rate, regular rhythm. Grossly normal heart sounds.  Good peripheral circulation. Respiratory: Normal respiratory effort.  No retractions. Lungs CTAB. Gastrointestinal: Soft and nontender. No distention. Positive bowel sounds with bilateral CVA tenderness to palpation Musculoskeletal: No lower extremity tenderness nor edema.   Neurologic:  Normal speech and language.  Skin:  Skin is warm, dry and intact.  Psychiatric: Mood and affect are normal.   ____________________________________________   LABS (all labs ordered are listed, but only abnormal results are displayed)  Labs Reviewed  URINALYSIS COMPLETEWITH MICROSCOPIC ([facility] ONLY) - Abnormal; Notable for the following:    Color, Urine YELLOW (*)    APPearance CLOUDY (*)    Hgb urine dipstick 1+ (*)    Protein, ur 30 (*)    Nitrite POSITIVE (*)    Leukocytes, UA 2+ (*)    Bacteria, UA RARE (*)    Squamous Epithelial / LPF 0-5 (*)    All other components within normal limits  CBC WITH DIFFERENTIAL/PLATELET - Abnormal; Notable for the  following:    WBC 18.6 (*)    Neutro Abs 15.6 (*)    Monocytes Absolute 1.6 (*)    All other components within normal limits  COMPREHENSIVE METABOLIC PANEL - Abnormal; Notable for the following:    Glucose, Bld 131 (*)  All other components within normal limits  URINE CULTURE  POC URINE PREG, ED  POCT PREGNANCY, URINE   ____________________________________________  EKG  None ____________________________________________  RADIOLOGY  None ____________________________________________   PROCEDURES  Procedure(s) performed: None  Procedures  Critical Care performed: No  ____________________________________________   INITIAL IMPRESSION / ASSESSMENT AND PLAN / ED COURSE  Pertinent labs & imaging results that were available during my care of the patient were reviewed by me and considered in my medical decision making (see chart for details).  This is a 35 year old female who comes into the hospital today with some bilateral flank pain it's been going on for a few weeks. The patient reports that she's had some body aches and chills as well as some low-grade temperatures. It appears that the patient's urine is infected. The patient has positive nitrates and too numerous to count white blood cells. The patient also has a white blood cell count of 18. I will give the patient liter of normal saline as well as some morphine, Zofran and ceftriaxone. I will reassess the patient when she's had her medications.  The patient will receive a CT scan to evaluate for kidney stones. Her care will be signed out to Dr. Darnelle Catalan who will follow up the results of the CT scan ____________________________________________   FINAL CLINICAL IMPRESSION(S) / ED DIAGNOSES  Final diagnoses:  Pyelonephritis      NEW MEDICATIONS STARTED DURING THIS VISIT:  New Prescriptions   No medications on file     Note:  This document was prepared using Dragon voice recognition software and may include  unintentional dictation errors.    Rebecka Apley, MD 06/01/16 [phone]

## 2016-06-01 NOTE — ED Notes (Signed)
Pt returned from CT at this time. NAD noted. Pt c/o HA, which she states is from the morphine. Will continue to monitor for further patient needs at this time.

## 2016-06-01 NOTE — ED Notes (Addendum)
Pt reports hx of chronic UTI. Pt reports she went to PCP last week of June for possible UTI, prescribed Cipro, however called after 3 days and told her urine was clean and told to d/c medication. Pt reports she has been taking cystex per Urologist.   Pt c/o of frequent urination, urinary pressure beginning last week of June. Pt reports lower back pain beginning 1st July. Pt c/o fever/chills/body aches beginning this morning

## 2016-06-01 NOTE — ED Notes (Signed)
Pt began to c/o of itching at iv site, and extending 6" up forearm after medication administration. MD Zenda Alpers informed. MD Zenda Alpers ordered 25 mg Benadryl.

## 2016-06-01 NOTE — ED Provider Notes (Signed)
CT scan shows some haziness stranding around the right kidney there are some intrarenal stones no structure in this consistent with pyelonephritis we'll discharge the patient she has follow-up and she supposed to get.  Arnaldo Natal, MD 06/01/16 [phone]

## 2016-06-03 LAB — URINE CULTURE

## 2016-06-04 NOTE — Progress Notes (Signed)
Patient visited ED on 06/01/16 with a final diagnoses of pyelonephritis. Urine culture results came back 07/24 and reported >100,000 colonies of E coli. Patient was discharged on cefixime 400 mg po 1 daily x 10 days. No changes in therapy needed.  Horris Latino, PharmD Pharmacy Resident 06/04/2016 3:54 PM

## 2016-06-16 NOTE — Progress Notes (Signed)
06/17/2016 9:48 AM   Amber Nolan Sep 18, 1981 [id number]  Referring provider: Phineas Real [facility] [address] [place], [place] [address]  Chief Complaint  Patient presents with  . Follow-up    ER follow up    HPI: Patient is a 35 year old Caucasian female who is referred to [place] by [facility] emergency room for pyelonephritis.  Patient was seen in November 2016 by Dr. Apolinar Junes for recurrent urinary tract infections.  There was no culture data to support this other than single culture growing pan sensitive E. Coli over the past year and recurrent bacterial vaginosis.    More recently, patient presented to the ED complaining of bilateral flank pain associated with low grade fevers, body aches, chills and nausea with dry heaves.   Her pain was 9 out of 10 in intensity.  A CT Renal stone study noted a very subtle and nonspecific interstitial stranding in the perihilar fat of the right kidney. Differential considerations include recent passage of a small renal stone versus potential ascending urinary tract infection/pyelonephritis.  Stable 5-6 mm left interpolar nephrolithiasis. No significant interval change in size or position of the stone compared to 08/16/2013.  Small hiatal hernia.  Surgical changes of prior cholecystectomy and appendectomy.  Stable dextro convex scoliosis with lower lumbar degenerative disc disease.  Urine culture was positive for pan sensitive E.coli.  WBC was 18.6.  She was discharged with Suprax, Percocet and Zofran and instructed to follow up with urology.    She did not present to our office due to insurance issues prior to ED visit.   Today, she is not having any further flank pain.  She is not having urination symptoms.  She is not having fevers, chills, nausea and/or vomiting.  She state she had a recheck with her PCP and her WBC's and urine were improved.    She does feel that she is having  symptoms of BV.     PMH: Past Medical History:  Diagnosis Date  . Anxiety and depression   . Gastritis   . GERD (gastroesophageal reflux disease)   . Migraines   . Scoliosis   . Stomach ulcer     Surgical History: Past Surgical History:  Procedure Laterality Date  . APPENDECTOMY  1993  . CHOLECYSTECTOMY  2014  . EXPLORATORY LAPAROTOMY  2015  . TUBAL LIGATION  2013    Home Medications:    Medication List       Accurate as of 06/17/16  9:48 AM. Always use your most recent med list.          citalopram 10 MG tablet Commonly known as:  CELEXA Take 10 mg by mouth daily.   CYSTEX PO Take by mouth.       Allergies:  Allergies  Allergen Reactions  . Sulfa Antibiotics     Family History: Family History  Problem Relation Age of Onset  . Liver cancer Father     LIVER  . Diabetes    . Hypertension    . Hypothyroidism    . Stroke    . Heart disease    . Bladder Cancer Neg Hx   . Prostate cancer Neg Hx   . Kidney cancer Neg Hx     Social History:  reports that she has never smoked. She has never used smokeless tobacco. She reports that she drinks alcohol. She reports that she does not use drugs.  ROS: UROLOGY Frequent Urination?: No Hard to postpone  urination?: No Burning/pain with urination?: No Get up at night to urinate?: No Leakage of urine?: No Urine stream starts and stops?: No Trouble starting stream?: No Do you have to strain to urinate?: No Blood in urine?: No Urinary tract infection?: No Sexually transmitted disease?: No Injury to kidneys or bladder?: No Painful intercourse?: No Weak stream?: No Currently pregnant?: No Vaginal bleeding?: No Last menstrual period?: n  Gastrointestinal Nausea?: No Vomiting?: No Indigestion/heartburn?: No Diarrhea?: No Constipation?: No  Constitutional Fever: No Night sweats?: No Weight loss?: No Fatigue?: No  Skin Skin rash/lesions?: No Itching?: No  Eyes Blurred vision?: No Double  vision?: No  Ears/Nose/Throat Sore throat?: No Sinus problems?: No  Hematologic/Lymphatic Swollen glands?: No Easy bruising?: Yes  Cardiovascular Leg swelling?: No Chest pain?: No  Respiratory Cough?: No Shortness of breath?: No  Endocrine Excessive thirst?: No  Musculoskeletal Back pain?: No Joint pain?: No  Neurological Headaches?: No Dizziness?: No  Psychologic Depression?: No Anxiety?: No  Physical Exam: BP 129/84   Pulse 66   Ht 5\' 4"  (1.626 m)   Wt 209 lb (94.8 kg)   LMP 04/22/2016 (Approximate) Comment: neg preg   BMI 35.87 kg/m   Constitutional: Well nourished. Alert and oriented, No acute distress. HEENT: [place] AT, moist mucus membranes. Trachea midline, no masses. Cardiovascular: No clubbing, cyanosis, or edema. Respiratory: Normal respiratory effort, no increased work of breathing. Skin: No rashes, bruises or suspicious lesions. Lymph: No cervical or inguinal adenopathy. Neurologic: Grossly intact, no focal deficits, moving all 4 extremities. Psychiatric: Normal mood and affect.  Laboratory Data: Lab Results  Component Value Date   WBC 18.6 (H) 06/01/2016   HGB 13.2 06/01/2016   HCT 37.9 06/01/2016   MCV 90.3 06/01/2016   PLT 299 06/01/2016    Lab Results  Component Value Date   CREATININE 0.75 06/01/2016     Lab Results  Component Value Date   TSH 0.121 (L) 05/30/2014       Component Value Date/Time   CHOL 104 05/01/2013 0525   HDL 42 05/01/2013 0525   VLDL 13 05/01/2013 0525   LDLCALC 49 05/01/2013 0525    Lab Results  Component Value Date   AST 23 06/01/2016   Lab Results  Component Value Date   ALT 21 06/01/2016     Pertinent Imaging: CLINICAL DATA:  35 year old female with a history of chronic urinary tract infection. Prolonged frequent urination, urinary pressure and lower back pain for the past 1-2 months. New onset fever, chills and body aches today.  EXAM: CT ABDOMEN AND PELVIS WITHOUT  CONTRAST  TECHNIQUE: Multidetector CT imaging of the abdomen and pelvis was performed following the standard protocol without IV contrast.  COMPARISON:  Prior CT scan abdomen and pelvis 08/16/2013  FINDINGS: Lower chest:  No acute findings.  Small hiatal hernia.  Hepatobiliary: No mass visualized on this un-enhanced exam. The gallbladder is surgically absent. No intra or extrahepatic biliary ductal dilatation.  Pancreas: No mass or inflammatory process identified on this un-enhanced exam.  Spleen: Within normal limits in size.  Adrenals/Urinary Tract: Stable 5-6 mm stone in the interpolar collecting system of the left kidney. No significant interval change compared to October of 2014. No new nephrolithiasis. No evidence of hydronephrosis. No renal contour abnormality to suggest an exophytic lesion. Very subtle interstitial stranding of the perihilar fat adjacent to the right kidney. Normal adrenal glands.  Stomach/Bowel: No evidence of obstruction, inflammatory process, or abnormal fluid collections. The appendix is surgically absent.  Vascular/Lymphatic: No pathologically  enlarged lymph nodes. No evidence of abdominal aortic aneurysm.  Reproductive: No mass or other significant abnormality.  Other: None.  Musculoskeletal: No suspicious bone lesions identified. Dextro convex scoliosis as seen on prior imaging. L5-S1 degenerative disc disease.  IMPRESSION: 1. Very subtle and nonspecific interstitial stranding in the perihilar fat of the right kidney. Differential considerations include recent passage of a small renal stone versus potential ascending urinary tract infection/pyelonephritis. 2. Stable 5-6 mm left interpolar nephrolithiasis. No significant interval change in size or position of the stone compared to 08/16/2013. 3. Small hiatal hernia. 4. Surgical changes of prior cholecystectomy and appendectomy. 5. Stable dextro convex scoliosis with lower  lumbar degenerative disc disease.   Electronically Signed   By: Malachy Moan M.D.   On: 06/01/2016 08:59   Assessment & Plan:    1. Right pyelonephrosis  - resolved  - advised patient that we would like her to present to our office for symptoms of UTI's  - reviewed UTI precautions  2. Left renal stone  - monitor with yearly KUB's  - advised to contact our office or seek treatment in the ED if becomes febrile or pain/ vomiting are difficult control in order to arrange for emergent/urgent intervention  I spent 25 minutes in a face to face conversation with the patient concerning UTI prevention.  Greater than 50% was spent in counseling & coordination of care with the patient.  Return in about 1 year (around 06/17/2017) for KUB and office visit.  These notes generated with voice recognition software. I apologize for typographical errors.  Michiel Cowboy, PA-C  [facility] [address], [address] [place], [place] [address] [phone]

## 2016-06-17 ENCOUNTER — Encounter: Payer: Self-pay | Admitting: Urology

## 2016-06-17 ENCOUNTER — Ambulatory Visit (INDEPENDENT_AMBULATORY_CARE_PROVIDER_SITE_OTHER): Payer: Medicaid Other | Admitting: Urology

## 2016-06-17 VITALS — BP 129/84 | HR 66 | Ht 64.0 in | Wt 209.0 lb

## 2016-06-17 DIAGNOSIS — N12 Tubulo-interstitial nephritis, not specified as acute or chronic: Secondary | ICD-10-CM | POA: Diagnosis not present

## 2016-06-17 DIAGNOSIS — N200 Calculus of kidney: Secondary | ICD-10-CM

## 2016-06-17 NOTE — Patient Instructions (Signed)

## 2016-08-09 ENCOUNTER — Ambulatory Visit (INDEPENDENT_AMBULATORY_CARE_PROVIDER_SITE_OTHER): Payer: Medicaid Other

## 2016-08-09 ENCOUNTER — Telehealth: Payer: Self-pay | Admitting: Urology

## 2016-08-09 VITALS — BP 134/90 | HR 72 | Temp 98.0°F | Wt 214.1 lb

## 2016-08-09 DIAGNOSIS — N390 Urinary tract infection, site not specified: Secondary | ICD-10-CM | POA: Diagnosis not present

## 2016-08-09 LAB — URINALYSIS, COMPLETE
BILIRUBIN UA: NEGATIVE
GLUCOSE, UA: NEGATIVE
Ketones, UA: NEGATIVE
NITRITE UA: NEGATIVE
PROTEIN UA: NEGATIVE
RBC UA: NEGATIVE
Specific Gravity, UA: 1.02 (ref 1.005–1.030)
UUROB: 0.2 mg/dL (ref 0.2–1.0)
pH, UA: 5 (ref 5.0–7.5)

## 2016-08-09 LAB — MICROSCOPIC EXAMINATION

## 2016-08-09 NOTE — Telephone Encounter (Signed)
Patient is coming in today to do a UA for possible UTI. She has cloudy urine, with odor and burning.  Just wanted you to be aware to look out for her culture if we send one out.  Amber Nolan

## 2016-08-09 NOTE — Progress Notes (Signed)
Pt came in today with c/o urinary frequency, hard to postpone urination, foul smelling urine, lower abd pain, and urinary urgency. Pt provided a clean catch urine for u/a and cx.   Blood pressure 134/90, pulse 72, temperature 98 F (36.7 C), weight 214 lb 1.6 oz (97.1 kg).

## 2016-08-13 ENCOUNTER — Telehealth: Payer: Self-pay

## 2016-08-13 DIAGNOSIS — N390 Urinary tract infection, site not specified: Secondary | ICD-10-CM

## 2016-08-13 NOTE — Telephone Encounter (Signed)
Spoke with pt in reference to prelim results. Pt voiced understanding. Pt stated that she takes cystex daily. Pt wanted to know if that would affect her ucx results. Please advise. Yes orders are placed under your name.

## 2016-08-13 NOTE — Telephone Encounter (Signed)
Patient's final results are not available.  It is still preliminary.  The prelim didn't come to my box.  Is the culture ordered under me.

## 2016-08-13 NOTE — Telephone Encounter (Signed)
Pt called wanting ucx results. Pt stated that she is continuing to have UTI like symptoms. Please advise.

## 2016-08-14 LAB — CULTURE, URINE COMPREHENSIVE

## 2016-08-14 MED ORDER — AMOXICILLIN-POT CLAVULANATE 875-125 MG PO TABS: 1.0000 | ORAL_TABLET | Freq: Two times a day (BID) | ORAL | 0 refills | Status: AC

## 2016-08-14 NOTE — Telephone Encounter (Signed)
-----   Message from Harle BattiestShannon A McGowan, PA-C sent at 08/14/2016  1:40 PM EDT ----- Patient's needs to start Augmentin 875/125, one twice daily for seven days.

## 2016-09-20 ENCOUNTER — Ambulatory Visit: Payer: Medicaid Other

## 2016-09-20 ENCOUNTER — Telehealth: Payer: Self-pay | Admitting: Urology

## 2016-09-20 DIAGNOSIS — R300 Dysuria: Secondary | ICD-10-CM

## 2016-09-20 LAB — URINALYSIS, COMPLETE
BILIRUBIN UA: NEGATIVE
Glucose, UA: NEGATIVE
KETONES UA: NEGATIVE
Nitrite, UA: NEGATIVE
PH UA: 6 (ref 5.0–7.5)
RBC UA: NEGATIVE
UUROB: 0.2 mg/dL (ref 0.2–1.0)

## 2016-09-20 LAB — MICROSCOPIC EXAMINATION: Epithelial Cells (non renal): >10 /hpf — AB (ref 0–10)

## 2016-09-20 NOTE — Telephone Encounter (Signed)
Amber HerterShannon,       Ms. Amber Ridgelaylor is coming in today because she thinks she has a UTI. She's on the Nurse's schedule. Her symptoms are: burning, pressure, urinating often, and a bad odor from the urine. Just an FYI to you.   Thanks,  Clarice M.

## 2016-09-23 LAB — CULTURE, URINE COMPREHENSIVE

## 2016-09-24 ENCOUNTER — Telehealth: Payer: Self-pay

## 2016-09-24 DIAGNOSIS — N390 Urinary tract infection, site not specified: Secondary | ICD-10-CM

## 2016-09-24 MED ORDER — AMOXICILLIN-POT CLAVULANATE 875-125 MG PO TABS: 1.0000 | ORAL_TABLET | Freq: Two times a day (BID) | ORAL | 0 refills | Status: DC

## 2016-09-24 MED ORDER — AMOXICILLIN-POT CLAVULANATE 875-125 MG PO TABS: 1.0000 | ORAL_TABLET | Freq: Two times a day (BID) | ORAL | 0 refills | Status: AC

## 2016-09-24 NOTE — Telephone Encounter (Signed)
-----   Message from Harle BattiestShannon A McGowan, PA-C sent at 09/24/2016  8:22 AM EST ----- Patient has a +UCx.  They need to start Augmentin 875/125, one tablet twice daily for seven days.  They also need to take a probiotic with the antibiotic course.  The dosage is listed below:  L. acidophilus and L. casei (25 x 109 CFU/day for 2 days, then 50 x 109 CFU/day for duration of the antibiotic course)

## 2016-09-24 NOTE — Addendum Note (Signed)
Addended by: Rupert StacksWATKINS, Narvel Kozub C on: 09/24/2016 02:02 PM   Modules accepted: Orders

## 2016-09-24 NOTE — Telephone Encounter (Signed)
Spoke with pt in reference to +ucx and abx. Pt voiced understanding.  

## 2017-06-22 NOTE — Progress Notes (Signed)
06/23/2017 10:05 AM   Amber Nolan Apr 02, 1981 [id number]  Referring provider: [facility] [address] [place], [place] [address]  Chief Complaint  Patient presents with  . Pyelonephritis    1 year follow up  . Results    KUB    HPI: Patient is a 36 year old Caucasian female who presents today for a one year follow up for a history of pyelonephritis and a left renal stone.   History of pyelonephritis Patient was seen in November 2016 by Dr. Apolinar Junes for recurrent urinary tract infections.  There was no culture data to support this other than single culture growing pan sensitive E. Coli over the past year and recurrent bacterial vaginosis.   A CT Renal stone study noted a very subtle and nonspecific interstitial stranding in the perihilar fat of the right kidney. Differential considerations include recent passage of a small renal stone versus potential ascending urinary tract infection/pyelonephritis.  Stable 5-6 mm left interpolar nephrolithiasis. No significant interval change in size or position of the stone compared to 08/16/2013.  Small hiatal hernia.  Surgical changes of prior cholecystectomy and appendectomy.  Stable dextro convex scoliosis with lower lumbar degenerative disc disease.  Urine culture was positive for pan sensitive E.coli.    Today, she has been experiencing frequency, urgency, dysuria, nocturia, incontinence, intermittency, hesitancy and straining to urinate.   This has been occurring for three weeks.  She did see her PCP and the urine dip was negative.  She is not having gross hematuria, but she is experiencing suprapubic pain.  She is having a low grade fevers and chills, but she has not had nausea or vomiting.  She takes Cystex daily.  She has had a decrease in her UTI since starting her Cystex.    She feels she may have BV as well.  I have asked her to contact her PCP for a wet prep of vaginal secretions.     Left renal stone Seen on 2016 CT Renal stone study.   KUB taken today notes the stability of the left renal stone.     PMH: Past Medical History:  Diagnosis Date  . Anxiety and depression   . Gastritis   . GERD (gastroesophageal reflux disease)   . Migraines   . Scoliosis   . Stomach ulcer     Surgical History: Past Surgical History:  Procedure Laterality Date  . APPENDECTOMY  1993  . CHOLECYSTECTOMY  2014  . EXPLORATORY LAPAROTOMY  2015  . RHINOPLASTY    . TUBAL LIGATION  2013    Home Medications:  Allergies as of 06/23/2017      Reactions   Sulfa Antibiotics       Medication List       Accurate as of 06/23/17 10:05 AM. Always use your most recent med list.          ALPRAZolam 0.25 MG tablet Commonly known as:  XANAX alprazolam 0.25 mg tablet  TAKE 1 TABLET BY MOUTH DAILY AS NEEDED   busPIRone 10 MG tablet Commonly known as:  BUSPAR Take 10 mg by mouth 3 (three) times daily.   butalbital-acetaminophen-caffeine 50-325-40 MG tablet Commonly known as:  FIORICET, ESGIC butalbital-acetaminophen-caffeine 50 mg-325 mg-40 mg tablet  TAKE 1 TO 2 TABLETS BY MOUTH EVERY 4 TO 6 HOURS AS NEEDED FOR HEADACHE   citalopram 40 MG tablet Commonly known as:  CELEXA Take 40 mg by mouth daily.   CYSTEX PO Take by mouth.  diclofenac 75 MG EC tablet Commonly known as:  VOLTAREN Take 75 mg by mouth 2 (two) times daily.   doxycycline 100 MG tablet Commonly known as:  VIBRA-TABS doxycycline hyclate 100 mg tablet  TAKE 1 TABLET BY MOUTH TWICE A DAY FOR 14 DAYS   fluconazole 150 MG tablet Commonly known as:  DIFLUCAN fluconazole 150 mg tablet  TAKE 1 TABLET NOW, MAY REPEAT IN 72 HOURS ORALLY 05 DAYS   medroxyPROGESTERone 10 MG tablet Commonly known as:  PROVERA medroxyprogesterone 10 mg tablet  TAKE 1 TABLET BY MOUTH ONCE DAILY   meloxicam 15 MG tablet Commonly known as:  MOBIC meloxicam 15 mg tablet  TAKE 1 TABLET BY MOUTH DAILY   methocarbamol 500 MG  tablet Commonly known as:  ROBAXIN methocarbamol 500 mg tablet  TAKE 1 TABLET BY MOUTH 4 TIMES A DAY   methylPREDNISolone 4 MG tablet Commonly known as:  MEDROL methylprednisolone 4 mg tablets in a dose pack  TAKE BY MOUTH AS DIRECTED   metroNIDAZOLE 500 MG tablet Commonly known as:  FLAGYL metronidazole 500 mg tablet  1 TABLET TWICE A DAY ORALLY 7 DAYS   mirtazapine 30 MG tablet Commonly known as:  REMERON mirtazapine 30 mg tablet  TAKE 1 TABLET BY MOUTH AT BEDTIME   nitrofurantoin (macrocrystal-monohydrate) 100 MG capsule Commonly known as:  MACROBID nitrofurantoin monohydrate/macrocrystals 100 mg capsule  TAKE 1 CAPSULE WITH FOOD ONCE A DAY ORALLY 30 DAYS   ofloxacin 0.3 % ophthalmic solution Commonly known as:  OCUFLOX ofloxacin 0.3 % eye drops  INSTILL 1 DROP INTO RIGHT EYE FOUR TIMES A DAY   oxyCODONE-acetaminophen 5-325 MG tablet Commonly known as:  PERCOCET/ROXICET oxycodone-acetaminophen 5 mg-325 mg tablet  TAKE 1 TABLET BY MOUTH EVERY 6 HOURS AS NEEDED FOR PAIN   promethazine 25 MG suppository Commonly known as:  PHENERGAN promethazine 25 mg rectal suppository  PLACE 1 SUPPOSITORY RECTALLY EVERY 6 HRS AS NEEDED FOR NAUSEA   sertraline 50 MG tablet Commonly known as:  ZOLOFT sertraline 50 mg tablet  TAKE 1 TABLET BY MOUTH DAILY   STOOL SOFTENER 100 MG capsule Generic drug:  docusate sodium Stool Softener 100 mg capsule  TAKE 1 CAPSULE TWICE A DAY   SUMAtriptan 100 MG tablet Commonly known as:  IMITREX sumatriptan 100 mg tablet  TAKE 1 TABLET AS NEEDED TAKE AS DIRECTED ORALLY 10 DAYS   TOPAMAX PO Topamax   traMADol 50 MG tablet Commonly known as:  ULTRAM tramadol 50 mg tablet  TAKE 1 TABLET BY MOUTH EVERY 6HRS AS NEEDED       Allergies:  Allergies  Allergen Reactions  . Sulfa Antibiotics     Family History: Family History  Problem Relation Age of Onset  . Liver cancer Father        LIVER  . Diabetes Unknown   . Hypertension Unknown   .  Hypothyroidism Unknown   . Stroke Unknown   . Heart disease Unknown   . Bladder Cancer Neg Hx   . Prostate cancer Neg Hx   . Kidney cancer Neg Hx     Social History:  reports that she has never smoked. She has never used smokeless tobacco. She reports that she drinks alcohol. She reports that she does not use drugs.  ROS: UROLOGY Frequent Urination?: Yes Hard to postpone urination?: Yes Burning/pain with urination?: Yes Get up at night to urinate?: Yes Leakage of urine?: Yes Urine stream starts and stops?: Yes Trouble starting stream?: Yes Do you have to strain to  urinate?: Yes Blood in urine?: No Urinary tract infection?: Yes Sexually transmitted disease?: Yes Injury to kidneys or bladder?: No Painful intercourse?: Yes Weak stream?: No Currently pregnant?: No Vaginal bleeding?: No Last menstrual period?: n  Gastrointestinal Nausea?: No Vomiting?: No Indigestion/heartburn?: No Diarrhea?: No Constipation?: No  Constitutional Fever: No Night sweats?: No Weight loss?: No Fatigue?: Yes  Skin Skin rash/lesions?: No Itching?: No  Eyes Blurred vision?: No Double vision?: No  Ears/Nose/Throat Sore throat?: No Sinus problems?: No  Hematologic/Lymphatic Swollen glands?: No Easy bruising?: Yes  Cardiovascular Leg swelling?: Yes Chest pain?: No  Respiratory Cough?: No Shortness of breath?: No  Endocrine Excessive thirst?: No  Musculoskeletal Back pain?: Yes Joint pain?: Yes  Neurological Headaches?: Yes Dizziness?: No  Psychologic Depression?: Yes Anxiety?: Yes  Physical Exam: BP 121/83   Pulse 68   Ht 5\' 4"  (1.626 m)   Wt 217 lb 3.2 oz (98.5 kg)   LMP 06/09/2017   BMI 37.28 kg/m   Constitutional: Well nourished. Alert and oriented, No acute distress. HEENT: [place] AT, moist mucus membranes. Trachea midline, no masses. Cardiovascular: No clubbing, cyanosis, or edema. Respiratory: Normal respiratory effort, no increased work of  breathing. Skin: No rashes, bruises or suspicious lesions. Lymph: No cervical or inguinal adenopathy. Neurologic: Grossly intact, no focal deficits, moving all 4 extremities. Psychiatric: Normal mood and affect.  Laboratory Data: Lab Results  Component Value Date   WBC 18.6 (H) 06/01/2016   HGB 13.2 06/01/2016   HCT 37.9 06/01/2016   MCV 90.3 06/01/2016   PLT 299 06/01/2016    Lab Results  Component Value Date   CREATININE 0.75 06/01/2016     Lab Results  Component Value Date   TSH 0.121 (L) 05/30/2014       Component Value Date/Time   CHOL 104 05/01/2013 0525   HDL 42 05/01/2013 0525   VLDL 13 05/01/2013 0525   LDLCALC 49 05/01/2013 0525    Lab Results  Component Value Date   AST 23 06/01/2016   Lab Results  Component Value Date   ALT 21 06/01/2016     Pertinent Imaging: CLINICAL DATA:  Nephrolithiasis.  EXAM: ABDOMEN - 1 VIEW  COMPARISON:  CT the abdomen and pelvis 06/01/2016. Abdominal x-ray is 10/09/2015.  FINDINGS: A 5 mm nonobstructing stone in the left kidney is stable in size and position. No new stones are present. Surgical clips are present at the gallbladder fossa. The bowel gas pattern is normal. Hepatomegaly is stable. Rightward curvature of the lumbar spine is again noted.  IMPRESSION: 1. Stable nonobstructing stone in the left kidney.   Electronically Signed   By: Marin Robertshristopher  Mattern M.D.   On: 06/23/2017 09:39  I have independently reviewed the films.  Assessment & Plan:    1. History of pyelonephrosis  - resolved  - advised patient that we would like her to present to our office for symptoms of UTI's - she continues to see her PCP for UTI  - reviewed UTI precautions  2. UTI  - UA is suspicious for infection  - will send for culture - will not prescribe antibiotic until culture is available  - advised patient to obtain CATH UA's in the future  3. Left renal stone  - monitor with yearly KUB's - stone is  stable  - advised to contact our office or seek treatment in the ED if becomes febrile or pain/ vomiting are difficult control in order to arrange for emergent/urgent intervention  Return for pending urine culture results.  These notes generated with voice recognition software. I apologize for typographical errors.  Zara Council, [facility] [address], [place], [place] [address] [phone]

## 2017-06-23 ENCOUNTER — Ambulatory Visit (INDEPENDENT_AMBULATORY_CARE_PROVIDER_SITE_OTHER): Payer: Self-pay | Admitting: Urology

## 2017-06-23 ENCOUNTER — Other Ambulatory Visit: Payer: Self-pay | Admitting: Urology

## 2017-06-23 ENCOUNTER — Ambulatory Visit
Admission: RE | Admit: 2017-06-23 | Discharge: 2017-06-23 | Disposition: A | Discharge status: Home / Self Care | Payer: Medicaid Other | Source: Ambulatory Visit | Attending: Urology | Admitting: Urology

## 2017-06-23 ENCOUNTER — Encounter: Payer: Self-pay | Admitting: Urology

## 2017-06-23 VITALS — BP 121/83 | HR 68 | Ht 64.0 in | Wt 217.2 lb

## 2017-06-23 DIAGNOSIS — N200 Calculus of kidney: Secondary | ICD-10-CM | POA: Diagnosis not present

## 2017-06-23 DIAGNOSIS — Z87448 Personal history of other diseases of urinary system: Secondary | ICD-10-CM

## 2017-06-23 DIAGNOSIS — N3000 Acute cystitis without hematuria: Secondary | ICD-10-CM

## 2017-06-23 DIAGNOSIS — Z87442 Personal history of urinary calculi: Secondary | ICD-10-CM

## 2017-06-23 LAB — URINALYSIS, COMPLETE
Bilirubin, UA: NEGATIVE
GLUCOSE, UA: NEGATIVE
Ketones, UA: NEGATIVE
LEUKOCYTES UA: NEGATIVE
Nitrite, UA: POSITIVE — AB
Protein, UA: NEGATIVE
RBC, UA: NEGATIVE
Urobilinogen, Ur: 0.2 mg/dL (ref 0.2–1.0)
pH, UA: 6.5 (ref 5.0–7.5)

## 2017-06-23 LAB — MICROSCOPIC EXAMINATION: RBC, UA: NONE SEEN /hpf (ref 0–?)

## 2017-06-23 NOTE — Patient Instructions (Addendum)
Fatty Liver Fatty liver, also called hepatic steatosis or steatohepatitis, is a condition in which too much fat has built up in your liver cells. The liver removes harmful substances from your bloodstream. It produces fluids your body needs. It also helps your body use and store energy from the food you eat. In many cases, fatty liver does not cause symptoms or problems. It is often diagnosed when tests are being done for other reasons. However, over time, fatty liver can cause inflammation that may lead to more serious liver problems, such as scarring of the liver (cirrhosis). What are the causes? Causes of fatty liver may include:  Drinking too much alcohol.  Poor nutrition.  Obesity.  Cushing syndrome.  Diabetes.  Hyperlipidemia.  Pregnancy.  Certain drugs.  Poisons.  Some viral infections.  What increases the risk? You may be more likely to develop fatty liver if you:  Abuse alcohol.  Are pregnant.  Are overweight.  Have diabetes.  Have hepatitis.  Have a high triglyceride level.  What are the signs or symptoms? Fatty liver often does not cause any symptoms. In cases where symptoms develop, they can include:  Fatigue.  Weakness.  Weight loss.  Confusion.  Abdominal pain.  Yellowing of your skin and the white parts of your eyes (jaundice).  Nausea and vomiting.  How is this diagnosed? Fatty liver may be diagnosed by:  Physical exam and medical history.  Blood tests.  Imaging tests, such as an ultrasound, CT scan, or MRI.  Liver biopsy. A small sample of liver tissue is removed using a needle. The sample is then looked at under a microscope.  How is this treated? Fatty liver is often caused by other health conditions. Treatment for fatty liver may involve medicines and lifestyle changes to manage conditions such as:  Alcoholism.  High cholesterol.  Diabetes.  Being overweight or obese.  Follow these instructions at home:  Eat a  healthy diet as directed by your health care provider.  Exercise regularly. This can help you lose weight and control your cholesterol and diabetes. Talk to your health care provider about an exercise plan and which activities are best for you.  Do not drink alcohol.  Take medicines only as directed by your health care provider. Contact a health care provider if: You have difficulty controlling your:  Blood sugar.  Cholesterol.  Alcohol consumption.  Get help right away if:  You have abdominal pain.  You have jaundice.  You have nausea and vomiting. This information is not intended to replace advice given to you by your health care provider. Make sure you discuss any questions you have with your health care provider. Document Released: 12/13/2005 Document Revised: 04/04/2016 Document Reviewed: 03/09/2014 Elsevier Interactive Patient Education  2018 [employer]  Fatty Liver Fatty liver, also called hepatic steatosis or steatohepatitis, is a condition in which too much fat has built up in your liver cells. The liver removes harmful substances from your bloodstream. It produces fluids your body needs. It also helps your body use and store energy from the food you eat. In many cases, fatty liver does not cause symptoms or problems. It is often diagnosed when tests are being done for other reasons. However, over time, fatty liver can cause inflammation that may lead to more serious liver problems, such as scarring of the liver (cirrhosis). What are the causes? Causes of fatty liver may include:  Drinking too much alcohol.  Poor nutrition.  Obesity.  Cushing syndrome.  Diabetes.  Hyperlipidemia.  Pregnancy.  Certain drugs.  Poisons.  Some viral infections.  What increases the risk? You may be more likely to develop fatty liver if you:  Abuse alcohol.  Are pregnant.  Are overweight.  Have diabetes.  Have hepatitis.  Have a high triglyceride level.  What  are the signs or symptoms? Fatty liver often does not cause any symptoms. In cases where symptoms develop, they can include:  Fatigue.  Weakness.  Weight loss.  Confusion.  Abdominal pain.  Yellowing of your skin and the white parts of your eyes (jaundice).  Nausea and vomiting.  How is this diagnosed? Fatty liver may be diagnosed by:  Physical exam and medical history.  Blood tests.  Imaging tests, such as an ultrasound, CT scan, or MRI.  Liver biopsy. A small sample of liver tissue is removed using a needle. The sample is then looked at under a microscope.  How is this treated? Fatty liver is often caused by other health conditions. Treatment for fatty liver may involve medicines and lifestyle changes to manage conditions such as:  Alcoholism.  High cholesterol.  Diabetes.  Being overweight or obese.  Follow these instructions at home:  Eat a healthy diet as directed by your health care provider.  Exercise regularly. This can help you lose weight and control your cholesterol and diabetes. Talk to your health care provider about an exercise plan and which activities are best for you.  Do not drink alcohol.  Take medicines only as directed by your health care provider. Contact a health care provider if: You have difficulty controlling your:  Blood sugar.  Cholesterol.  Alcohol consumption.  Get help right away if:  You have abdominal pain.  You have jaundice.  You have nausea and vomiting. This information is not intended to replace advice given to you by your health care provider. Make sure you discuss any questions you have with your health care provider. Document Released: 12/13/2005 Document Revised: 04/04/2016 Document Reviewed: 03/09/2014 Elsevier Interactive Patient Education  [employer].

## 2017-06-27 LAB — CULTURE, URINE COMPREHENSIVE

## 2017-06-30 ENCOUNTER — Telehealth: Payer: Self-pay

## 2017-06-30 DIAGNOSIS — N390 Urinary tract infection, site not specified: Secondary | ICD-10-CM

## 2017-06-30 MED ORDER — AMOXICILLIN-POT CLAVULANATE 875-125 MG PO TABS: 1.0000 | ORAL_TABLET | Freq: Two times a day (BID) | ORAL | 0 refills | Status: AC

## 2017-06-30 NOTE — Telephone Encounter (Signed)
-----   Message from Harle Battiest, PA-C sent at 06/29/2017  9:09 PM EDT ----- Please let Amber Nolan know that she has an UTI.  She needs to start Augementin 875/125, one tablet twice daily #14.

## 2017-06-30 NOTE — Telephone Encounter (Signed)
Spoke with pt in reference to +ucx. Made aware abx were sent to pharmacy. Pt voiced understanding.  

## 2017-07-29 ENCOUNTER — Emergency Department
Admission: EM | Admit: 2017-07-29 | Discharge: 2017-07-29 | Disposition: A | Discharge status: Home / Self Care | Payer: Self-pay | Attending: Emergency Medicine | Admitting: Emergency Medicine

## 2017-07-29 DIAGNOSIS — Z5321 Procedure and treatment not carried out due to patient leaving prior to being seen by health care provider: Secondary | ICD-10-CM | POA: Insufficient documentation

## 2017-07-29 DIAGNOSIS — R509 Fever, unspecified: Secondary | ICD-10-CM | POA: Insufficient documentation

## 2017-07-29 LAB — BASIC METABOLIC PANEL
Anion gap: 8 (ref 5–15)
BUN: 13 mg/dL (ref 6–20)
CALCIUM: 8.9 mg/dL (ref 8.9–10.3)
CO2: 26 mmol/L (ref 22–32)
Chloride: 105 mmol/L (ref 101–111)
Creatinine, Ser: 0.8 mg/dL (ref 0.44–1.00)
GFR calc Af Amer: >60 mL/min (ref >60)
GFR calc non Af Amer: >60 mL/min (ref >60)
Glucose, Bld: 120 mg/dL — ABNORMAL HIGH (ref 65–99)
Potassium: 3.5 mmol/L (ref 3.5–5.1)
SODIUM: 139 mmol/L (ref 135–145)

## 2017-07-29 LAB — CBC
HCT: 36 % (ref 35.0–47.0)
HEMOGLOBIN: 12.6 g/dL (ref 12.0–16.0)
MCH: 32 pg (ref 26.0–34.0)
MCHC: 35.1 g/dL (ref 32.0–36.0)
MCV: 91.2 fL (ref 80.0–100.0)
Platelets: 304 10*3/uL (ref 150–440)
RBC: 3.95 MIL/uL (ref 3.80–5.20)
RDW: 13.1 % (ref 11.5–14.5)
WBC: 8.2 10*3/uL (ref 3.6–11.0)

## 2017-07-29 NOTE — ED Notes (Signed)
No answer when called several times from lobby 

## 2017-07-29 NOTE — ED Triage Notes (Addendum)
"  I know I have a UTI and I think it may have gone into a kidney infection".  Patient reporting low grade fever and left flank pain.  Patient ambulatory to triage without difficulty or distress noted.

## 2017-07-30 ENCOUNTER — Telehealth: Payer: Self-pay | Admitting: Emergency Medicine

## 2017-07-30 NOTE — Telephone Encounter (Signed)
Called patient due to lwot to inquire about condition and follow up plans. Phone is not accepting calls

## 2017-08-29 ENCOUNTER — Encounter: Payer: Self-pay | Admitting: Emergency Medicine

## 2017-08-29 ENCOUNTER — Emergency Department
Admission: EM | Admit: 2017-08-29 | Discharge: 2017-08-29 | Disposition: A | Discharge status: Home / Self Care | Payer: Self-pay | Attending: Emergency Medicine | Admitting: Emergency Medicine

## 2017-08-29 DIAGNOSIS — B9689 Other specified bacterial agents as the cause of diseases classified elsewhere: Secondary | ICD-10-CM | POA: Insufficient documentation

## 2017-08-29 DIAGNOSIS — N760 Acute vaginitis: Secondary | ICD-10-CM | POA: Insufficient documentation

## 2017-08-29 DIAGNOSIS — Z79899 Other long term (current) drug therapy: Secondary | ICD-10-CM | POA: Insufficient documentation

## 2017-08-29 LAB — URINALYSIS, COMPLETE (UACMP) WITH MICROSCOPIC
Bilirubin Urine: NEGATIVE
Glucose, UA: NEGATIVE mg/dL
Hgb urine dipstick: NEGATIVE
KETONES UR: NEGATIVE mg/dL
Leukocytes, UA: NEGATIVE
Nitrite: NEGATIVE
PH: 6 (ref 5.0–8.0)
Protein, ur: NEGATIVE mg/dL
SPECIFIC GRAVITY, URINE: 1.015 (ref 1.005–1.030)

## 2017-08-29 LAB — WET PREP, GENITAL
Sperm: NONE SEEN
TRICH WET PREP: NONE SEEN
Yeast Wet Prep HPF POC: NONE SEEN

## 2017-08-29 MED ORDER — METRONIDAZOLE 500 MG PO TABS: 500.0000 mg | ORAL_TABLET | Freq: Two times a day (BID) | ORAL | 0 refills | Status: DC

## 2017-08-29 NOTE — ED Notes (Signed)
Reports prior UTI that she never picked up anti-biotics for. Left side lumbar through thoracic area pain 6/10. No apparent injury, no swelling, no bruising. Muscle tone appears to be same on both sides of spine.

## 2017-08-29 NOTE — Discharge Instructions (Signed)
Take antibiotics as directed. Follow-up with your primary care doctor if any continued problems.

## 2017-08-29 NOTE — ED Triage Notes (Signed)
Pt with low back pain and urinary sx for over a week. Pt with hx of same.

## 2017-08-29 NOTE — ED Provider Notes (Signed)
[facility] Emergency Department Provider Note  ____________________________________________   First MD Initiated Contact with Patient 08/29/17 1314     (approximate)  I have reviewed the triage vital signs and the nursing notes.   HISTORY  Chief Complaint Recurrent UTI and Back Pain   HPI Amber Nolan is a 36 y.o. female patient is here with complaint of recurrent UTIs and back pain. Patient states that she was seen at [facility] over a month ago and was told that she had a UTI. Patient was supposed of gotten a prescription for antibiotics but did not receive that after multiple calls.she denies any fever or chills. She denies any nausea or vomiting. Patient states that there is an odor with urination. At this time she denies any vaginal discharge.se rates her pain is 7 out of 10.   Past Medical History:  Diagnosis Date  . Anxiety and depression   . Gastritis   . GERD (gastroesophageal reflux disease)   . Migraines   . Scoliosis   . Stomach ulcer     Patient Active Problem List   Diagnosis Date Noted  . Migraine with aura 01/10/2014    Past Surgical History:  Procedure Laterality Date  . APPENDECTOMY  1993  . CHOLECYSTECTOMY  2014  . EXPLORATORY LAPAROTOMY  2015  . RHINOPLASTY    . TUBAL LIGATION  2013    Prior to Admission medications   Medication Sig Start Date End Date Taking? Authorizing Provider  ALPRAZolam (XANAX) 0.25 MG tablet alprazolam 0.25 mg tablet  TAKE 1 TABLET BY MOUTH DAILY AS NEEDED    [provider]  busPIRone (BUSPAR) 10 MG tablet Take 10 mg by mouth 3 (three) times daily.    [provider]  butalbital-acetaminophen-caffeine (FIORICET, ESGIC) 50-325-40 MG tablet butalbital-acetaminophen-caffeine 50 mg-325 mg-40 mg tablet  TAKE 1 TO 2 TABLETS BY MOUTH EVERY 4 TO 6 HOURS AS NEEDED FOR HEADACHE    [provider]  citalopram (CELEXA) 40 MG tablet Take 40 mg by mouth  daily.    [provider]  diclofenac (VOLTAREN) 75 MG EC tablet Take 75 mg by mouth 2 (two) times daily.    [provider]  docusate sodium (STOOL SOFTENER) 100 MG capsule Stool Softener 100 mg capsule  TAKE 1 CAPSULE TWICE A DAY    [provider]  fluconazole (DIFLUCAN) 150 MG tablet fluconazole 150 mg tablet  TAKE 1 TABLET NOW, MAY REPEAT IN 72 HOURS ORALLY 05 DAYS    [provider]  medroxyPROGESTERone (PROVERA) 10 MG tablet medroxyprogesterone 10 mg tablet  TAKE 1 TABLET BY MOUTH ONCE DAILY    [provider]  meloxicam (MOBIC) 15 MG tablet meloxicam 15 mg tablet  TAKE 1 TABLET BY MOUTH DAILY    [provider]  Methenamine-Sodium Salicylate (CYSTEX PO) Take by mouth.    [provider]  methocarbamol (ROBAXIN) 500 MG tablet methocarbamol 500 mg tablet  TAKE 1 TABLET BY MOUTH 4 TIMES A DAY    [provider]  methylPREDNISolone (MEDROL) 4 MG tablet methylprednisolone 4 mg tablets in a dose pack  TAKE BY MOUTH AS DIRECTED    [provider]  metroNIDAZOLE (FLAGYL) 500 MG tablet Take 1 tablet (500 mg total) by mouth 2 (two) times daily. 08/29/17   Tommi RumpsSummers, Garron Eline L, PA-C  mirtazapine (REMERON) 30 MG tablet mirtazapine 30 mg tablet  TAKE 1 TABLET BY MOUTH AT BEDTIME    [provider]  nitrofurantoin, macrocrystal-monohydrate, (MACROBID)  100 MG capsule nitrofurantoin monohydrate/macrocrystals 100 mg capsule  TAKE 1 CAPSULE WITH FOOD ONCE A DAY ORALLY 30 DAYS    [provider]  ofloxacin (OCUFLOX) 0.3 % ophthalmic solution ofloxacin 0.3 % eye drops  INSTILL 1 DROP INTO RIGHT EYE FOUR TIMES A DAY    [provider]  oxyCODONE-acetaminophen (PERCOCET/ROXICET) 5-325 MG tablet oxycodone-acetaminophen 5 mg-325 mg tablet  TAKE 1 TABLET BY MOUTH EVERY 6 HOURS AS NEEDED FOR PAIN    [provider]  promethazine (PHENERGAN) 25 MG suppository promethazine 25 mg rectal suppository   PLACE 1 SUPPOSITORY RECTALLY EVERY 6 HRS AS NEEDED FOR NAUSEA    [provider]  sertraline (ZOLOFT) 50 MG tablet sertraline 50 mg tablet  TAKE 1 TABLET BY MOUTH DAILY    [provider]  SUMAtriptan (IMITREX) 100 MG tablet sumatriptan 100 mg tablet  TAKE 1 TABLET AS NEEDED TAKE AS DIRECTED ORALLY 10 DAYS    [provider]  Topiramate (TOPAMAX PO) Topamax    [provider]  traMADol (ULTRAM) 50 MG tablet tramadol 50 mg tablet  TAKE 1 TABLET BY MOUTH EVERY 6HRS AS NEEDED    [provider]    Allergies Sulfa antibiotics  Family History  Problem Relation Age of Onset  . Liver cancer Father        LIVER  . Diabetes Unknown   . Hypertension Unknown   . Hypothyroidism Unknown   . Stroke Unknown   . Heart disease Unknown   . Bladder Cancer Neg Hx   . Prostate cancer Neg Hx   . Kidney cancer Neg Hx     Social History Social History  Substance Use Topics  . Smoking status: Never Smoker  . Smokeless tobacco: Never Used  . Alcohol use Yes     Comment: Social    Review of Systems Constitutional: No fever/chills Cardiovascular: Denies chest pain. Respiratory: Denies shortness of breath. Gastrointestinal:   No nausea, no vomiting.   Genitourinary: positive for dysuria, positive for odor with urination. Denies vaginal discharge. Musculoskeletal: Negative for back pain. Skin: Negative for rash. Neurological: Negative for headaches, focal weakness or numbness. ____________________________________________   PHYSICAL EXAM:  VITAL SIGNS: ED Triage Vitals  Enc Vitals Group     BP 08/29/17 1159 (!) 131/93     Pulse Rate 08/29/17 1159 75     Resp 08/29/17 1159 20     Temp 08/29/17 1159 98.1 F (36.7 C)     Temp Source 08/29/17 1159 Oral     SpO2 08/29/17 1159 95 %     Weight 08/29/17 1200 220 lb (99.8 kg)     Height --      Head Circumference --      Peak Flow --      Pain Score 08/29/17 1158 7     Pain Loc --      Pain Edu?  --      Excl. in GC? --    Constitutional: Alert and oriented. Well appearing and in no acute distress. Eyes: Conjunctivae are normal. PERRL. EOMI. Head: Atraumatic. Neck: No stridor.   Cardiovascular: Normal rate, regular rhythm. Grossly normal heart sounds.  Good peripheral circulation. Respiratory: Normal respiratory effort.  No retractions. Lungs CTAB. Gastrointestinal: Soft and nontender. No distention.  Genitourinary: white vaginal discharge is noted. There is minimal tenderness adnexal areas bilaterally but no masses noted. There is no cervical motion tenderness. Musculoskeletal: Ms. Patton Salles and lower extremities without any difficulty. Neurologic:  Normal speech and language.  No gross focal neurologic deficits are appreciated. No gait instability. Skin:  Skin is warm, dry and intact. No rash noted. Psychiatric: Mood and affect are normal. Speech and behavior are normal.  ____________________________________________   LABS (all labs ordered are listed, but only abnormal results are displayed)  Labs Reviewed  WET PREP, GENITAL - Abnormal; Notable for the following:       Result Value   Clue Cells Wet Prep HPF POC PRESENT (*)    WBC, Wet Prep HPF POC FEW (*)    All other components within normal limits  URINALYSIS, COMPLETE (UACMP) WITH MICROSCOPIC - Abnormal; Notable for the following:    Color, Urine YELLOW (*)    APPearance HAZY (*)    Bacteria, UA MANY (*)    Squamous Epithelial / LPF 0-5 (*)    All other components within normal limits     PROCEDURES  Procedure(s) performed: None  Procedures  Critical Care performed: No  ____________________________________________   INITIAL IMPRESSION / ASSESSMENT AND PLAN / ED COURSE    Patient was made aware that her urinalysis did not show any signs of infection but clue cells were seen on her wet prep. Patient was treated with Flagyl 500 mg twice a day for 7 days. Patient has taken this medication before and is aware  that she cannot drink alcohol with this medication. She is follow-up with her PCP at [facility] if any continued problems. ___________________________________________   FINAL CLINICAL IMPRESSION(S) / ED DIAGNOSES  Final diagnoses:  Bacterial vaginosis      NEW MEDICATIONS STARTED DURING THIS VISIT:  Discharge Medication List as of 08/29/2017  3:04 PM       Note:  This document was prepared using Dragon voice recognition software and may include unintentional dictation errors.    Tommi Rumps, PA-C 08/29/17 1723    Minna Antis, MD 09/01/17 1550

## 2017-11-24 ENCOUNTER — Emergency Department
Admission: EM | Admit: 2017-11-24 | Discharge: 2017-11-24 | Disposition: A | Discharge status: Home / Self Care | Payer: Self-pay | Attending: Emergency Medicine | Admitting: Emergency Medicine

## 2017-11-24 ENCOUNTER — Encounter: Payer: Self-pay | Admitting: Emergency Medicine

## 2017-11-24 ENCOUNTER — Other Ambulatory Visit: Payer: Self-pay

## 2017-11-24 DIAGNOSIS — G43909 Migraine, unspecified, not intractable, without status migrainosus: Secondary | ICD-10-CM | POA: Insufficient documentation

## 2017-11-24 DIAGNOSIS — Z79899 Other long term (current) drug therapy: Secondary | ICD-10-CM | POA: Insufficient documentation

## 2017-11-24 HISTORY — DX: Migraine, unspecified, not intractable, without status migrainosus: G43.909

## 2017-11-24 MED ORDER — BUTALBITAL-APAP-CAFFEINE 50-325-40 MG PO TABS: 1.0000 | ORAL_TABLET | Freq: Four times a day (QID) | ORAL | 0 refills | Status: AC | PRN

## 2017-11-24 MED ORDER — DIPHENHYDRAMINE HCL 50 MG/ML IJ SOLN
25.0000 mg | Freq: Once | INTRAMUSCULAR | Status: AC
  Administered 2017-11-24: 25 mg via INTRAVENOUS
  Filled 2017-11-24: qty 1

## 2017-11-24 MED ORDER — KETOROLAC TROMETHAMINE 30 MG/ML IJ SOLN
30.0000 mg | Freq: Once | INTRAMUSCULAR | Status: AC
  Administered 2017-11-24: 30 mg via INTRAVENOUS
  Filled 2017-11-24: qty 1

## 2017-11-24 MED ORDER — SODIUM CHLORIDE 0.9 % IV SOLN
1000.0000 mL | Freq: Once | INTRAVENOUS | Status: AC
  Administered 2017-11-24: 1000 mL via INTRAVENOUS

## 2017-11-24 MED ORDER — METOCLOPRAMIDE HCL 5 MG/ML IJ SOLN
20.0000 mg | Freq: Once | INTRAVENOUS | Status: AC
  Administered 2017-11-24: 20 mg via INTRAVENOUS
  Filled 2017-11-24: qty 4

## 2017-11-24 NOTE — ED Notes (Signed)
E-sign pad is not working. Discharge instructions and RX reviewed with patient. Pt verbalized understanding and denies any questions or concerns at this time.  Patient signed paper discharge document.

## 2017-11-24 NOTE — ED Triage Notes (Signed)
Arrives with C/O Migraine x 2 days.  Has history of migraines and pain is typical for migraine pain.  AAOx3.  Skin warm and dry. NAD

## 2017-11-24 NOTE — ED Provider Notes (Signed)
[facility] Emergency Department Provider Note   ____________________________________________    I have reviewed the triage vital signs and the nursing notes.   HISTORY  Chief Complaint Migraine     HPI Amber Nolan is a 37 y.o. female presents with complaints of a migraine.  Patient reports she has had throbbing global headache with photophobia over the last 2 days.  She is taken ibuprofen and aspirin without significant improvement.  She reports these are her typical migraine symptoms.  No neuro deficits.  No fevers.  No neck pain.  No nausea or vomiting.   Past Medical History:  Diagnosis Date  . Anxiety and depression   . Gastritis   . GERD (gastroesophageal reflux disease)   . Migraine   . Migraines   . Scoliosis   . Stomach ulcer     Patient Active Problem List   Diagnosis Date Noted  . Migraine with aura 01/10/2014    Past Surgical History:  Procedure Laterality Date  . APPENDECTOMY  1993  . CHOLECYSTECTOMY  2014  . EXPLORATORY LAPAROTOMY  2015  . RHINOPLASTY    . TUBAL LIGATION  2013    Prior to Admission medications   Medication Sig Start Date End Date Taking? Authorizing Provider  ALPRAZolam (XANAX) 0.25 MG tablet alprazolam 0.25 mg tablet  TAKE 1 TABLET BY MOUTH DAILY AS NEEDED    [provider]  busPIRone (BUSPAR) 10 MG tablet Take 10 mg by mouth 3 (three) times daily.    [provider]  butalbital-acetaminophen-caffeine (FIORICET, ESGIC) [id number] MG tablet Take 1-2 tablets by mouth every 6 (six) hours as needed for headache. 11/24/17 11/24/18  Jene EveryKinner, Kenna Seward, MD  citalopram (CELEXA) 40 MG tablet Take 40 mg by mouth daily.    [provider]  diclofenac (VOLTAREN) 75 MG EC tablet Take 75 mg by mouth 2 (two) times daily.    [provider]  docusate sodium (STOOL SOFTENER) 100 MG capsule Stool Softener 100 mg capsule  TAKE 1 CAPSULE TWICE A DAY    [provider]    fluconazole (DIFLUCAN) 150 MG tablet fluconazole 150 mg tablet  TAKE 1 TABLET NOW, MAY REPEAT IN 72 HOURS ORALLY 05 DAYS    [provider]  medroxyPROGESTERone (PROVERA) 10 MG tablet medroxyprogesterone 10 mg tablet  TAKE 1 TABLET BY MOUTH ONCE DAILY    [provider]  meloxicam (MOBIC) 15 MG tablet meloxicam 15 mg tablet  TAKE 1 TABLET BY MOUTH DAILY    [provider]  Methenamine-Sodium Salicylate (CYSTEX PO) Take by mouth.    [provider]  methocarbamol (ROBAXIN) 500 MG tablet methocarbamol 500 mg tablet  TAKE 1 TABLET BY MOUTH 4 TIMES A DAY    [provider]  methylPREDNISolone (MEDROL) 4 MG tablet methylprednisolone 4 mg tablets in a dose pack  TAKE BY MOUTH AS DIRECTED    [provider]  metroNIDAZOLE (FLAGYL) 500 MG tablet Take 1 tablet (500 mg total) by mouth 2 (two) times daily. 08/29/17   Tommi RumpsSummers, Rhonda L, PA-C  mirtazapine (REMERON) 30 MG tablet mirtazapine 30 mg tablet  TAKE 1 TABLET BY MOUTH AT BEDTIME    [provider]  nitrofurantoin, macrocrystal-monohydrate, (MACROBID) 100 MG capsule nitrofurantoin monohydrate/macrocrystals 100 mg capsule  TAKE 1 CAPSULE WITH FOOD ONCE A DAY ORALLY 30 DAYS    [provider]  ofloxacin (OCUFLOX) 0.3 % ophthalmic solution ofloxacin 0.3 % eye drops  INSTILL 1 DROP INTO RIGHT EYE FOUR  TIMES A DAY    [provider]  oxyCODONE-acetaminophen (PERCOCET/ROXICET) 5-325 MG tablet oxycodone-acetaminophen 5 mg-325 mg tablet  TAKE 1 TABLET BY MOUTH EVERY 6 HOURS AS NEEDED FOR PAIN    [provider]  promethazine (PHENERGAN) 25 MG suppository promethazine 25 mg rectal suppository  PLACE 1 SUPPOSITORY RECTALLY EVERY 6 HRS AS NEEDED FOR NAUSEA    [provider]  sertraline (ZOLOFT) 50 MG tablet sertraline 50 mg tablet  TAKE 1 TABLET BY MOUTH DAILY    [provider]  SUMAtriptan (IMITREX) 100 MG tablet sumatriptan 100 mg tablet  TAKE 1  TABLET AS NEEDED TAKE AS DIRECTED ORALLY 10 DAYS    [provider]  Topiramate (TOPAMAX PO) Topamax    [provider]  traMADol (ULTRAM) 50 MG tablet tramadol 50 mg tablet  TAKE 1 TABLET BY MOUTH EVERY 6HRS AS NEEDED    [provider]     Allergies Sulfa antibiotics  Family History  Problem Relation Age of Onset  . Liver cancer Father        LIVER  . Diabetes Unknown   . Hypertension Unknown   . Hypothyroidism Unknown   . Stroke Unknown   . Heart disease Unknown   . Bladder Cancer Neg Hx   . Prostate cancer Neg Hx   . Kidney cancer Neg Hx     Social History Social History   Tobacco Use  . Smoking status: Never Smoker  . Smokeless tobacco: Never Used  Substance Use Topics  . Alcohol use: Yes    Comment: Social  . Drug use: No    Review of Systems  Constitutional: No fever/chills Eyes: No visual changes.  Positive photophobia ENT: No sore throat.  Cardiovascular: Denies chest pain. Respiratory: Denies shortness of breath. Gastrointestinal:  No nausea, no vomiting.   Genitourinary: Negative for dysuria. Musculoskeletal: Negative for neck pain Skin: Negative for rash. Neurological: Negative for focal weakness   ____________________________________________   PHYSICAL EXAM:  VITAL SIGNS: ED Triage Vitals  Enc Vitals Group     BP 11/24/17 1158 (!) 158/77     Pulse Rate 11/24/17 1158 64     Resp 11/24/17 1158 14     Temp 11/24/17 1158 98.9 F (37.2 C)     Temp Source 11/24/17 1158 Oral     SpO2 11/24/17 1158 100 %     Weight 11/24/17 1159 99.8 kg (220 lb)     Height 11/24/17 1159 1.626 m (5\' 4" )     Head Circumference --      Peak Flow --      Pain Score 11/24/17 1209 6     Pain Loc --      Pain Edu? --      Excl. in GC? --     Constitutional: Alert and oriented. No acute distress.  Eyes: Conjunctivae are normal.  PERRLA, EOMI Head: Atraumatic. Nose: No congestion/rhinnorhea. Mouth/Throat: Mucous membranes are moist.    Neck:  Painless ROM Cardiovascular: Normal rate, regular rhythm.  Good peripheral circulation. Respiratory: Normal respiratory effort.  No retractions.   Musculoskeletal:  Warm and well perfused Neurologic:  Normal speech and language. No gross focal neurologic deficits are appreciated.  Skin:  Skin is warm, dry and intact. No rash noted. Psychiatric: Mood and affect are normal. Speech and behavior are normal.  ____________________________________________   LABS (all labs ordered are listed, but only abnormal results are displayed)  Labs Reviewed - No data to display ____________________________________________  EKG  None  ____________________________________________  RADIOLOGY  None ____________________________________________   PROCEDURES  Procedure(s) performed: No  Procedures   Critical Care performed: No ____________________________________________   INITIAL IMPRESSION / ASSESSMENT AND PLAN / ED COURSE  Pertinent labs & imaging results that were available during my care of the patient were reviewed by me and considered in my medical decision making (see chart for details).  Patient presents with symptoms consistent with her typical migraine.  Neuro exam is normal.  Otherwise well-appearing.  Will treat with Toradol, Reglan, Benadryl, IV fluids and reevaluate  After treatment patient reports her headache had improved significantly.  She is comfortable with discharge at this point home so she can rest.    ____________________________________________   FINAL CLINICAL IMPRESSION(S) / ED DIAGNOSES  Final diagnoses:  Migraine without status migrainosus, not intractable, unspecified migraine type        Note:  This document was prepared using Dragon voice recognition software and may include unintentional dictation errors.    Jene Every, MD 11/24/17 1539

## 2018-05-18 ENCOUNTER — Emergency Department
Admission: EM | Admit: 2018-05-18 | Discharge: 2018-05-18 | Disposition: A | Discharge status: Home / Self Care | Payer: Medicaid Other | Attending: Emergency Medicine | Admitting: Emergency Medicine

## 2018-05-18 ENCOUNTER — Other Ambulatory Visit: Payer: Self-pay

## 2018-05-18 ENCOUNTER — Encounter: Payer: Self-pay | Admitting: Emergency Medicine

## 2018-05-18 DIAGNOSIS — R300 Dysuria: Secondary | ICD-10-CM

## 2018-05-18 DIAGNOSIS — Z79899 Other long term (current) drug therapy: Secondary | ICD-10-CM | POA: Insufficient documentation

## 2018-05-18 DIAGNOSIS — M7918 Myalgia, other site: Secondary | ICD-10-CM

## 2018-05-18 LAB — URINALYSIS, COMPLETE (UACMP) WITH MICROSCOPIC
Bacteria, UA: NONE SEEN
Bilirubin Urine: NEGATIVE
Glucose, UA: NEGATIVE mg/dL
KETONES UR: NEGATIVE mg/dL
LEUKOCYTES UA: NEGATIVE
Nitrite: NEGATIVE
PH: 5 (ref 5.0–8.0)
Protein, ur: NEGATIVE mg/dL
SPECIFIC GRAVITY, URINE: 1.016 (ref 1.005–1.030)

## 2018-05-18 LAB — POC URINE PREG, ED: Preg Test, Ur: NEGATIVE

## 2018-05-18 MED ORDER — KETOROLAC TROMETHAMINE 10 MG PO TABS: 10.0000 mg | ORAL_TABLET | Freq: Three times a day (TID) | ORAL | 0 refills | Status: DC

## 2018-05-18 MED ORDER — ORPHENADRINE CITRATE 30 MG/ML IJ SOLN
60.0000 mg | INTRAMUSCULAR | Status: AC
  Administered 2018-05-18: 60 mg via INTRAMUSCULAR
  Filled 2018-05-18: qty 2

## 2018-05-18 MED ORDER — KETOROLAC TROMETHAMINE 30 MG/ML IJ SOLN
30.0000 mg | Freq: Once | INTRAMUSCULAR | Status: AC
  Administered 2018-05-18: 30 mg via INTRAMUSCULAR
  Filled 2018-05-18: qty 1

## 2018-05-18 MED ORDER — NABUMETONE 750 MG PO TABS: 750.0000 mg | ORAL_TABLET | Freq: Two times a day (BID) | ORAL | 0 refills | Status: DC

## 2018-05-18 MED ORDER — CYCLOBENZAPRINE HCL 5 MG PO TABS: 5.0000 mg | ORAL_TABLET | Freq: Three times a day (TID) | ORAL | 0 refills | Status: DC | PRN

## 2018-05-18 MED ORDER — CEPHALEXIN 500 MG PO CAPS: 500.0000 mg | ORAL_CAPSULE | Freq: Two times a day (BID) | ORAL | 0 refills | Status: AC

## 2018-05-18 NOTE — Discharge Instructions (Addendum)
Take the prescription meds as directed. Follow-up with your provider for ongoing symptoms.  °

## 2018-05-18 NOTE — ED Triage Notes (Signed)
Pt states back pain worsening with movement for the past few days, unrelated to her frequent uti's. Pain in mid to lower right back. States she is also here for antibiotic for UTI.

## 2018-05-18 NOTE — ED Notes (Signed)
Pt reports chronic back pain from scoliosis 3-4/10.  Today, after yardwork, pain is increased to 7/10 and not relieved with usual OTC (tylenol and ibuprofen.)  Pain starts in the middle lower back, radiates to right flank and travels down to R hip and buttock.

## 2018-05-20 NOTE — ED Provider Notes (Signed)
[facility] Emergency Department Provider Note ____________________________________________  Time seen: 84  I have reviewed the triage vital signs and the nursing notes.  HISTORY  Chief Complaint  Recurrent UTI  HPI Amber Nolan is a 37 y.o. female resents to the ED accompanied by her husband, for evaluation of back pain that has been persistent for the last few days, and worsened recently.  Patient describes the pain is different from the typical flank pain she experienced with her chronic UTIs.  She does however, feel that she has a another UTI.  She denies any recent injury, accident, or trauma.  She reports pain to the low back on the right side primarily with some referral to the buttocks.  She denies any bladder or bowel incontinence, saddle anesthesia, footdrop, or leg weakness.  She does give a history of some moderate scoliosis of the spine that she is been aware of since childhood.  She denies any ongoing or chronic back pain and takes no daily medications for pain and/or inflammation.  Past Medical History:  Diagnosis Date  . Anxiety and depression   . Gastritis   . GERD (gastroesophageal reflux disease)   . Migraine   . Migraines   . Scoliosis   . Stomach ulcer     Patient Active Problem List   Diagnosis Date Noted  . Migraine with aura 01/10/2014    Past Surgical History:  Procedure Laterality Date  . APPENDECTOMY  1993  . CHOLECYSTECTOMY  2014  . EXPLORATORY LAPAROTOMY  2015  . RHINOPLASTY    . TUBAL LIGATION  2013    Prior to Admission medications   Medication Sig Start Date End Date Taking? Authorizing Provider  ALPRAZolam (XANAX) 0.25 MG tablet alprazolam 0.25 mg tablet  TAKE 1 TABLET BY MOUTH DAILY AS NEEDED    [provider]  busPIRone (BUSPAR) 10 MG tablet Take 10 mg by mouth 3 (three) times daily.    [provider]  butalbital-acetaminophen-caffeine (FIORICET, ESGIC) [phone] MG tablet Take 1-2  tablets by mouth every 6 (six) hours as needed for headache. 11/24/17 11/24/18  Jene Every, MD  cephALEXin (KEFLEX) 500 MG capsule Take 1 capsule (500 mg total) by mouth 2 (two) times daily for 7 days. 05/18/18 05/25/18  Zaira Iacovelli, Charlesetta Ivory, PA-C  citalopram (CELEXA) 40 MG tablet Take 40 mg by mouth daily.    [provider]  cyclobenzaprine (FLEXERIL) 5 MG tablet Take 1 tablet (5 mg total) by mouth 3 (three) times daily as needed for muscle spasms. 05/18/18   Marcena Dias, Charlesetta Ivory, PA-C  diclofenac (VOLTAREN) 75 MG EC tablet Take 75 mg by mouth 2 (two) times daily.    [provider]  docusate sodium (STOOL SOFTENER) 100 MG capsule Stool Softener 100 mg capsule  TAKE 1 CAPSULE TWICE A DAY    [provider]  fluconazole (DIFLUCAN) 150 MG tablet fluconazole 150 mg tablet  TAKE 1 TABLET NOW, MAY REPEAT IN 72 HOURS ORALLY 05 DAYS    [provider]  ketorolac (TORADOL) 10 MG tablet Take 1 tablet (10 mg total) by mouth every 8 (eight) hours. 05/18/18   Shanaya Schneck, Charlesetta Ivory, PA-C  medroxyPROGESTERone (PROVERA) 10 MG tablet medroxyprogesterone 10 mg tablet  TAKE 1 TABLET BY MOUTH ONCE DAILY    [provider]  meloxicam (MOBIC) 15 MG tablet meloxicam 15 mg tablet  TAKE 1 TABLET BY MOUTH DAILY    [provider]  Methenamine-Sodium Salicylate (CYSTEX PO) Take by  mouth.    [provider]  methocarbamol (ROBAXIN) 500 MG tablet methocarbamol 500 mg tablet  TAKE 1 TABLET BY MOUTH 4 TIMES A DAY    [provider]  methylPREDNISolone (MEDROL) 4 MG tablet methylprednisolone 4 mg tablets in a dose pack  TAKE BY MOUTH AS DIRECTED    [provider]  metroNIDAZOLE (FLAGYL) 500 MG tablet Take 1 tablet (500 mg total) by mouth 2 (two) times daily. 08/29/17   Tommi Rumps, PA-C  mirtazapine (REMERON) 30 MG tablet mirtazapine 30 mg tablet  TAKE 1 TABLET BY MOUTH AT BEDTIME    [provider]  nabumetone (RELAFEN) 750  MG tablet Take 1 tablet (750 mg total) by mouth 2 (two) times daily. 05/18/18   Lebert Lovern, Charlesetta Ivory, PA-C  nitrofurantoin, macrocrystal-monohydrate, (MACROBID) 100 MG capsule nitrofurantoin monohydrate/macrocrystals 100 mg capsule  TAKE 1 CAPSULE WITH FOOD ONCE A DAY ORALLY 30 DAYS    [provider]  ofloxacin (OCUFLOX) 0.3 % ophthalmic solution ofloxacin 0.3 % eye drops  INSTILL 1 DROP INTO RIGHT EYE FOUR TIMES A DAY    [provider]  oxyCODONE-acetaminophen (PERCOCET/ROXICET) 5-325 MG tablet oxycodone-acetaminophen 5 mg-325 mg tablet  TAKE 1 TABLET BY MOUTH EVERY 6 HOURS AS NEEDED FOR PAIN    [provider]  promethazine (PHENERGAN) 25 MG suppository promethazine 25 mg rectal suppository  PLACE 1 SUPPOSITORY RECTALLY EVERY 6 HRS AS NEEDED FOR NAUSEA    [provider]  sertraline (ZOLOFT) 50 MG tablet sertraline 50 mg tablet  TAKE 1 TABLET BY MOUTH DAILY    [provider]  SUMAtriptan (IMITREX) 100 MG tablet sumatriptan 100 mg tablet  TAKE 1 TABLET AS NEEDED TAKE AS DIRECTED ORALLY 10 DAYS    [provider]  Topiramate (TOPAMAX PO) Topamax    [provider]  traMADol (ULTRAM) 50 MG tablet tramadol 50 mg tablet  TAKE 1 TABLET BY MOUTH EVERY 6HRS AS NEEDED    [provider]    Allergies Sulfa antibiotics  Family History  Problem Relation Age of Onset  . Liver cancer Father        LIVER  . Diabetes Unknown   . Hypertension Unknown   . Hypothyroidism Unknown   . Stroke Unknown   . Heart disease Unknown   . Bladder Cancer Neg Hx   . Prostate cancer Neg Hx   . Kidney cancer Neg Hx     Social History Social History   Tobacco Use  . Smoking status: Never Smoker  . Smokeless tobacco: Never Used  Substance Use Topics  . Alcohol use: Yes    Comment: Social  . Drug use: No    Review of Systems  Constitutional: Negative for fever. Cardiovascular: Negative for chest pain. Respiratory: Negative for  shortness of breath. Gastrointestinal: Negative for abdominal pain, vomiting and diarrhea. Genitourinary: Significant for dysuria. Musculoskeletal: Positive for back pain. Skin: Negative for rash. Neurological: Negative for headaches, focal weakness or numbness. ____________________________________________  PHYSICAL EXAM:  VITAL SIGNS: ED Triage Vitals  Enc Vitals Group     BP 05/18/18 1921 (!) 147/104     Pulse Rate 05/18/18 1921 77     Resp 05/18/18 1921 18     Temp 05/18/18 1921 99.1 F (37.3 C)     Temp Source 05/18/18 1921 Oral     SpO2 05/18/18 1921 99 %     Weight 05/18/18 1922 220 lb (99.8 kg)     Height 05/18/18 1922 5\' 4"  (1.626  m)     Head Circumference --      Peak Flow --      Pain Score 05/18/18 1936 7     Pain Loc --      Pain Edu? --      Excl. in GC? --     Constitutional: Alert and oriented. Well appearing and in no distress. Head: Normocephalic and atraumatic. Eyes: Conjunctivae are normal. PERRL. Normal extraocular movements Ears: Canals clear. TMs intact bilaterally. Nose: No congestion/rhinorrhea/epistaxis. Mouth/Throat: Mucous membranes are moist. Neck: Supple. No thyromegaly. Hematological/Lymphatic/Immunological: No cervical lymphadenopathy. Cardiovascular: Normal rate, regular rhythm. Normal distal pulses. Respiratory: Normal respiratory effort. No wheezes/rales/rhonchi. Gastrointestinal: Soft and nontender. No distention. Musculoskeletal: Nontender with normal range of motion in all extremities.  Neurologic:  Normal gait without ataxia. Normal speech and language. No gross focal neurologic deficits are appreciated. Skin:  Skin is warm, dry and intact. No rash noted. Psychiatric: Mood and affect are normal. Patient exhibits appropriate insight and judgment. ____________________________________________   LABS (pertinent positives/negatives)  Labs Reviewed  URINALYSIS, COMPLETE (UACMP) WITH MICROSCOPIC - Abnormal; Notable for the following  components:      Result Value   Color, Urine YELLOW (*)    APPearance CLEAR (*)    Hgb urine dipstick SMALL (*)    All other components within normal limits  POC URINE PREG, ED  ____________________________________________  PROCEDURES  Procedures  Ketorolac 30 mg IM Norflex 60 mg IM ____________________________________________  INITIAL IMPRESSION / ASSESSMENT AND PLAN / ED COURSE  Patient with ED evaluation of right-sided low back pain as well as evaluation for chronic UTI.  Patient's exam is overall benign.  There is no indication of any acute neuromuscular deficit.  Symptoms likely represent musculoskeletal pain related to her underlying dextroscoliosis.  Her urinalysis does not reveal any significant white blood cell count, patient will be discharged with a small prescription to dose as directed.  She will follow with primary provider related to her lumbar strain.  She is discharged with prescriptions for Keflex, Flexeril, Toradol, and Relafen. ____________________________________________  FINAL CLINICAL IMPRESSION(S) / ED DIAGNOSES  Final diagnoses:  Dysuria  Lumbar muscle pain      Raniya Golembeski, Charlesetta IvoryJenise V Bacon, PA-C 05/20/18 1844    Phineas SemenGoodman, [place], MD 05/21/18 1610

## 2018-11-17 ENCOUNTER — Emergency Department
Admission: EM | Admit: 2018-11-17 | Discharge: 2018-11-17 | Disposition: A | Discharge status: Home / Self Care | Payer: Medicaid Other | Attending: Emergency Medicine | Admitting: Emergency Medicine

## 2018-11-17 ENCOUNTER — Other Ambulatory Visit: Payer: Self-pay

## 2018-11-17 ENCOUNTER — Encounter: Payer: Self-pay | Admitting: Emergency Medicine

## 2018-11-17 DIAGNOSIS — M419 Scoliosis, unspecified: Secondary | ICD-10-CM | POA: Insufficient documentation

## 2018-11-17 DIAGNOSIS — Z79899 Other long term (current) drug therapy: Secondary | ICD-10-CM | POA: Diagnosis not present

## 2018-11-17 DIAGNOSIS — F329 Major depressive disorder, single episode, unspecified: Secondary | ICD-10-CM | POA: Diagnosis not present

## 2018-11-17 DIAGNOSIS — F419 Anxiety disorder, unspecified: Secondary | ICD-10-CM | POA: Diagnosis not present

## 2018-11-17 DIAGNOSIS — R55 Syncope and collapse: Secondary | ICD-10-CM | POA: Insufficient documentation

## 2018-11-17 DIAGNOSIS — Z9049 Acquired absence of other specified parts of digestive tract: Secondary | ICD-10-CM | POA: Diagnosis not present

## 2018-11-17 LAB — URINALYSIS, COMPLETE (UACMP) WITH MICROSCOPIC
BILIRUBIN URINE: NEGATIVE
Glucose, UA: NEGATIVE mg/dL
Ketones, ur: NEGATIVE mg/dL
LEUKOCYTES UA: NEGATIVE
Nitrite: NEGATIVE
PROTEIN: NEGATIVE mg/dL
SPECIFIC GRAVITY, URINE: 1.008 (ref 1.005–1.030)
pH: 7 (ref 5.0–8.0)

## 2018-11-17 LAB — POCT PREGNANCY, URINE: PREG TEST UR: NEGATIVE

## 2018-11-17 LAB — BASIC METABOLIC PANEL
Anion gap: 9 (ref 5–15)
BUN: 11 mg/dL (ref 6–20)
CALCIUM: 8.5 mg/dL — AB (ref 8.9–10.3)
CO2: 22 mmol/L (ref 22–32)
CREATININE: 0.63 mg/dL (ref 0.44–1.00)
Chloride: 105 mmol/L (ref 98–111)
GFR calc Af Amer: >60 mL/min (ref >60)
Glucose, Bld: 122 mg/dL — ABNORMAL HIGH (ref 70–99)
POTASSIUM: 3.1 mmol/L — AB (ref 3.5–5.1)
SODIUM: 136 mmol/L (ref 135–145)

## 2018-11-17 LAB — CBC
HCT: 40.9 % (ref 36.0–46.0)
Hemoglobin: 13.2 g/dL (ref 12.0–15.0)
MCH: 29.9 pg (ref 26.0–34.0)
MCHC: 32.3 g/dL (ref 30.0–36.0)
MCV: 92.5 fL (ref 80.0–100.0)
Platelets: 394 10*3/uL (ref 150–400)
RBC: 4.42 MIL/uL (ref 3.87–5.11)
RDW: 12.7 % (ref 11.5–15.5)
WBC: 7.8 10*3/uL (ref 4.0–10.5)
nRBC: 0 % (ref 0.0–0.2)

## 2018-11-17 NOTE — ED Provider Notes (Signed)
[facility] Emergency Department Provider Note  ____________________________________________  Time seen: Approximately 1:06 PM  I have reviewed the triage vital signs and the nursing notes.   HISTORY  Chief Complaint Near Syncope    HPI Amber Nolan is a 38 y.o. female with a history of anxiety depression GERD gastritis and vagal episodes who comes to the ED today after an episode of dizziness and nearly passing out.  She reports she was in her usual state of health, asymptomatic, had just brushed her teeth when she suddenly felt lightheaded and her vision narrowing and darkening.  She felt nauseated and flushed.  She sat down on the bed to avoid falling, and after a few minutes the sensation passed.  She now just feels tired.  She denies any chest pain shortness of breath headache vision changes paresthesias weakness or palpitations.  She has had episodes like this in the past.      Past Medical History:  Diagnosis Date  . Anxiety and depression   . Gastritis   . GERD (gastroesophageal reflux disease)   . Migraine   . Migraines   . Scoliosis   . Stomach ulcer      Patient Active Problem List   Diagnosis Date Noted  . Migraine with aura 01/10/2014     Past Surgical History:  Procedure Laterality Date  . APPENDECTOMY  1993  . CHOLECYSTECTOMY  2014  . EXPLORATORY LAPAROTOMY  2015  . RHINOPLASTY    . TUBAL LIGATION  2013     Prior to Admission medications   Medication Sig Start Date End Date Taking? Authorizing Provider  ALPRAZolam (XANAX) 0.25 MG tablet alprazolam 0.25 mg tablet  TAKE 1 TABLET BY MOUTH DAILY AS NEEDED    [provider]  busPIRone (BUSPAR) 10 MG tablet Take 10 mg by mouth 3 (three) times daily.    [provider]  butalbital-acetaminophen-caffeine (FIORICET, ESGIC) [phone] MG tablet Take 1-2 tablets by mouth every 6 (six) hours as needed for headache. 11/24/17 11/24/18  Jene EveryKinner, Robert, MD   citalopram (CELEXA) 40 MG tablet Take 40 mg by mouth daily.    [provider]  cyclobenzaprine (FLEXERIL) 5 MG tablet Take 1 tablet (5 mg total) by mouth 3 (three) times daily as needed for muscle spasms. 05/18/18   Menshew, Charlesetta IvoryJenise V Bacon, PA-C  diclofenac (VOLTAREN) 75 MG EC tablet Take 75 mg by mouth 2 (two) times daily.    [provider]  docusate sodium (STOOL SOFTENER) 100 MG capsule Stool Softener 100 mg capsule  TAKE 1 CAPSULE TWICE A DAY    [provider]  fluconazole (DIFLUCAN) 150 MG tablet fluconazole 150 mg tablet  TAKE 1 TABLET NOW, MAY REPEAT IN 72 HOURS ORALLY 05 DAYS    [provider]  ketorolac (TORADOL) 10 MG tablet Take 1 tablet (10 mg total) by mouth every 8 (eight) hours. 05/18/18   Menshew, Charlesetta IvoryJenise V Bacon, PA-C  medroxyPROGESTERone (PROVERA) 10 MG tablet medroxyprogesterone 10 mg tablet  TAKE 1 TABLET BY MOUTH ONCE DAILY    [provider]  meloxicam (MOBIC) 15 MG tablet meloxicam 15 mg tablet  TAKE 1 TABLET BY MOUTH DAILY    [provider]  Methenamine-Sodium Salicylate (CYSTEX PO) Take by mouth.    [provider]  methocarbamol (ROBAXIN) 500 MG tablet methocarbamol 500 mg tablet  TAKE 1 TABLET BY MOUTH 4 TIMES A DAY    [provider]  methylPREDNISolone (MEDROL) 4 MG tablet methylprednisolone 4 mg  tablets in a dose pack  TAKE BY MOUTH AS DIRECTED    [provider]  metroNIDAZOLE (FLAGYL) 500 MG tablet Take 1 tablet (500 mg total) by mouth 2 (two) times daily. 08/29/17   Tommi RumpsSummers, Rhonda L, PA-C  mirtazapine (REMERON) 30 MG tablet mirtazapine 30 mg tablet  TAKE 1 TABLET BY MOUTH AT BEDTIME    [provider]  nabumetone (RELAFEN) 750 MG tablet Take 1 tablet (750 mg total) by mouth 2 (two) times daily. 05/18/18   Menshew, Charlesetta IvoryJenise V Bacon, PA-C  nitrofurantoin, macrocrystal-monohydrate, (MACROBID) 100 MG capsule nitrofurantoin monohydrate/macrocrystals 100 mg capsule  TAKE 1 CAPSULE  WITH FOOD ONCE A DAY ORALLY 30 DAYS    [provider]  ofloxacin (OCUFLOX) 0.3 % ophthalmic solution ofloxacin 0.3 % eye drops  INSTILL 1 DROP INTO RIGHT EYE FOUR TIMES A DAY    [provider]  oxyCODONE-acetaminophen (PERCOCET/ROXICET) 5-325 MG tablet oxycodone-acetaminophen 5 mg-325 mg tablet  TAKE 1 TABLET BY MOUTH EVERY 6 HOURS AS NEEDED FOR PAIN    [provider]  promethazine (PHENERGAN) 25 MG suppository promethazine 25 mg rectal suppository  PLACE 1 SUPPOSITORY RECTALLY EVERY 6 HRS AS NEEDED FOR NAUSEA    [provider]  sertraline (ZOLOFT) 50 MG tablet sertraline 50 mg tablet  TAKE 1 TABLET BY MOUTH DAILY    [provider]  SUMAtriptan (IMITREX) 100 MG tablet sumatriptan 100 mg tablet  TAKE 1 TABLET AS NEEDED TAKE AS DIRECTED ORALLY 10 DAYS    [provider]  Topiramate (TOPAMAX PO) Topamax    [provider]  traMADol (ULTRAM) 50 MG tablet tramadol 50 mg tablet  TAKE 1 TABLET BY MOUTH EVERY 6HRS AS NEEDED    [provider]     Allergies Sulfa antibiotics   Family History  Problem Relation Age of Onset  . Liver cancer Father        LIVER  . Diabetes Other   . Hypertension Other   . Hypothyroidism Other   . Stroke Other   . Heart disease Other   . Bladder Cancer Neg Hx   . Prostate cancer Neg Hx   . Kidney cancer Neg Hx     Social History Social History   Tobacco Use  . Smoking status: Never Smoker  . Smokeless tobacco: Never Used  Substance Use Topics  . Alcohol use: Yes    Comment: Social  . Drug use: No    Review of Systems  Constitutional:   No fever or chills.  ENT:   No sore throat. No rhinorrhea. Cardiovascular:   No chest pain or syncope. Respiratory:   No dyspnea or cough. Gastrointestinal:   Negative for abdominal pain, vomiting and diarrhea.  Musculoskeletal:   Negative for focal pain or swelling All other systems reviewed and are negative except as documented above  in ROS and HPI.  ____________________________________________   PHYSICAL EXAM:  VITAL SIGNS: ED Triage Vitals  Enc Vitals Group     BP 11/17/18 0854 (!) 155/95     Pulse Rate 11/17/18 0854 95     Resp 11/17/18 0854 18     Temp 11/17/18 0854 97.9 F (36.6 C)     Temp Source 11/17/18 0854 Oral     SpO2 11/17/18 0854 96 %     Weight 11/17/18 0855 220 lb (99.8 kg)     Height 11/17/18 0855 5\' 4"  (1.626 m)     Head Circumference --      Peak Flow --  Pain Score 11/17/18 0914 0     Pain Loc --      Pain Edu? --      Excl. in GC? --     Vital signs reviewed, nursing assessments reviewed.   Constitutional:   Alert and oriented. Non-toxic appearance. Eyes:   Conjunctivae are normal. EOMI. PERRL. ENT      Head:   Normocephalic and atraumatic.      Nose:   No congestion/rhinnorhea.       Mouth/Throat:   MMM, no pharyngeal erythema. No peritonsillar mass.       Neck:   No meningismus. Full ROM.  Thyroid nonpalpable Hematological/Lymphatic/Immunilogical:   No cervical lymphadenopathy. Cardiovascular:   RRR. Symmetric bilateral radial and DP pulses.  No murmurs. Cap refill less than 2 seconds. Respiratory:   Normal respiratory effort without tachypnea/retractions. Breath sounds are clear and equal bilaterally. No wheezes/rales/rhonchi. Gastrointestinal:   Soft and nontender. Non distended. There is no CVA tenderness.  No rebound, rigidity, or guarding. Musculoskeletal:   Normal range of motion in all extremities. No joint effusions.  No lower extremity tenderness.  No edema. Neurologic:   Normal speech and language.  Motor grossly intact. No acute focal neurologic deficits are appreciated.  Skin:    Skin is warm, dry and intact. No rash noted.  No petechiae, purpura, or bullae.  ____________________________________________    LABS (pertinent positives/negatives) (all labs ordered are listed, but only abnormal results are displayed) Labs Reviewed  BASIC METABOLIC PANEL -  Abnormal; Notable for the following components:      Result Value   Potassium 3.1 (*)    Glucose, Bld 122 (*)    Calcium 8.5 (*)    All other components within normal limits  URINALYSIS, COMPLETE (UACMP) WITH MICROSCOPIC - Abnormal; Notable for the following components:   Color, Urine YELLOW (*)    APPearance CLEAR (*)    Hgb urine dipstick SMALL (*)    Bacteria, UA RARE (*)    All other components within normal limits  CBC  CBG MONITORING, ED  POC URINE PREG, ED  POCT PREGNANCY, URINE   ____________________________________________   EKG  Interpreted by me Normal sinus rhythm rate of 93, normal axis and intervals.  Poor R wave progression.  Normal ST segments and T waves.  No acute ischemic changes.  Not significantly changed from June 2015.  ____________________________________________    RADIOLOGY  No results found.  ____________________________________________   PROCEDURES Procedures  ____________________________________________    CLINICAL IMPRESSION / ASSESSMENT AND PLAN / ED COURSE  Pertinent labs & imaging results that were available during my care of the patient were reviewed by me and considered in my medical decision making (see chart for details).    Patient presents with episode of dizziness and near syncope without prodromal symptoms.  Occurred after brushing teeth.  Consistent with previous vagal episode she has had in the past, no other worrisome symptoms.Considering the patient's symptoms, medical history, and physical examination today, I have low suspicion for ischemic stroke, intracranial hemorrhage, meningitis, encephalitis, carotid or vertebral dissection, venous sinus thrombosis, MS, intracranial hypertension, glaucoma, CRAO, CRVO, or temporal arteritis. Doubt ACS PE dissection or dysrhythmia.  Recommend follow-up with cardiology.  Work note provided.      ____________________________________________   FINAL CLINICAL IMPRESSION(S) / ED  DIAGNOSES    Final diagnoses:  Vaso vagal episode  Near syncope     ED Discharge Orders    None      Portions of this note  were generated with dragon dictation software. Dictation errors may occur despite best attempts at proofreading.   Sharman Cheek, MD 11/17/18 1309

## 2018-11-17 NOTE — ED Triage Notes (Signed)
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

## 2018-11-17 NOTE — Discharge Instructions (Signed)
Your tests today were okay. Your symptoms appear to be due to a vagal episode.  However, if you have persistent symptoms, you should follow up with cardiology for further evaluation.

## 2018-11-17 NOTE — ED Notes (Signed)
POC urine pregnancy test NEGATIVE 

## 2018-11-17 NOTE — ED Triage Notes (Signed)
Patient states she had a near syncopal episode this AM after brushing her teeth.  States she feels shaky with chest pain and HA.  Denies illness prior to this AM.  Has had similar episodes in the past.

## 2018-11-17 NOTE — ED Notes (Signed)
Urine specimen collected.  Patient in [place] with husband, alert and oriented, ambulatory without complaints.

## 2018-11-17 NOTE — ED Triage Notes (Signed)
Patient denies pain at this time, states she feels weak and shaky and "I just want to lay down".

## 2019-04-28 IMAGING — CR DG ABDOMEN 1V
2 series · 2 of 2 positions shown · non-contrast
Comparison: CT the abdomen and pelvis 06/01/2016. Abdominal x-ray
is 10/09/2015.

CLINICAL DATA: Nephrolithiasis.

EXAM:
ABDOMEN - 1 VIEW

[abdomen kub (1 of 2)]
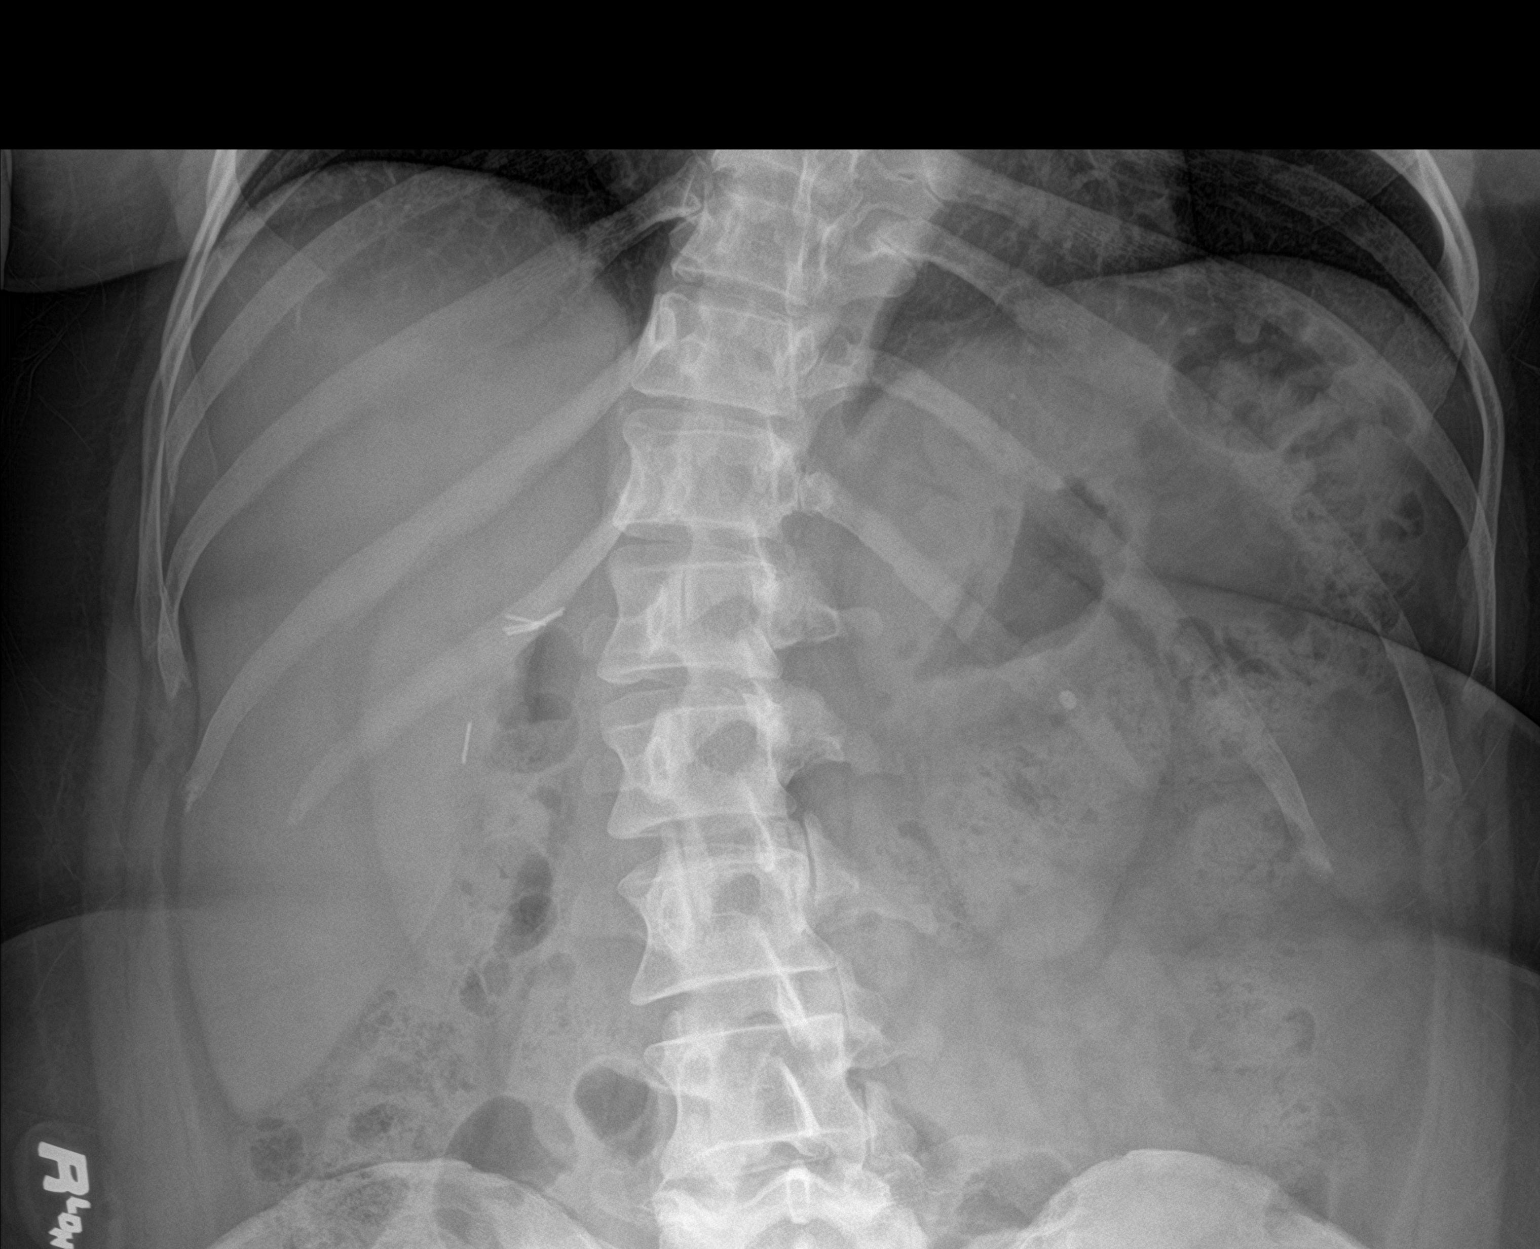

[abdomen kub (2 of 2)]
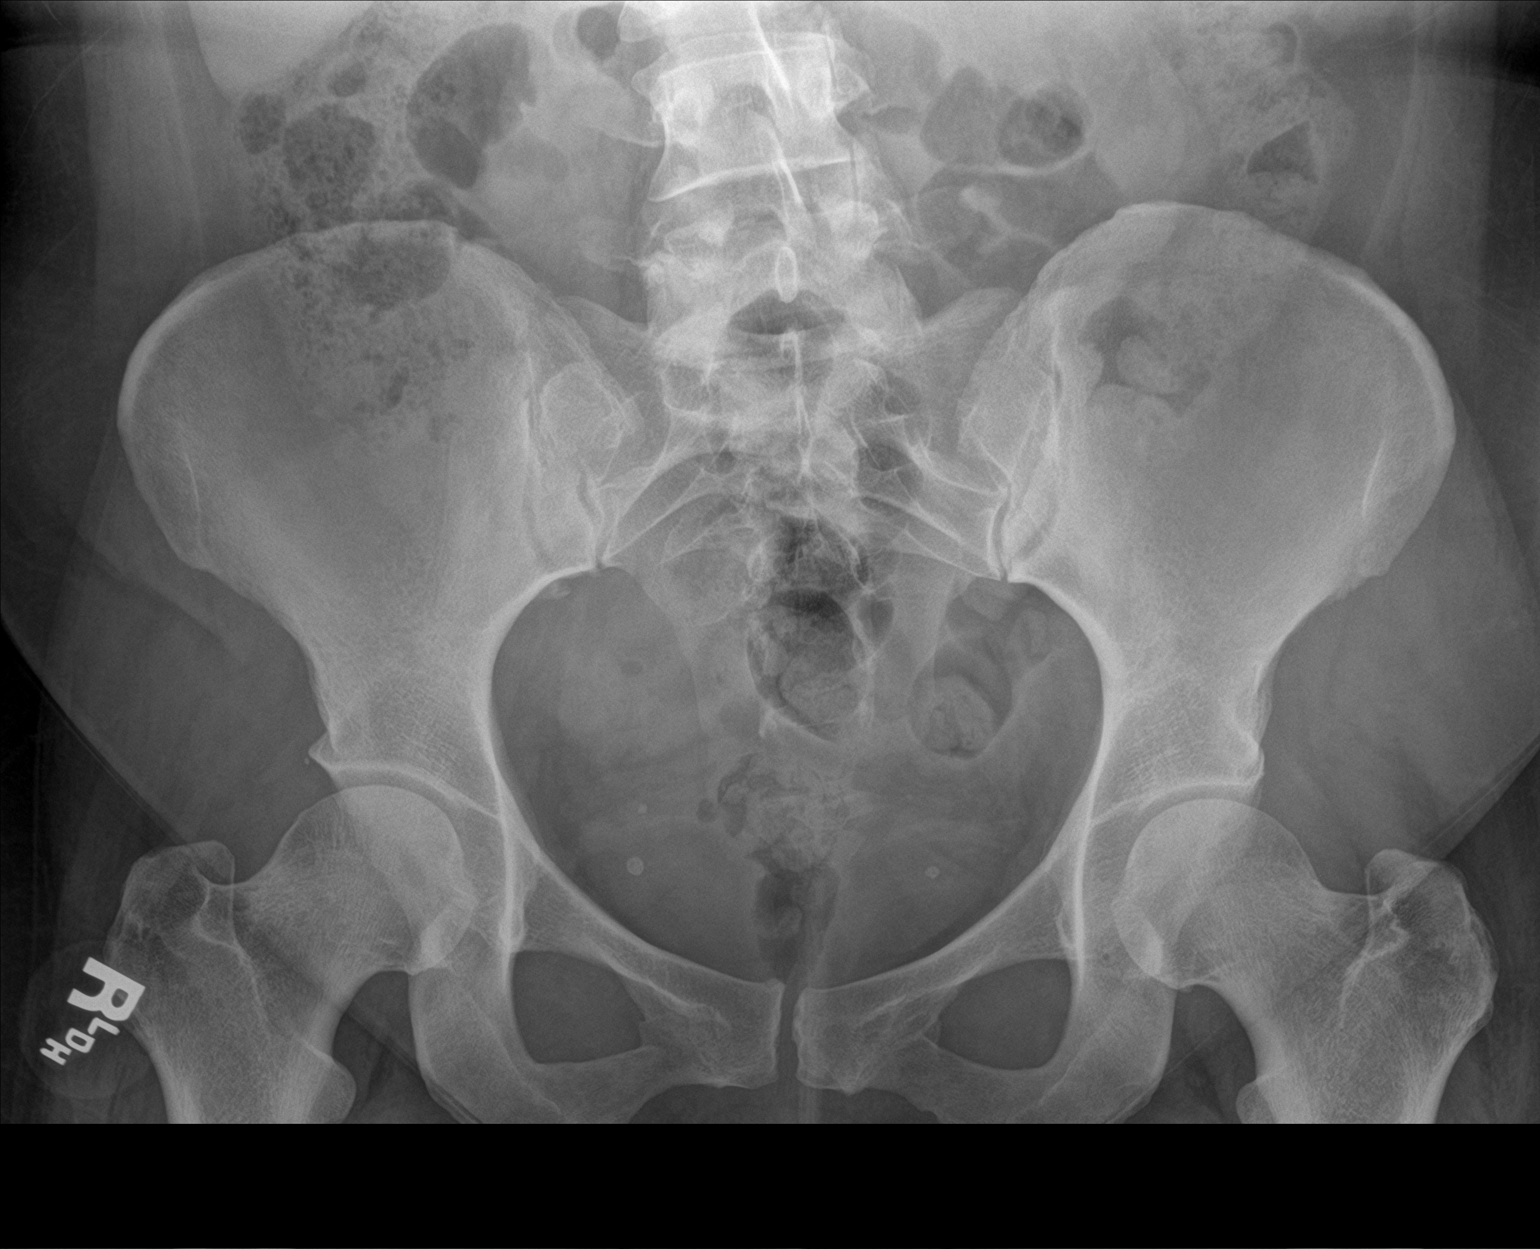

[2 of 2 positions shown; findings below may reference images not displayed]

FINDINGS: A 5 mm nonobstructing stone in the left kidney is stable in size and
position. No new stones are present. Surgical clips are present at
the gallbladder fossa. The bowel gas pattern is normal. Hepatomegaly
is stable. Rightward curvature of the lumbar spine is again noted.
IMPRESSION: 1. Stable nonobstructing stone in the left kidney.

## 2019-05-03 ENCOUNTER — Other Ambulatory Visit: Payer: Self-pay

## 2019-05-03 ENCOUNTER — Encounter: Payer: Self-pay | Admitting: Emergency Medicine

## 2019-05-03 DIAGNOSIS — R112 Nausea with vomiting, unspecified: Secondary | ICD-10-CM | POA: Insufficient documentation

## 2019-05-03 DIAGNOSIS — R531 Weakness: Secondary | ICD-10-CM | POA: Insufficient documentation

## 2019-05-03 DIAGNOSIS — R55 Syncope and collapse: Secondary | ICD-10-CM | POA: Diagnosis not present

## 2019-05-03 DIAGNOSIS — E860 Dehydration: Secondary | ICD-10-CM | POA: Insufficient documentation

## 2019-05-03 DIAGNOSIS — G43809 Other migraine, not intractable, without status migrainosus: Secondary | ICD-10-CM | POA: Diagnosis not present

## 2019-05-03 DIAGNOSIS — R0602 Shortness of breath: Secondary | ICD-10-CM | POA: Insufficient documentation

## 2019-05-03 DIAGNOSIS — Z79899 Other long term (current) drug therapy: Secondary | ICD-10-CM | POA: Insufficient documentation

## 2019-05-03 DIAGNOSIS — R51 Headache: Secondary | ICD-10-CM | POA: Diagnosis present

## 2019-05-03 LAB — TROPONIN I: Troponin I: <0.03 ng/mL (ref <0.03)

## 2019-05-03 LAB — BASIC METABOLIC PANEL
Anion gap: 14 (ref 5–15)
BUN: 12 mg/dL (ref 6–20)
CO2: 25 mmol/L (ref 22–32)
Calcium: 8.6 mg/dL — ABNORMAL LOW (ref 8.9–10.3)
Chloride: 99 mmol/L (ref 98–111)
Creatinine, Ser: 0.67 mg/dL (ref 0.44–1.00)
GFR calc Af Amer: >60 mL/min (ref >60)
GFR calc non Af Amer: >60 mL/min (ref >60)
Glucose, Bld: 174 mg/dL — ABNORMAL HIGH (ref 70–99)
Potassium: 3.3 mmol/L — ABNORMAL LOW (ref 3.5–5.1)
Sodium: 138 mmol/L (ref 135–145)

## 2019-05-03 LAB — CBC
HCT: 39.6 % (ref 36.0–46.0)
Hemoglobin: 13.2 g/dL (ref 12.0–15.0)
MCH: 30.8 pg (ref 26.0–34.0)
MCHC: 33.3 g/dL (ref 30.0–36.0)
MCV: 92.5 fL (ref 80.0–100.0)
Platelets: 399 10*3/uL (ref 150–400)
RBC: 4.28 MIL/uL (ref 3.87–5.11)
RDW: 12.6 % (ref 11.5–15.5)
WBC: 19.5 10*3/uL — ABNORMAL HIGH (ref 4.0–10.5)
nRBC: 0 % (ref 0.0–0.2)

## 2019-05-03 MED ORDER — ONDANSETRON 4 MG PO TBDP
4.0000 mg | ORAL_TABLET | Freq: Once | ORAL | Status: AC | PRN
  Administered 2019-05-03: 4 mg via ORAL
  Filled 2019-05-03: qty 1
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
## 2019-05-04 ENCOUNTER — Emergency Department: Payer: Medicaid Other

## 2019-05-04 ENCOUNTER — Emergency Department
Admission: EM | Admit: 2019-05-04 | Discharge: 2019-05-04 | Disposition: A | Discharge status: Home / Self Care | Payer: Medicaid Other | Attending: Emergency Medicine | Admitting: Emergency Medicine

## 2019-05-04 DIAGNOSIS — G43809 Other migraine, not intractable, without status migrainosus: Secondary | ICD-10-CM

## 2019-05-04 DIAGNOSIS — E860 Dehydration: Secondary | ICD-10-CM

## 2019-05-04 DIAGNOSIS — R531 Weakness: Secondary | ICD-10-CM

## 2019-05-04 LAB — URINALYSIS, COMPLETE (UACMP) WITH MICROSCOPIC
Bilirubin Urine: NEGATIVE
Glucose, UA: NEGATIVE mg/dL
Hgb urine dipstick: NEGATIVE
Ketones, ur: 20 mg/dL — AB
Leukocytes,Ua: NEGATIVE
Nitrite: NEGATIVE
Protein, ur: 100 mg/dL — AB
Specific Gravity, Urine: 1.024 (ref 1.005–1.030)
pH: 7 (ref 5.0–8.0)

## 2019-05-04 LAB — CK: Total CK: 81 U/L (ref 38–234)

## 2019-05-04 MED ORDER — PROCHLORPERAZINE EDISYLATE 10 MG/2ML IJ SOLN
5.0000 mg | Freq: Once | INTRAMUSCULAR | Status: AC
  Administered 2019-05-04: 5 mg via INTRAVENOUS
  Filled 2019-05-04: qty 2

## 2019-05-04 MED ORDER — DIPHENHYDRAMINE HCL 50 MG/ML IJ SOLN
25.0000 mg | Freq: Once | INTRAMUSCULAR | Status: AC
  Administered 2019-05-04: 25 mg via INTRAVENOUS
  Filled 2019-05-04: qty 1

## 2019-05-04 MED ORDER — PROCHLORPERAZINE MALEATE 10 MG PO TABS: 10.0000 mg | ORAL_TABLET | Freq: Four times a day (QID) | ORAL | 0 refills | Status: DC | PRN

## 2019-05-04 MED ORDER — KETOROLAC TROMETHAMINE 30 MG/ML IJ SOLN
10.0000 mg | Freq: Once | INTRAMUSCULAR | Status: AC
  Administered 2019-05-04: 9.9 mg via INTRAVENOUS
  Filled 2019-05-04: qty 1

## 2019-05-04 MED ORDER — SODIUM CHLORIDE 0.9 % IV BOLUS: 1000.0000 mL | Freq: Once | INTRAVENOUS | Status: DC

## 2019-05-04 MED ORDER — SODIUM CHLORIDE 0.9 % IV BOLUS
1000.0000 mL | Freq: Once | INTRAVENOUS | Status: AC
  Administered 2019-05-04: 1000 mL via INTRAVENOUS

## 2019-05-04 NOTE — ED Provider Notes (Signed)
[facility] Emergency Department Provider Note   ____________________________________________   First MD Initiated Contact with Patient 05/04/19 [phone]     (approximate)  I have reviewed the triage vital signs and the nursing notes.   HISTORY  Chief Complaint Headache and Weakness    HPI Amber Nolan is a 38 y.o. female who presents to the ED from home with a chief complaint of migraine headache.  Patient has a history of migraine headaches for which she takes nortriptyline as a preventative medicine.  Recent telehealth visit with her neurologist Dr. Malvin JohnsPotter for near syncopal episode.  [place] afternoon in the 90 degree heat.  States she slipped and struck her right side; unsure if she struck her head.  Denies LOC.  Subsequently she developed global headache associated with nausea and vomiting.  She was too weak to get out of the water.  States she does get this way with severe migraines which are associated nausea and vomiting.  Also reported shortness of breath to the triage nurse.  Denies recent fever, cough, chest pain, abdominal pain, dysuria, diarrhea.  Denies recent travel or exposure to persons diagnosed with coronavirus.       Past Medical History:  Diagnosis Date   Anxiety and depression    Gastritis    GERD (gastroesophageal reflux disease)    Migraine    Migraines    Scoliosis    Stomach ulcer     Patient Active Problem List   Diagnosis Date Noted   Migraine with aura 01/10/2014    Past Surgical History:  Procedure Laterality Date   APPENDECTOMY  1993   CHOLECYSTECTOMY  2014   EXPLORATORY LAPAROTOMY  2015   RHINOPLASTY     TUBAL LIGATION  2013    Prior to Admission medications   Medication Sig Start Date End Date Taking? Authorizing Provider  ALPRAZolam (XANAX) 0.25 MG tablet alprazolam 0.25 mg tablet  TAKE 1 TABLET BY MOUTH DAILY AS NEEDED    [provider]  busPIRone (BUSPAR) 10 MG tablet Take 10 mg by mouth 3 (three) times daily.    [provider]  citalopram (CELEXA) 40 MG tablet Take 40 mg by mouth daily.    [provider]  cyclobenzaprine (FLEXERIL) 5 MG tablet Take 1 tablet (5 mg total) by mouth 3 (three) times daily as needed for muscle spasms. 05/18/18   Menshew, Charlesetta IvoryJenise V Bacon, PA-C  diclofenac (VOLTAREN) 75 MG EC tablet Take 75 mg by mouth 2 (two) times daily.    [provider]  docusate sodium (STOOL SOFTENER) 100 MG capsule Stool Softener 100 mg capsule  TAKE 1 CAPSULE TWICE A DAY    [provider]  fluconazole (DIFLUCAN) 150 MG tablet fluconazole 150 mg tablet  TAKE 1 TABLET NOW, MAY REPEAT IN 72 HOURS ORALLY 05 DAYS    [provider]  ketorolac (TORADOL) 10 MG tablet Take 1 tablet (10 mg total) by mouth every 8 (eight) hours. 05/18/18   Menshew, Charlesetta IvoryJenise V Bacon, PA-C  medroxyPROGESTERone (PROVERA) 10 MG tablet medroxyprogesterone 10 mg tablet  TAKE 1 TABLET BY MOUTH ONCE DAILY    [provider]  meloxicam (MOBIC) 15 MG tablet meloxicam 15 mg tablet  TAKE 1 TABLET BY MOUTH DAILY    [provider]  Methenamine-Sodium Salicylate (CYSTEX PO) Take by mouth.    [provider]  methocarbamol (ROBAXIN) 500 MG tablet methocarbamol 500 mg tablet  TAKE  1 TABLET BY MOUTH 4 TIMES A DAY    [provider]  methylPREDNISolone (MEDROL) 4 MG tablet methylprednisolone 4 mg tablets in a dose pack  TAKE BY MOUTH AS DIRECTED    [provider]  metroNIDAZOLE (FLAGYL) 500 MG tablet Take 1 tablet (500 mg total) by mouth 2 (two) times daily. 08/29/17   Tommi RumpsSummers, Rhonda L, PA-C  mirtazapine (REMERON) 30 MG tablet mirtazapine 30 mg tablet  TAKE 1 TABLET BY MOUTH AT BEDTIME    [provider]  nabumetone (RELAFEN) 750 MG tablet Take 1 tablet (750 mg total) by mouth 2 (two) times daily. 05/18/18   Menshew, Charlesetta IvoryJenise V Bacon, PA-C  nitrofurantoin,  macrocrystal-monohydrate, (MACROBID) 100 MG capsule nitrofurantoin monohydrate/macrocrystals 100 mg capsule  TAKE 1 CAPSULE WITH FOOD ONCE A DAY ORALLY 30 DAYS    [provider]  ofloxacin (OCUFLOX) 0.3 % ophthalmic solution ofloxacin 0.3 % eye drops  INSTILL 1 DROP INTO RIGHT EYE FOUR TIMES A DAY    [provider]  oxyCODONE-acetaminophen (PERCOCET/ROXICET) 5-325 MG tablet oxycodone-acetaminophen 5 mg-325 mg tablet  TAKE 1 TABLET BY MOUTH EVERY 6 HOURS AS NEEDED FOR PAIN    [provider]  prochlorperazine (COMPAZINE) 10 MG tablet Take 1 tablet (10 mg total) by mouth every 6 (six) hours as needed for nausea (headache). 05/04/19   Irean HongSung, Roneka Gilpin J, MD  promethazine (PHENERGAN) 25 MG suppository promethazine 25 mg rectal suppository  PLACE 1 SUPPOSITORY RECTALLY EVERY 6 HRS AS NEEDED FOR NAUSEA    [provider]  sertraline (ZOLOFT) 50 MG tablet sertraline 50 mg tablet  TAKE 1 TABLET BY MOUTH DAILY    [provider]  SUMAtriptan (IMITREX) 100 MG tablet sumatriptan 100 mg tablet  TAKE 1 TABLET AS NEEDED TAKE AS DIRECTED ORALLY 10 DAYS    [provider]  Topiramate (TOPAMAX PO) Topamax    [provider]  traMADol (ULTRAM) 50 MG tablet tramadol 50 mg tablet  TAKE 1 TABLET BY MOUTH EVERY 6HRS AS NEEDED    [provider]    Allergies Sulfa antibiotics  Family History  Problem Relation Age of Onset   Liver cancer Father        LIVER   Diabetes Other    Hypertension Other    Hypothyroidism Other    Stroke Other    Heart disease Other    Bladder Cancer Neg Hx    Prostate cancer Neg Hx    Kidney cancer Neg Hx     Social History Social History   Tobacco Use   Smoking status: Never Smoker   Smokeless tobacco: Never Used  Substance Use Topics   Alcohol use: Yes    Comment: Social   Drug use: No    Review of Systems  Constitutional: No fever/chills Eyes: No visual changes. ENT: No sore  throat. Cardiovascular: Denies chest pain. Respiratory: Denies shortness of breath. Gastrointestinal: No abdominal pain.  Positive for nausea and vomiting.  No diarrhea.  No constipation. Genitourinary: Negative for dysuria. Musculoskeletal: Negative for back pain. Skin: Negative for rash. Neurological: Positive for headache.  Negative for focal weakness or numbness.   ____________________________________________   PHYSICAL EXAM:  VITAL SIGNS: ED Triage Vitals  Enc Vitals Group     BP 05/03/19 2028 (!) 160/113     Pulse Rate 05/03/19 2028 86     Resp 05/03/19 2028 20     Temp 05/03/19 2028 97.7 F (36.5 C)     Temp Source 05/03/19 2028 Oral  SpO2 05/03/19 2028 96 %     Weight --      Height --      Head Circumference --      Peak Flow --      Pain Score 05/03/19 2029 10     Pain Loc --      Pain Edu? --      Excl. in [place]? --     Constitutional: Alert and oriented. Well appearing and in mild acute distress. Eyes: Conjunctivae are normal. PERRL. EOMI. Head: Atraumatic. Nose: Atraumatic. Mouth/Throat: Mucous membranes are moist.  No dental malocclusion. Neck: No stridor.  No cervical spine tenderness to palpation.  No carotid bruits.  Supple neck without meningismus. Cardiovascular: Normal rate, regular rhythm. Grossly normal heart sounds.  Good peripheral circulation. Respiratory: Normal respiratory effort.  No retractions. Lungs CTAB. Gastrointestinal: Soft and nontender. No distention. No abdominal bruits. No CVA tenderness. Musculoskeletal: No lower extremity tenderness nor edema.  No joint effusions. Neurologic: Alert and oriented x3.  CN II-XII grossly intact.  Normal speech and language. No gross focal neurologic deficits are appreciated. MAEx4. Skin:  Skin is warm, dry and intact. No rash noted. Psychiatric: Mood and affect are normal. Speech and behavior are normal.  ____________________________________________   LABS (all labs ordered are listed, but only  abnormal results are displayed)  Labs Reviewed  BASIC METABOLIC PANEL - Abnormal; Notable for the following components:      Result Value   Potassium 3.3 (*)    Glucose, Bld 174 (*)    Calcium 8.6 (*)    All other components within normal limits  CBC - Abnormal; Notable for the following components:   WBC 19.5 (*)    All other components within normal limits  URINALYSIS, COMPLETE (UACMP) WITH MICROSCOPIC - Abnormal; Notable for the following components:   Color, Urine YELLOW (*)    APPearance HAZY (*)    Ketones, ur 20 (*)    Protein, ur 100 (*)    Bacteria, UA MANY (*)    All other components within normal limits  TROPONIN I  CK  POC URINE PREG, ED   ____________________________________________  EKG  ED ECG REPORT I, Euna Armon J, the attending physician, personally viewed and interpreted this ECG.   Date: 05/04/2019  EKG Time: 2047  Rate: 85  Rhythm: normal EKG, normal sinus rhythm  Axis: Normal  Intervals:none  ST&T Change: Nonspecific  ____________________________________________  RADIOLOGY  ED MD interpretation: No ICH, no acute cardiopulmonary process  Official radiology report(s): Ct Head Wo Contrast  Result Date: 05/04/2019 CLINICAL DATA:  Headache EXAM: CT HEAD WITHOUT CONTRAST TECHNIQUE: Contiguous axial images were obtained from the base of the skull through the vertex without intravenous contrast. COMPARISON:  None. FINDINGS: Brain: There is no mass, hemorrhage or extra-axial collection. The size and configuration of the ventricles and extra-axial CSF spaces are normal. The brain parenchyma is normal, without acute or chronic infarction. Vascular: No abnormal hyperdensity of the major intracranial arteries or dural venous sinuses. No intracranial atherosclerosis. Skull: The visualized skull base, calvarium and extracranial soft tissues are normal. Sinuses/Orbits: No fluid levels or advanced mucosal thickening of the visualized paranasal sinuses. No mastoid or  middle ear effusion. The orbits are normal. IMPRESSION: Normal head CT. Electronically Signed   By: Ulyses Jarred M.D.   On: 05/04/2019 01:44   Dg Chest Port 1 View  Result Date: 05/04/2019 CLINICAL DATA:  38 year old female with shortness of breath. EXAM: PORTABLE CHEST 1 VIEW COMPARISON:  Chest radiograph  dated 12/26/2013 FINDINGS: The lungs are clear. There is no pleural effusion or pneumothorax. The cardiac silhouette is within normal limits. Scoliosis of the thoracic spine. No acute osseous pathology. IMPRESSION: No active disease. Electronically Signed   By: Elgie CollardArash  Radparvar M.D.   On: 05/04/2019 01:38    ____________________________________________   PROCEDURES  Procedure(s) performed (including Critical Care):  Procedures   ____________________________________________   INITIAL IMPRESSION / ASSESSMENT AND PLAN / ED COURSE  As part of my medical decision making, I reviewed the following data within the electronic MEDICAL RECORD NUMBER Nursing notes reviewed and incorporated, Labs reviewed, EKG interpreted, Old chart reviewed, Radiograph reviewed and Notes from prior ED visits     Jamiaya Meriam Spraguelizabeth Dealmeida was evaluated in Emergency Department on 05/04/2019 for the symptoms described in the history of present illness. She was evaluated in the context of the global COVID-19 pandemic, which necessitated consideration that the patient might be at risk for infection with the SARS-CoV-2 virus that causes COVID-19. Institutional protocols and algorithms that pertain to the evaluation of patients at risk for COVID-19 are in a state of rapid change based on information released by regulatory bodies including the CDC and federal and state organizations. These policies and algorithms were followed during the patient's care in the ED.   38 year old female with history of migraine headaches who presents with headache, nausea and vomiting. Differential diagnosis includes, but is not limited to,  intracranial hemorrhage, meningitis/encephalitis, previous head trauma, cavernous venous thrombosis, tension headache, temporal arteritis, migraine or migraine equivalent, idiopathic intracranial hypertension, and non-specific headache.  Patient is afebrile, laboratory results remarkable for leukocytosis.  No focal neurological deficits.  Neck is supple without meningismus.  Will check CK since patient was out in hot weather.  Awaiting urinalysis.  Will obtain CT head given possible head injury.  Initiate IV fluid resuscitation, Compazine and Benadryl.  Will reassess.   Clinical Course as of May 03 624  Tue May 04, 2019  0313 Patient sleeping no acute distress.  Told nurse she did not need to urinate yet.  Second liter IV fluids ordered.  Reportedly told nurse just a few minutes prior to my reassessment that her pain had improved but she was still hurting.  Will administer IV Toradol.   [JS]  0458 POCT negative.    [JS]  [id number] Patient was feeling significantly better.  No focal neurological deficits.  Neck remains supple without meningismus on reexamination.  Feel leukocytosis is likely secondary to stress reaction and dehydration.  Patient was discharged home on Compazine to use as needed.  Strict return precautions were given and patient was discharged home in good condition.   [JS]    Clinical Course User Index [JS] Irean HongSung, Alexxa Sabet J, MD     ____________________________________________   FINAL CLINICAL IMPRESSION(S) / ED DIAGNOSES  Final diagnoses:  Generalized weakness  Other migraine without status migrainosus, not intractable  Dehydration     ED Discharge Orders         Ordered    prochlorperazine (COMPAZINE) 10 MG tablet  Every 6 hours PRN     05/04/19 0511           Note:  This document was prepared using Dragon voice recognition software and may include unintentional dictation errors.   Irean HongSung, Karynn Deblasi J, MD 05/04/19 [phone]

## 2019-05-04 NOTE — ED Notes (Signed)
Spouse called to come pick up pt due to discharge.

## 2019-05-04 NOTE — Discharge Instructions (Signed)
1.  Stay indoors and drink plenty of fluids. 2.  You may take Compazine as needed for headache and nausea. 3.  Return to the ER for worsening symptoms, persistent vomiting, difficulty breathing or other concerns.

## 2019-05-04 NOTE — ED Notes (Signed)
Pt asleep upon entrance to room. EDP Sung verbal to see if pt can urinate yet; pt denies; pt will let this RN know once she can.

## 2019-12-13 ENCOUNTER — Other Ambulatory Visit: Payer: Self-pay

## 2019-12-13 ENCOUNTER — Ambulatory Visit: Payer: Medicaid Other | Attending: Internal Medicine

## 2019-12-13 DIAGNOSIS — Z20822 Contact with and (suspected) exposure to covid-19: Secondary | ICD-10-CM

## 2019-12-15 LAB — NOVEL CORONAVIRUS, NAA: SARS-CoV-2, NAA: DETECTED — AB

## 2019-12-16 ENCOUNTER — Ambulatory Visit (HOSPITAL_COMMUNITY)
Admission: RE | Admit: 2019-12-16 | Discharge: 2019-12-16 | Disposition: A | Discharge status: Home / Self Care | Payer: Medicaid Other | Source: Ambulatory Visit | Attending: Pulmonary Disease | Admitting: Pulmonary Disease

## 2019-12-16 ENCOUNTER — Encounter (HOSPITAL_COMMUNITY): Payer: Self-pay

## 2019-12-16 ENCOUNTER — Other Ambulatory Visit: Payer: Self-pay | Admitting: Physician Assistant

## 2019-12-16 DIAGNOSIS — Z6835 Body mass index (BMI) 35.0-35.9, adult: Secondary | ICD-10-CM | POA: Insufficient documentation

## 2019-12-16 DIAGNOSIS — U071 COVID-19: Secondary | ICD-10-CM | POA: Diagnosis not present

## 2019-12-16 DIAGNOSIS — E669 Obesity, unspecified: Secondary | ICD-10-CM

## 2019-12-16 DIAGNOSIS — Z23 Encounter for immunization: Secondary | ICD-10-CM | POA: Diagnosis present

## 2019-12-16 MED ORDER — SODIUM CHLORIDE 0.9 % IV SOLN
700.0000 mg | Freq: Once | INTRAVENOUS | Status: AC
  Administered 2019-12-16: 12:00:00 700 mg via INTRAVENOUS
  Filled 2019-12-16: qty 20

## 2019-12-16 MED ORDER — EPINEPHRINE 0.3 MG/0.3ML IJ SOAJ: 0.3000 mg | Freq: Once | INTRAMUSCULAR | Status: DC | PRN

## 2019-12-16 MED ORDER — SODIUM CHLORIDE 0.9 % IV SOLN
INTRAVENOUS | Status: DC | PRN
  Administered 2019-12-16: 250 mL via INTRAVENOUS

## 2019-12-16 MED ORDER — METHYLPREDNISOLONE SODIUM SUCC 125 MG IJ SOLR: 125.0000 mg | Freq: Once | INTRAMUSCULAR | Status: DC | PRN

## 2019-12-16 MED ORDER — FAMOTIDINE IN NACL 20-0.9 MG/50ML-% IV SOLN: 20.0000 mg | Freq: Once | INTRAVENOUS | Status: DC | PRN

## 2019-12-16 MED ORDER — ALBUTEROL SULFATE HFA 108 (90 BASE) MCG/ACT IN AERS: 2.0000 | INHALATION_SPRAY | Freq: Once | RESPIRATORY_TRACT | Status: DC | PRN

## 2019-12-16 MED ORDER — DIPHENHYDRAMINE HCL 50 MG/ML IJ SOLN: 50.0000 mg | Freq: Once | INTRAMUSCULAR | Status: DC | PRN

## 2019-12-16 NOTE — Progress Notes (Signed)
  Diagnosis: COVID-19  Physician: Dr. Wright  Procedure: [facility] Infusion Clinic Med: bamlanivimab infusion - Provided patient with bamlanimivab fact sheet for patients, parents and caregivers prior to infusion.  Complications: No immediate complications noted.  Discharge: Discharged home   Chelsee Hosie A 12/16/2019  

## 2019-12-16 NOTE — Progress Notes (Signed)
Hello Rhena Alverta Caccamo,   You have been scheduled to receive bamlanivumab (the monoclonal antibody we discussed) on Thursday 12/16/19 at 1230.  Please arrive 15 minutes early.   The address for the infusion clinic site is:  [address] [place], [place] (previously this was the [facility]   When you drive in the main entrance you will see security -  tell them they are there to get an infusion and they will take you to where you need to go.   If you have questions please call [phone].   Should you develop worsening shortness of breath, chest pain or severe breathing problems please do not wait for this appointment and go to the Emergency room for evaluation and treatment.   The day of your visit you should: Marland Kitchen Get plenty of rest the night before and drink plenty of water . Eat a light meal/snack before coming and take your medications as prescribed  . Wear warm, comfortable clothes with a shirt that can roll-up over the elbow (will need IV start).  . Wear a mask  . Consider bringing some activity to help pass the time  I hope this helps find you feeling better,  Rutherford Guys, PA - C

## 2019-12-16 NOTE — Discharge Instructions (Signed)

## 2020-02-15 ENCOUNTER — Emergency Department (HOSPITAL_COMMUNITY)
Admission: EM | Admit: 2020-02-15 | Discharge: 2020-02-15 | Disposition: A | Discharge status: Home / Self Care | Payer: Medicaid Other | Attending: Emergency Medicine | Admitting: Emergency Medicine

## 2020-02-15 ENCOUNTER — Other Ambulatory Visit: Payer: Self-pay

## 2020-02-15 ENCOUNTER — Encounter (HOSPITAL_COMMUNITY): Payer: Self-pay

## 2020-02-15 ENCOUNTER — Emergency Department (HOSPITAL_COMMUNITY): Payer: Medicaid Other

## 2020-02-15 DIAGNOSIS — R509 Fever, unspecified: Secondary | ICD-10-CM | POA: Diagnosis not present

## 2020-02-15 DIAGNOSIS — Z79899 Other long term (current) drug therapy: Secondary | ICD-10-CM | POA: Diagnosis not present

## 2020-02-15 DIAGNOSIS — R519 Headache, unspecified: Secondary | ICD-10-CM | POA: Diagnosis present

## 2020-02-15 LAB — CBC WITH DIFFERENTIAL/PLATELET
Abs Immature Granulocytes: 0.09 10*3/uL — ABNORMAL HIGH (ref 0.00–0.07)
Basophils Absolute: 0 10*3/uL (ref 0.0–0.1)
Basophils Relative: 0 %
Eosinophils Absolute: 0 10*3/uL (ref 0.0–0.5)
Eosinophils Relative: 0 %
HCT: 38.8 % (ref 36.0–46.0)
Hemoglobin: 12.7 g/dL (ref 12.0–15.0)
Immature Granulocytes: 1 %
Lymphocytes Relative: 5 %
Lymphs Abs: 0.7 10*3/uL (ref 0.7–4.0)
MCH: 30.2 pg (ref 26.0–34.0)
MCHC: 32.7 g/dL (ref 30.0–36.0)
MCV: 92.4 fL (ref 80.0–100.0)
Monocytes Absolute: 0.7 10*3/uL (ref 0.1–1.0)
Monocytes Relative: 5 %
Neutro Abs: 13.1 10*3/uL — ABNORMAL HIGH (ref 1.7–7.7)
Neutrophils Relative %: 89 %
Platelets: 389 10*3/uL (ref 150–400)
RBC: 4.2 MIL/uL (ref 3.87–5.11)
RDW: 13.2 % (ref 11.5–15.5)
WBC: 14.6 10*3/uL — ABNORMAL HIGH (ref 4.0–10.5)
nRBC: 0 % (ref 0.0–0.2)

## 2020-02-15 LAB — COMPREHENSIVE METABOLIC PANEL
ALT: 29 U/L (ref 0–44)
AST: 21 U/L (ref 15–41)
Albumin: 3.9 g/dL (ref 3.5–5.0)
Alkaline Phosphatase: 74 U/L (ref 38–126)
Anion gap: 9 (ref 5–15)
BUN: 17 mg/dL (ref 6–20)
CO2: 26 mmol/L (ref 22–32)
Calcium: 8.5 mg/dL — ABNORMAL LOW (ref 8.9–10.3)
Chloride: 98 mmol/L (ref 98–111)
Creatinine, Ser: 0.77 mg/dL (ref 0.44–1.00)
GFR calc Af Amer: >60 mL/min (ref >60)
GFR calc non Af Amer: >60 mL/min (ref >60)
Glucose, Bld: 132 mg/dL — ABNORMAL HIGH (ref 70–99)
Potassium: 3.6 mmol/L (ref 3.5–5.1)
Sodium: 133 mmol/L — ABNORMAL LOW (ref 135–145)
Total Bilirubin: 0.4 mg/dL (ref 0.3–1.2)
Total Protein: 7.2 g/dL (ref 6.5–8.1)

## 2020-02-15 LAB — URINALYSIS, ROUTINE W REFLEX MICROSCOPIC
Bilirubin Urine: NEGATIVE
Glucose, UA: NEGATIVE mg/dL
Hgb urine dipstick: NEGATIVE
Ketones, ur: NEGATIVE mg/dL
Leukocytes,Ua: NEGATIVE
Nitrite: NEGATIVE
Protein, ur: NEGATIVE mg/dL
Specific Gravity, Urine: 1.027 (ref 1.005–1.030)
pH: 6 (ref 5.0–8.0)

## 2020-02-15 MED ORDER — HYDROMORPHONE HCL 1 MG/ML IJ SOLN
1.0000 mg | Freq: Once | INTRAMUSCULAR | Status: AC
  Administered 2020-02-15: 1 mg via INTRAVENOUS
  Filled 2020-02-15: qty 1

## 2020-02-15 MED ORDER — KETOROLAC TROMETHAMINE 30 MG/ML IJ SOLN
30.0000 mg | Freq: Once | INTRAMUSCULAR | Status: AC
  Administered 2020-02-15: 19:00:00 30 mg via INTRAVENOUS
  Filled 2020-02-15: qty 1

## 2020-02-15 MED ORDER — ONDANSETRON HCL 4 MG/2ML IJ SOLN
4.0000 mg | Freq: Once | INTRAMUSCULAR | Status: AC
  Administered 2020-02-15: 4 mg via INTRAVENOUS
  Filled 2020-02-15: qty 2

## 2020-02-15 MED ORDER — HYDROCODONE-ACETAMINOPHEN 5-325 MG PO TABS: 1.0000 | ORAL_TABLET | Freq: Four times a day (QID) | ORAL | 0 refills | Status: DC | PRN

## 2020-02-15 MED ORDER — ACETAMINOPHEN 325 MG PO TABS
650.0000 mg | ORAL_TABLET | Freq: Once | ORAL | Status: AC
  Administered 2020-02-15: 19:00:00 650 mg via ORAL
  Filled 2020-02-15: qty 2

## 2020-02-15 MED ORDER — SODIUM CHLORIDE 0.9 % IV BOLUS
1000.0000 mL | Freq: Once | INTRAVENOUS | Status: AC
  Administered 2020-02-15: 19:00:00 1000 mL via INTRAVENOUS

## 2020-02-15 NOTE — ED Triage Notes (Signed)
Pt presents to ED with complaints of migraine started this am. Pt nauseated, vomiting and light sensitive. Pt hx migraines.

## 2020-02-15 NOTE — ED Provider Notes (Signed)
[facility] EMERGENCY DEPARTMENT Provider Note   CSN: [id number] Arrival date & time: 02/15/20  1815     History Chief Complaint  Patient presents with  . Migraine    Amber Nolan is a 39 y.o. female.  Patient complains of a migraine headache.  Patient has a history of a Covid infection 2 months ago..  Patient also is on chronic Keflex for urinary tract infections  The history is provided by the patient and medical records. No language interpreter was used.  Migraine This is a recurrent problem. The current episode started 6 to 12 hours ago. The problem occurs constantly. The problem has not changed since onset.Associated symptoms include headaches. Pertinent negatives include no chest pain and no abdominal pain. Nothing aggravates the symptoms. Nothing relieves the symptoms. She has tried nothing for the symptoms. The treatment provided no relief.       Past Medical History:  Diagnosis Date  . Anxiety and depression   . Gastritis   . GERD (gastroesophageal reflux disease)   . Migraine   . Migraines   . Scoliosis   . Stomach ulcer     Patient Active Problem List   Diagnosis Date Noted  . Migraine with aura 01/10/2014    Past Surgical History:  Procedure Laterality Date  . APPENDECTOMY  1993  . CHOLECYSTECTOMY  2014  . EXPLORATORY LAPAROTOMY  2015  . RHINOPLASTY    . TUBAL LIGATION  2013     OB History   No obstetric history on file.     Family History  Problem Relation Age of Onset  . Liver cancer Father        LIVER  . Diabetes Other   . Hypertension Other   . Hypothyroidism Other   . Stroke Other   . Heart disease Other   . Bladder Cancer Neg Hx   . Prostate cancer Neg Hx   . Kidney cancer Neg Hx     Social History   Tobacco Use  . Smoking status: Never Smoker  . Smokeless tobacco: Never Used  Substance Use Topics  . Alcohol use: Yes    Comment: Social  . Drug use: No    Home Medications Prior to Admission medications     Medication Sig Start Date End Date Taking? Authorizing Provider  busPIRone (BUSPAR) 10 MG tablet Take 10 mg by mouth 2 (two) times daily.    Yes [provider]  butalbital-acetaminophen-caffeine (FIORICET) 50-325-40 MG tablet Take 2 tablets by mouth every 4 (four) hours as needed for headache or migraine.  12/29/19  Yes [provider]  cephALEXin (KEFLEX) 250 MG capsule Take 250 mg by mouth at bedtime. 12/29/19  Yes [provider]  citalopram (CELEXA) 40 MG tablet Take 40 mg by mouth daily.   Yes [provider]  cyclobenzaprine (FLEXERIL) 10 MG tablet Take 10 mg by mouth at bedtime. May take 3 times daily as needed 01/17/20  Yes [provider]  diclofenac (VOLTAREN) 75 MG EC tablet Take 75 mg by mouth 2 (two) times daily.   Yes [provider]  lisinopril-hydrochlorothiazide (ZESTORETIC) 20-25 MG tablet Take 1 tablet by mouth daily. 01/17/20  Yes [provider]  nortriptyline (PAMELOR) 10 MG capsule Take 30 mg by mouth at bedtime. 01/30/20  Yes [provider]  HYDROcodone-acetaminophen (NORCO/VICODIN) 5-325 MG tablet Take 1 tablet by mouth every 6 (six) hours as needed. 02/15/20   Bethann Berkshire, MD    Allergies    Sulfa antibiotics  Review of Systems   Review of Systems  Constitutional: Negative for appetite change and fatigue.  HENT: Negative for congestion, ear discharge and sinus pressure.   Eyes: Negative for discharge.  Respiratory: Negative for cough.   Cardiovascular: Negative for chest pain.  Gastrointestinal: Negative for abdominal pain and diarrhea.  Genitourinary: Negative for frequency and hematuria.  Musculoskeletal: Negative for back pain.  Skin: Negative for rash.  Neurological: Positive for headaches. Negative for seizures.  Psychiatric/Behavioral: Negative for hallucinations.    Physical Exam Updated Vital Signs BP 126/71   Pulse (!) 111   Temp (!) 102.1 F (38.9 C) (Oral)   Resp 18   Ht 5'  4" (1.626 m)   Wt 104.3 kg   LMP 02/12/2020   SpO2 94%   BMI 39.48 kg/m   Physical Exam Vitals and nursing note reviewed.  Constitutional:      Appearance: She is well-developed.  HENT:     Head: Normocephalic.     Nose: Nose normal.     Mouth/Throat:     Mouth: Mucous membranes are moist.  Eyes:     General: No scleral icterus.    Conjunctiva/sclera: Conjunctivae normal.  Neck:     Thyroid: No thyromegaly.  Cardiovascular:     Rate and Rhythm: Normal rate and regular rhythm.     Heart sounds: No murmur. No friction rub. No gallop.   Pulmonary:     Breath sounds: No stridor. No wheezing or rales.  Chest:     Chest wall: No tenderness.  Abdominal:     General: There is no distension.     Tenderness: There is no abdominal tenderness. There is no rebound.  Musculoskeletal:        General: Normal range of motion.     Cervical back: Neck supple.  Lymphadenopathy:     Cervical: No cervical adenopathy.  Skin:    Findings: No erythema or rash.  Neurological:     Mental Status: She is alert and oriented to person, place, and time.     Motor: No abnormal muscle tone.     Coordination: Coordination normal.  Psychiatric:        Behavior: Behavior normal.     ED Results / Procedures / Treatments   Labs (all labs ordered are listed, but only abnormal results are displayed) Labs Reviewed  CBC WITH DIFFERENTIAL/PLATELET - Abnormal; Notable for the following components:      Result Value   WBC 14.6 (*)    Neutro Abs 13.1 (*)    Abs Immature Granulocytes 0.09 (*)    All other components within normal limits  COMPREHENSIVE METABOLIC PANEL - Abnormal; Notable for the following components:   Sodium 133 (*)    Glucose, Bld 132 (*)    Calcium 8.5 (*)    All other components within normal limits  URINE CULTURE  CULTURE, BLOOD (ROUTINE X 2)  CULTURE, BLOOD (ROUTINE X 2)  URINALYSIS, ROUTINE W REFLEX MICROSCOPIC    EKG None  Radiology DG Chest Port 1 View  Result Date:  02/15/2020 CLINICAL DATA:  Fever and shortness of breath. EXAM: PORTABLE CHEST 1 VIEW COMPARISON:  May 04, 2019 FINDINGS: The heart size and mediastinal contours are within normal limits. Both lungs are clear. There is stable marked severity levoscoliosis of the upper thoracic spine. IMPRESSION: No active disease. Electronically Signed   By: Aram Candela M.D.   On: 02/15/2020 20:37    Procedures Procedures (including critical care time)  Medications Ordered in  ED Medications  acetaminophen (TYLENOL) tablet 650 mg (650 mg Oral Given 02/15/20 1915)  sodium chloride 0.9 % bolus 1,000 mL (0 mLs Intravenous Stopped 02/15/20 2017)  ketorolac (TORADOL) 30 MG/ML injection 30 mg (30 mg Intravenous Given 02/15/20 1915)  ondansetron (ZOFRAN) injection 4 mg (4 mg Intravenous Given 02/15/20 1915)  HYDROmorphone (DILAUDID) injection 1 mg (1 mg Intravenous Given 02/15/20 2014)    ED Course  I have reviewed the triage vital signs and the nursing notes.  Pertinent labs & imaging results that were available during my care of the patient were reviewed by me and considered in my medical decision making (see chart for details).    MDM Rules/Calculators/A&P                       Patient with febrile illness.  Patient is nontoxic.  She will have urine cultures and blood cultures done and be sent home for follow-up with her PCP.  Suspect viral fever   This patient presents to the ED for concern of headache, this involves an extensive number of treatment options, and is a complaint that carries with it a high risk of complications and morbidity.  The differential diagnosis includes migraine headache.  Meningitis.  Viral syndrome.   Lab Tests:   I Ordered, reviewed, and interpreted labs, which included CBC chemistries and urinalysis.  Labs unremarkable except for mildly elevated white count  Medicines ordered:   I ordered medication IV fluids along with Tylenol and pain medicine.  Patient symptoms  improved  Imaging Studies ordered:   I ordered imaging studies which included chest x-ray and  I independently visualized and interpreted imaging which showed normal  Additional history obtained:   Additional history obtained from old chart  Previous records obtained and reviewed   Consultations Obtained:   Reevaluation:  After the interventions stated above, I reevaluated the patient and found patient symptoms improved after Tylenol pain medicine and fluids.  Critical Interventions:  . Patient with a headache and fever.  Suspect headaches related to the fever.  Labs unremarkable.  Will get urine culture blood culture sent her home with some pain medicine with close follow-up with her doctor.  Doubt this is meningitis since her neck is completely supple, suspect viral disease    Final Clinical Impression(s) / ED Diagnoses Final diagnoses:  Febrile illness, acute    Rx / DC Orders ED Discharge Orders         Ordered    HYDROcodone-acetaminophen (NORCO/VICODIN) 5-325 MG tablet  Every 6 hours PRN     02/15/20 2120           Milton Ferguson, MD 02/15/20 2129

## 2020-02-15 NOTE — Discharge Instructions (Addendum)
Drink plenty of fluids.  Take Tylenol for pain.  Follow-up with her primary care doctor next couple days for recheck

## 2020-02-15 NOTE — ED Notes (Signed)
PT states she [place] her kids [place] the science [place] today and feels like she got too hot and obtained sunburn to face and arms.

## 2020-02-16 ENCOUNTER — Encounter (HOSPITAL_COMMUNITY): Payer: Self-pay

## 2020-02-16 ENCOUNTER — Other Ambulatory Visit: Payer: Self-pay

## 2020-02-16 ENCOUNTER — Emergency Department (HOSPITAL_COMMUNITY)
Admission: EM | Admit: 2020-02-16 | Discharge: 2020-02-17 | Disposition: A | Discharge status: Home / Self Care | Payer: Medicaid Other | Attending: Emergency Medicine | Admitting: Emergency Medicine

## 2020-02-16 ENCOUNTER — Emergency Department (HOSPITAL_COMMUNITY): Payer: Medicaid Other

## 2020-02-16 DIAGNOSIS — R1011 Right upper quadrant pain: Secondary | ICD-10-CM | POA: Diagnosis present

## 2020-02-16 DIAGNOSIS — R519 Headache, unspecified: Secondary | ICD-10-CM | POA: Insufficient documentation

## 2020-02-16 DIAGNOSIS — Z79899 Other long term (current) drug therapy: Secondary | ICD-10-CM | POA: Diagnosis not present

## 2020-02-16 DIAGNOSIS — K529 Noninfective gastroenteritis and colitis, unspecified: Secondary | ICD-10-CM | POA: Insufficient documentation

## 2020-02-16 LAB — CBC WITH DIFFERENTIAL/PLATELET
Abs Immature Granulocytes: 0.04 10*3/uL (ref 0.00–0.07)
Basophils Absolute: 0 10*3/uL (ref 0.0–0.1)
Basophils Relative: 0 %
Eosinophils Absolute: 0 10*3/uL (ref 0.0–0.5)
Eosinophils Relative: 0 %
HCT: 37.6 % (ref 36.0–46.0)
Hemoglobin: 12.4 g/dL (ref 12.0–15.0)
Immature Granulocytes: 0 %
Lymphocytes Relative: 9 %
Lymphs Abs: 1 10*3/uL (ref 0.7–4.0)
MCH: 30.2 pg (ref 26.0–34.0)
MCHC: 33 g/dL (ref 30.0–36.0)
MCV: 91.7 fL (ref 80.0–100.0)
Monocytes Absolute: 0.5 10*3/uL (ref 0.1–1.0)
Monocytes Relative: 5 %
Neutro Abs: 8.9 10*3/uL — ABNORMAL HIGH (ref 1.7–7.7)
Neutrophils Relative %: 86 %
Platelets: 325 10*3/uL (ref 150–400)
RBC: 4.1 MIL/uL (ref 3.87–5.11)
RDW: 13.3 % (ref 11.5–15.5)
WBC: 10.5 10*3/uL (ref 4.0–10.5)
nRBC: 0 % (ref 0.0–0.2)

## 2020-02-16 LAB — URINALYSIS, ROUTINE W REFLEX MICROSCOPIC
Bilirubin Urine: NEGATIVE
Glucose, UA: NEGATIVE mg/dL
Hgb urine dipstick: NEGATIVE
Ketones, ur: 5 mg/dL — AB
Leukocytes,Ua: NEGATIVE
Nitrite: NEGATIVE
Protein, ur: NEGATIVE mg/dL
Specific Gravity, Urine: 1.042 — ABNORMAL HIGH (ref 1.005–1.030)
pH: 6 (ref 5.0–8.0)

## 2020-02-16 LAB — COMPREHENSIVE METABOLIC PANEL
ALT: 33 U/L (ref 0–44)
AST: 23 U/L (ref 15–41)
Albumin: 3.7 g/dL (ref 3.5–5.0)
Alkaline Phosphatase: 62 U/L (ref 38–126)
Anion gap: 10 (ref 5–15)
BUN: 12 mg/dL (ref 6–20)
CO2: 25 mmol/L (ref 22–32)
Calcium: 7.9 mg/dL — ABNORMAL LOW (ref 8.9–10.3)
Chloride: 95 mmol/L — ABNORMAL LOW (ref 98–111)
Creatinine, Ser: 0.72 mg/dL (ref 0.44–1.00)
GFR calc Af Amer: >60 mL/min (ref >60)
GFR calc non Af Amer: >60 mL/min (ref >60)
Glucose, Bld: 129 mg/dL — ABNORMAL HIGH (ref 70–99)
Potassium: 2.9 mmol/L — ABNORMAL LOW (ref 3.5–5.1)
Sodium: 130 mmol/L — ABNORMAL LOW (ref 135–145)
Total Bilirubin: 0.4 mg/dL (ref 0.3–1.2)
Total Protein: 7 g/dL (ref 6.5–8.1)

## 2020-02-16 LAB — I-STAT BETA HCG BLOOD, ED (MC, WL, AP ONLY): I-stat hCG, quantitative: 19.2 m[IU]/mL — ABNORMAL HIGH (ref <5)

## 2020-02-16 LAB — POC URINE PREG, ED: Preg Test, Ur: NEGATIVE

## 2020-02-16 MED ORDER — PROCHLORPERAZINE EDISYLATE 10 MG/2ML IJ SOLN
10.0000 mg | Freq: Once | INTRAMUSCULAR | Status: AC
  Administered 2020-02-16: 10 mg via INTRAVENOUS
  Filled 2020-02-16: qty 2

## 2020-02-16 MED ORDER — MORPHINE SULFATE (PF) 4 MG/ML IV SOLN
4.0000 mg | Freq: Once | INTRAVENOUS | Status: AC
  Administered 2020-02-16: 4 mg via INTRAVENOUS
  Filled 2020-02-16: qty 1

## 2020-02-16 MED ORDER — KETOROLAC TROMETHAMINE 15 MG/ML IJ SOLN
15.0000 mg | Freq: Once | INTRAMUSCULAR | Status: AC
  Administered 2020-02-16: 21:00:00 15 mg via INTRAVENOUS
  Filled 2020-02-16: qty 1

## 2020-02-16 MED ORDER — IOHEXOL 300 MG/ML  SOLN
75.0000 mL | Freq: Once | INTRAMUSCULAR | Status: AC | PRN
  Administered 2020-02-16: 75 mL via INTRAVENOUS

## 2020-02-16 MED ORDER — METRONIDAZOLE IN NACL 5-0.79 MG/ML-% IV SOLN
500.0000 mg | Freq: Once | INTRAVENOUS | Status: AC
  Administered 2020-02-16: 500 mg via INTRAVENOUS
  Filled 2020-02-16: qty 100

## 2020-02-16 MED ORDER — POTASSIUM CHLORIDE CRYS ER 20 MEQ PO TBCR
40.0000 meq | EXTENDED_RELEASE_TABLET | Freq: Once | ORAL | Status: AC
  Administered 2020-02-16: 23:00:00 40 meq via ORAL
  Filled 2020-02-16: qty 2

## 2020-02-16 MED ORDER — DIPHENHYDRAMINE HCL 50 MG/ML IJ SOLN
12.5000 mg | Freq: Once | INTRAMUSCULAR | Status: AC
  Administered 2020-02-16: 12.5 mg via INTRAVENOUS
  Filled 2020-02-16: qty 1

## 2020-02-16 MED ORDER — SODIUM CHLORIDE 0.9 % IV BOLUS
1000.0000 mL | Freq: Once | INTRAVENOUS | Status: AC
  Administered 2020-02-16: 23:00:00 1000 mL via INTRAVENOUS

## 2020-02-16 MED ORDER — CIPROFLOXACIN IN D5W 400 MG/200ML IV SOLN
400.0000 mg | Freq: Once | INTRAVENOUS | Status: AC
  Administered 2020-02-16: 400 mg via INTRAVENOUS
  Filled 2020-02-16: qty 200

## 2020-02-16 MED ORDER — SODIUM CHLORIDE 0.9 % IV BOLUS
1000.0000 mL | Freq: Once | INTRAVENOUS | Status: AC
  Administered 2020-02-16: 1000 mL via INTRAVENOUS

## 2020-02-16 MED ORDER — ACETAMINOPHEN 325 MG PO TABS
650.0000 mg | ORAL_TABLET | Freq: Once | ORAL | Status: AC
  Administered 2020-02-16: 650 mg via ORAL
  Filled 2020-02-16: qty 2

## 2020-02-16 NOTE — Discharge Instructions (Addendum)
You were given a prescription for antibiotics. Please take the antibiotic prescription fully.   Prescription given for Norco. Take medication as directed and do not operate machinery, drive a car, or work while taking this medication as it can make you drowsy.   You were given a prescription for zofran to help with your nausea. Please take as directed.  You were given a short course of potassium because your potassium was low.  Please follow up with your primary doctor within the next 5-7 days.  If you do not have a primary care provider, information for a healthcare clinic has been provided for you to make arrangements for follow up care. Please return to the ER sooner if you have any new or worsening symptoms, or if you have any of the following symptoms:  Abdominal pain that does not go away.  You have a fever.  You keep throwing up (vomiting).  The pain is felt only in portions of the abdomen. Pain in the right side could possibly be appendicitis. In an adult, pain in the left lower portion of the abdomen could be colitis or diverticulitis.  You pass bloody or black tarry stools.  There is bright red blood in the stool.  The constipation stays for more than 4 days.  There is belly (abdominal) or rectal pain.  You do not seem to be getting better.  You have any questions or concerns.

## 2020-02-16 NOTE — ED Triage Notes (Signed)
Pt to er, pt states that she has a hx of uti and she has some flank and back pain, states that she also has a fever, states that her pmd wants her tested for covid, states that she has had covid in the past and this feels differently.  States that she can't be seen in the office until she has had a covid test

## 2020-02-16 NOTE — ED Provider Notes (Signed)
[facility] EMERGENCY DEPARTMENT Provider Note   CSN: [id number] Arrival date & time: 02/16/20  1939     History Chief Complaint  Patient presents with  . Headache  . Generalized Body Aches    Amber Nolan is a 39 y.o. female.  HPI   Pt is a 39 y/o female with a h/o anxiety/depression, GERD, migraines, chlecystectomy, appendectomy, chronic UTI (on keflex for suppressive therapy) who presents to the ED today for eval of multiple complaints including headache, lower back pain, right flank pain that radiates to the right side of the abdomen.  States she was seen in the ED yesterday for headache and fever and after leaving she developed pain to the right flank and abdomen. She also reports body aches, nausea, vomiting, and some diarrhea.Denies dysuria, frequency, or urgency. Denies sore throat, cough, congestion, chest pain. Denies any neck stiffness.  States that her HA is located to the frontal aspect and back of the head. Sxs feel similar to prior migraines.   Past Medical History:  Diagnosis Date  . Anxiety and depression   . Gastritis   . GERD (gastroesophageal reflux disease)   . Migraine   . Migraines   . Scoliosis   . Stomach ulcer     Patient Active Problem List   Diagnosis Date Noted  . Migraine with aura 01/10/2014    Past Surgical History:  Procedure Laterality Date  . APPENDECTOMY  1993  . CHOLECYSTECTOMY  2014  . EXPLORATORY LAPAROTOMY  2015  . RHINOPLASTY    . TUBAL LIGATION  2013     OB History   No obstetric history on file.     Family History  Problem Relation Age of Onset  . Liver cancer Father        LIVER  . Diabetes Other   . Hypertension Other   . Hypothyroidism Other   . Stroke Other   . Heart disease Other   . Bladder Cancer Neg Hx   . Prostate cancer Neg Hx   . Kidney cancer Neg Hx     Social History   Tobacco Use  . Smoking status: Never Smoker  . Smokeless tobacco: Never Used  Substance Use Topics  . Alcohol  use: Yes    Comment: Social  . Drug use: No    Home Medications Prior to Admission medications   Medication Sig Start Date End Date Taking? Authorizing Provider  busPIRone (BUSPAR) 10 MG tablet Take 10 mg by mouth 2 (two) times daily.    Yes [provider]  butalbital-acetaminophen-caffeine (FIORICET) 50-325-40 MG tablet Take 2 tablets by mouth every 4 (four) hours as needed for headache or migraine.  12/29/19  Yes [provider]  cephALEXin (KEFLEX) 250 MG capsule Take 250 mg by mouth at bedtime. 12/29/19  Yes [provider]  citalopram (CELEXA) 40 MG tablet Take 40 mg by mouth daily.   Yes [provider]  cyclobenzaprine (FLEXERIL) 10 MG tablet Take 10 mg by mouth at bedtime. May take 3 times daily as needed 01/17/20  Yes [provider]  diclofenac (VOLTAREN) 75 MG EC tablet Take 75 mg by mouth 2 (two) times daily.   Yes [provider]  lisinopril-hydrochlorothiazide (ZESTORETIC) 20-25 MG tablet Take 1 tablet by mouth daily. 01/17/20  Yes [provider]  nortriptyline (PAMELOR) 10 MG capsule Take 30 mg by mouth at bedtime. 01/30/20  Yes [provider]  ciprofloxacin (CIPRO) 500 MG tablet Take 1 tablet (500 mg total) by  mouth 2 (two) times daily for 7 days. 02/16/20 02/23/20  Pryor Guettler S, PA-C  HYDROcodone-acetaminophen (NORCO/VICODIN) 5-325 MG tablet Take 1 tablet by mouth every 6 (six) hours as needed. 02/17/20   Remonia Otte S, PA-C  metroNIDAZOLE (FLAGYL) 500 MG tablet Take 1 tablet (500 mg total) by mouth 2 (two) times daily for 7 days. 02/16/20 02/23/20  Mardel Grudzien S, PA-C  ondansetron (ZOFRAN) 4 MG tablet Take 1 tablet (4 mg total) by mouth every 6 (six) hours. 02/16/20   Lukis Bunt S, PA-C  potassium chloride (KLOR-CON) 10 MEQ tablet Take 1 tablet (10 mEq total) by mouth daily for 4 days. 02/17/20 02/21/20  Yeiden Frenkel S, PA-C    Allergies    Sulfa antibiotics  Review of Systems   Review of Systems    Constitutional: Positive for chills and fever.  HENT: Negative for ear pain and sore throat.   Eyes: Positive for photophobia. Negative for visual disturbance.  Respiratory: Negative for cough and shortness of breath.   Cardiovascular: Negative for chest pain.  Gastrointestinal: Positive for abdominal pain, diarrhea, nausea and vomiting. Negative for constipation.  Genitourinary: Negative for dysuria and hematuria.  Musculoskeletal: Positive for myalgias.  Skin: Negative for rash.  Neurological: Positive for headaches. Negative for weakness and numbness.  All other systems reviewed and are negative.   Physical Exam Updated Vital Signs BP 128/83   Pulse 93   Temp (!) 100.5 F (38.1 C) (Oral)   Resp 16   Ht 5\' 4"  (1.626 m)   Wt 106.6 kg   LMP 02/12/2020 Comment: Tubal   SpO2 96%   BMI 40.34 kg/m   Physical Exam Vitals and nursing note reviewed.  Constitutional:      General: She is not in acute distress.    Appearance: She is well-developed.  HENT:     Head: Normocephalic and atraumatic.  Eyes:     Conjunctiva/sclera: Conjunctivae normal.  Cardiovascular:     Rate and Rhythm: Regular rhythm. Tachycardia present.     Heart sounds: Normal heart sounds. No murmur.  Pulmonary:     Effort: Pulmonary effort is normal. No respiratory distress.     Breath sounds: Normal breath sounds. No wheezing, rhonchi or rales.  Abdominal:     General: Bowel sounds are normal.     Palpations: Abdomen is soft.     Tenderness: There is abdominal tenderness in the right upper quadrant and right lower quadrant. There is right CVA tenderness, left CVA tenderness and guarding. There is no rebound.  Musculoskeletal:     Cervical back: Neck supple.  Skin:    General: Skin is warm and dry.  Neurological:     Mental Status: She is alert.     ED Results / Procedures / Treatments   Labs (all labs ordered are listed, but only abnormal results are displayed) Labs Reviewed  CBC WITH  DIFFERENTIAL/PLATELET - Abnormal; Notable for the following components:      Result Value   Neutro Abs 8.9 (*)    All other components within normal limits  COMPREHENSIVE METABOLIC PANEL - Abnormal; Notable for the following components:   Sodium 130 (*)    Potassium 2.9 (*)    Chloride 95 (*)    Glucose, Bld 129 (*)    Calcium 7.9 (*)    All other components within normal limits  URINALYSIS, ROUTINE W REFLEX MICROSCOPIC - Abnormal; Notable for the following components:   Specific Gravity, Urine 1.042 (*)    Ketones, ur  5 (*)    All other components within normal limits  I-STAT BETA HCG BLOOD, ED ([facility], [facility], [facility] ONLY) - Abnormal; Notable for the following components:   I-stat hCG, quantitative 19.2 (*)    All other components within normal limits  POC URINE PREG, ED    EKG None  Radiology CT ABDOMEN PELVIS W CONTRAST  Result Date: 02/16/2020 CLINICAL DATA:  Flank and back pain.  Fever. EXAM: CT ABDOMEN AND PELVIS WITH CONTRAST TECHNIQUE: Multidetector CT imaging of the abdomen and pelvis was performed using the standard protocol following bolus administration of intravenous contrast. CONTRAST:  54mL OMNIPAQUE IOHEXOL 300 MG/ML  SOLN COMPARISON:  06/01/2016 FINDINGS: Lower chest: Lung bases are clear. No effusions. Heart is normal size. Hepatobiliary: Diffuse low-density throughout the liver compatible with fatty infiltration. No focal abnormality. Prior cholecystectomy. Pancreas: No focal abnormality or ductal dilatation. Spleen: No focal abnormality.  Normal size. Adrenals/Urinary Tract: 5 mm left upper pole renal stone. No hydronephrosis. Urinary bladder unremarkable. Adrenal glands unremarkable. Stomach/Bowel: Wall thickening within the cecum and ascending colon compatible with colitis. Stomach and small bowel decompressed. Remainder of the colon is unremarkable. Vascular/Lymphatic: No evidence of aneurysm or adenopathy. Reproductive: Uterus and adnexa unremarkable.  No mass. Other: No free  fluid or free air. Musculoskeletal: No acute bony abnormality. IMPRESSION: Wall thickening within the cecum and ascending colon compatible with infectious or inflammatory colitis. Left upper pole nephrolithiasis. No ureteral stones or hydronephrosis. Mild diffuse fatty infiltration of the liver. Electronically Signed   By: Rolm Baptise M.D.   On: 02/16/2020 22:21   DG Chest Port 1 View  Result Date: 02/15/2020 CLINICAL DATA:  Fever and shortness of breath. EXAM: PORTABLE CHEST 1 VIEW COMPARISON:  May 04, 2019 FINDINGS: The heart size and mediastinal contours are within normal limits. Both lungs are clear. There is stable marked severity levoscoliosis of the upper thoracic spine. IMPRESSION: No active disease. Electronically Signed   By: Virgina Norfolk M.D.   On: 02/15/2020 20:37    Procedures Procedures (including critical care time)  Medications Ordered in ED Medications  sodium chloride 0.9 % bolus 1,000 mL (0 mLs Intravenous Stopped 02/16/20 2231)  acetaminophen (TYLENOL) tablet 650 mg (650 mg Oral Given 02/16/20 2052)  ketorolac (TORADOL) 15 MG/ML injection 15 mg (15 mg Intravenous Given 02/16/20 2104)  prochlorperazine (COMPAZINE) injection 10 mg (10 mg Intravenous Given 02/16/20 2102)  diphenhydrAMINE (BENADRYL) injection 12.5 mg (12.5 mg Intravenous Given 02/16/20 2104)  iohexol (OMNIPAQUE) 300 MG/ML solution 75 mL (75 mLs Intravenous Contrast Given 02/16/20 2205)  sodium chloride 0.9 % bolus 1,000 mL (0 mLs Intravenous Stopped 02/16/20 2332)  potassium chloride SA (KLOR-CON) CR tablet 40 mEq (40 mEq Oral Given 02/16/20 2231)  morphine 4 MG/ML injection 4 mg (4 mg Intravenous Given 02/16/20 2338)  ciprofloxacin (CIPRO) IVPB 400 mg (0 mg Intravenous Stopped 02/17/20 0017)  metroNIDAZOLE (FLAGYL) IVPB 500 mg (0 mg Intravenous Stopped 02/17/20 0017)    ED Course  I have reviewed the triage vital signs and the nursing notes.  Pertinent labs & imaging results that were available during my care of the  patient were reviewed by me and considered in my medical decision making (see chart for details).    MDM Rules/Calculators/A&P                       39 year old female presenting for evaluation abdominal pain, nausea, vomiting, diarrhea. She is also had fevers, headaches and body aches. Has history of migraines  and symptoms feel similar. Was seen in the ED yesterday and had very thorough work-up which was reassuring however after being discharged she developed abdominal pain that worsened throughout the night.  On arrival she is febrile and tachycardic but her vital signs are otherwise reassuring.  Reviewed/interpreted labs. CBC did not show any leukocytosis or anemia which is improved from yesterday. CMP showed hypokalemia, hyponatremia, hypochloremia. Normal kidney and liver function.  -Potassium was supplemented in the ED and she was given p.o. potassium for home. She was also given IV fluids for her evidence of dehydration on her CMP. Beta-hCG was slightly positive however urine pregnancy test negative. Suspect false positive UA without evidence for infection.  CT abdomen/pelvis showed evidence of infectious versus inflammatory colitis.  Given that she is systemically ill she was started on Cipro and Flagyl. She was also given IV fluids Tylenol and a headache cocktail. On reassessment she is feeling much improved and her headache is better. Her abdominal pain is also improved. She is had no episodes of vomiting in the ED and has been able to tolerate her potassium as well as oral fluids. Feel that she is appropriate for outpatient management for her gastroenteritis. Have has advised her to follow-up with her PCP in regards to her symptoms return to the ED for new or worsening symptoms. She voiced understanding plan reasons to return. All questions answered. Patient stable for discharge.  Final Clinical Impression(s) / ED Diagnoses Final diagnoses:  Colitis    Rx / DC Orders ED  Discharge Orders         Ordered    HYDROcodone-acetaminophen (NORCO/VICODIN) 5-325 MG tablet  Every 6 hours PRN     02/17/20 0001    potassium chloride (KLOR-CON) 10 MEQ tablet  Daily     02/17/20 0002    ciprofloxacin (CIPRO) 500 MG tablet  2 times daily     02/17/20 0001    metroNIDAZOLE (FLAGYL) 500 MG tablet  2 times daily     02/17/20 0001    ondansetron (ZOFRAN) 4 MG tablet  Every 6 hours     02/17/20 0001           Faatimah Spielberg S, PA-C 02/17/20 0114    Cathren Laine, MD 02/17/20 1641    Cathren Laine, MD 02/17/20 1645

## 2020-02-16 NOTE — ED Notes (Signed)
Pt transported to CT ?

## 2020-02-17 LAB — URINE CULTURE: Culture: NO GROWTH

## 2020-02-17 MED ORDER — POTASSIUM CHLORIDE ER 10 MEQ PO TBCR: 10.0000 meq | EXTENDED_RELEASE_TABLET | Freq: Every day | ORAL | 0 refills | Status: DC

## 2020-02-17 MED ORDER — ONDANSETRON HCL 4 MG PO TABS: 4.0000 mg | ORAL_TABLET | Freq: Four times a day (QID) | ORAL | 0 refills | Status: DC

## 2020-02-17 MED ORDER — METRONIDAZOLE 500 MG PO TABS: 500.0000 mg | ORAL_TABLET | Freq: Two times a day (BID) | ORAL | 0 refills | Status: AC

## 2020-02-17 MED ORDER — CIPROFLOXACIN HCL 500 MG PO TABS: 500.0000 mg | ORAL_TABLET | Freq: Two times a day (BID) | ORAL | 0 refills | Status: AC

## 2020-02-17 MED ORDER — HYDROCODONE-ACETAMINOPHEN 5-325 MG PO TABS: 1.0000 | ORAL_TABLET | Freq: Four times a day (QID) | ORAL | 0 refills | Status: DC | PRN

## 2020-02-20 LAB — CULTURE, BLOOD (ROUTINE X 2)
Culture: NO GROWTH
Culture: NO GROWTH
Special Requests: ADEQUATE

## 2020-02-21 DIAGNOSIS — Z23 Encounter for immunization: Secondary | ICD-10-CM | POA: Diagnosis not present

## 2020-03-20 DIAGNOSIS — Z23 Encounter for immunization: Secondary | ICD-10-CM | POA: Diagnosis not present

## 2020-08-28 DIAGNOSIS — G8929 Other chronic pain: Secondary | ICD-10-CM | POA: Diagnosis not present

## 2020-08-28 DIAGNOSIS — M545 Low back pain, unspecified: Secondary | ICD-10-CM | POA: Diagnosis not present

## 2020-08-28 DIAGNOSIS — M546 Pain in thoracic spine: Secondary | ICD-10-CM | POA: Diagnosis not present

## 2020-08-28 DIAGNOSIS — M4125 Other idiopathic scoliosis, thoracolumbar region: Secondary | ICD-10-CM | POA: Diagnosis not present

## 2020-09-12 DIAGNOSIS — M419 Scoliosis, unspecified: Secondary | ICD-10-CM | POA: Diagnosis not present

## 2020-09-20 DIAGNOSIS — Z1159 Encounter for screening for other viral diseases: Secondary | ICD-10-CM | POA: Diagnosis not present

## 2020-09-20 DIAGNOSIS — M545 Low back pain, unspecified: Secondary | ICD-10-CM | POA: Diagnosis not present

## 2020-09-20 DIAGNOSIS — F418 Other specified anxiety disorders: Secondary | ICD-10-CM | POA: Diagnosis not present

## 2020-09-20 DIAGNOSIS — Z131 Encounter for screening for diabetes mellitus: Secondary | ICD-10-CM | POA: Diagnosis not present

## 2020-09-20 DIAGNOSIS — I10 Essential (primary) hypertension: Secondary | ICD-10-CM | POA: Diagnosis not present

## 2020-09-20 DIAGNOSIS — Z23 Encounter for immunization: Secondary | ICD-10-CM | POA: Diagnosis not present

## 2020-09-20 DIAGNOSIS — Z6837 Body mass index (BMI) 37.0-37.9, adult: Secondary | ICD-10-CM | POA: Diagnosis not present

## 2020-09-26 ENCOUNTER — Ambulatory Visit
Admission: RE | Admit: 2020-09-26 | Discharge: 2020-09-26 | Disposition: A | Discharge status: Home / Self Care | Payer: Medicaid Other | Attending: Nurse Practitioner | Admitting: Nurse Practitioner

## 2020-09-26 ENCOUNTER — Other Ambulatory Visit: Payer: Self-pay | Admitting: Nurse Practitioner

## 2020-09-26 ENCOUNTER — Ambulatory Visit
Admission: RE | Admit: 2020-09-26 | Discharge: 2020-09-26 | Disposition: A | Discharge status: Home / Self Care | Payer: Medicaid Other | Source: Ambulatory Visit | Attending: Nurse Practitioner | Admitting: Nurse Practitioner

## 2020-09-26 DIAGNOSIS — M419 Scoliosis, unspecified: Secondary | ICD-10-CM | POA: Insufficient documentation

## 2020-09-26 DIAGNOSIS — M4185 Other forms of scoliosis, thoracolumbar region: Secondary | ICD-10-CM | POA: Diagnosis not present

## 2020-09-26 DIAGNOSIS — Z981 Arthrodesis status: Secondary | ICD-10-CM | POA: Diagnosis not present

## 2020-09-29 DIAGNOSIS — M419 Scoliosis, unspecified: Secondary | ICD-10-CM | POA: Diagnosis not present

## 2020-12-11 DIAGNOSIS — N390 Urinary tract infection, site not specified: Secondary | ICD-10-CM | POA: Diagnosis not present

## 2020-12-11 DIAGNOSIS — Z1389 Encounter for screening for other disorder: Secondary | ICD-10-CM | POA: Diagnosis not present

## 2020-12-11 DIAGNOSIS — N761 Subacute and chronic vaginitis: Secondary | ICD-10-CM | POA: Diagnosis not present

## 2020-12-26 ENCOUNTER — Encounter (HOSPITAL_COMMUNITY): Payer: Self-pay | Admitting: *Deleted

## 2020-12-26 ENCOUNTER — Other Ambulatory Visit: Payer: Self-pay

## 2020-12-26 ENCOUNTER — Emergency Department (HOSPITAL_COMMUNITY): Payer: Medicaid Other

## 2020-12-26 ENCOUNTER — Emergency Department (HOSPITAL_COMMUNITY)
Admission: EM | Admit: 2020-12-26 | Discharge: 2020-12-26 | Disposition: A | Discharge status: Home / Self Care | Payer: Medicaid Other | Attending: Emergency Medicine | Admitting: Emergency Medicine

## 2020-12-26 DIAGNOSIS — W540XXA Bitten by dog, initial encounter: Secondary | ICD-10-CM | POA: Diagnosis not present

## 2020-12-26 DIAGNOSIS — Z23 Encounter for immunization: Secondary | ICD-10-CM | POA: Diagnosis not present

## 2020-12-26 DIAGNOSIS — S8992XA Unspecified injury of left lower leg, initial encounter: Secondary | ICD-10-CM | POA: Diagnosis present

## 2020-12-26 DIAGNOSIS — S81852A Open bite, left lower leg, initial encounter: Secondary | ICD-10-CM | POA: Diagnosis not present

## 2020-12-26 MED ORDER — AMOXICILLIN-POT CLAVULANATE 875-125 MG PO TABS: 1.0000 | ORAL_TABLET | Freq: Two times a day (BID) | ORAL | 0 refills | Status: DC

## 2020-12-26 MED ORDER — TETANUS-DIPHTH-ACELL PERTUSSIS 5-2.5-18.5 LF-MCG/0.5 IM SUSY
0.5000 mL | PREFILLED_SYRINGE | Freq: Once | INTRAMUSCULAR | Status: AC
  Administered 2020-12-26: 0.5 mL via INTRAMUSCULAR
  Filled 2020-12-26: qty 0.5

## 2020-12-26 MED ORDER — ACETAMINOPHEN 325 MG PO TABS
650.0000 mg | ORAL_TABLET | Freq: Once | ORAL | Status: AC
  Administered 2020-12-26: 650 mg via ORAL
  Filled 2020-12-26: qty 2

## 2020-12-26 NOTE — ED Provider Notes (Signed)
[facility] EMERGENCY DEPARTMENT Provider Note   CSN: [id number] Arrival date & time: 12/26/20  1103     History Chief Complaint  Patient presents with  . Animal Bite    Amber Nolan is a 40 y.o. female with past medical history of anxiety depression, migraines, scoliosis presents the emergency department today for dog bite.  Patient states that she was in between both of her dogs, they started to growl at each other and she thought that they were about to start fighting so she put her leg in the way and one of her dogs accidentally bit her leg.  States that once the dog knew that he had bitten her, he let go.  Patient states that this happened about an hour and half ago.  Is not up-to-date on tetanus.  Patient states that bleeding is controlled, patient wrapped the wound well.  Patient states that both of her dogs are up-to-date on their vaccines including rabies.  States that pain is primarily in the left leg where she was bit, towards ankle.  Not actually on ankle joint.  Is superficial and small.  Is not on any blood thinners.  Did not get bitten anywhere else.  States that she has been able to ambulate on her leg.  Denies any numbness or tingling.  Denies any fevers, nausea or vomiting.  Patient states that she was in her normal health before this.  No other complaints at this time.Marland Kitchen  HPI     Past Medical History:  Diagnosis Date  . Anxiety and depression   . Gastritis   . GERD (gastroesophageal reflux disease)   . Migraine   . Migraines   . Scoliosis   . Stomach ulcer     Patient Active Problem List   Diagnosis Date Noted  . Migraine with aura 01/10/2014    Past Surgical History:  Procedure Laterality Date  . APPENDECTOMY  1993  . CHOLECYSTECTOMY  2014  . EXPLORATORY LAPAROTOMY  2015  . RHINOPLASTY    . TUBAL LIGATION  2013     OB History   No obstetric history on file.     Family History  Problem Relation Age of Onset  . Liver cancer Father         LIVER  . Diabetes Other   . Hypertension Other   . Hypothyroidism Other   . Stroke Other   . Heart disease Other   . Bladder Cancer Neg Hx   . Prostate cancer Neg Hx   . Kidney cancer Neg Hx     Social History   Tobacco Use  . Smoking status: Never Smoker  . Smokeless tobacco: Never Used  Substance Use Topics  . Alcohol use: Yes    Comment: Social  . Drug use: No    Home Medications Prior to Admission medications   Medication Sig Start Date End Date Taking? Authorizing Provider  amoxicillin-clavulanate (AUGMENTIN) 875-125 MG tablet Take 1 tablet by mouth every 12 (twelve) hours. 12/26/20  Yes Leelynn Whetsel, PA-C  busPIRone (BUSPAR) 10 MG tablet Take 10 mg by mouth 2 (two) times daily.     [provider]  butalbital-acetaminophen-caffeine (FIORICET) 50-325-40 MG tablet Take 2 tablets by mouth every 4 (four) hours as needed for headache or migraine.  12/29/19   [provider]  cephALEXin (KEFLEX) 250 MG capsule Take 250 mg by mouth at bedtime. 12/29/19   [provider]  citalopram (CELEXA) 40 MG tablet Take 40 mg by mouth daily.  [provider]  cyclobenzaprine (FLEXERIL) 10 MG tablet Take 10 mg by mouth at bedtime. May take 3 times daily as needed 01/17/20   [provider]  diclofenac (VOLTAREN) 75 MG EC tablet Take 75 mg by mouth 2 (two) times daily.    [provider]  HYDROcodone-acetaminophen (NORCO/VICODIN) 5-325 MG tablet Take 1 tablet by mouth every 6 (six) hours as needed. 02/17/20   Couture, Cortni S, PA-C  lisinopril-hydrochlorothiazide (ZESTORETIC) 20-25 MG tablet Take 1 tablet by mouth daily. 01/17/20   [provider]  nortriptyline (PAMELOR) 10 MG capsule Take 30 mg by mouth at bedtime. 01/30/20   [provider]  ondansetron (ZOFRAN) 4 MG tablet Take 1 tablet (4 mg total) by mouth every 6 (six) hours. 02/16/20   Couture, Cortni S, PA-C  potassium chloride (KLOR-CON) 10 MEQ tablet Take 1 tablet (10  mEq total) by mouth daily for 4 days. 02/17/20 02/21/20  Couture, Cortni S, PA-C    Allergies    Sulfa antibiotics  Review of Systems   Review of Systems  Constitutional: Negative for chills, diaphoresis, fatigue and fever.  HENT: Negative for congestion, sore throat and trouble swallowing.   Eyes: Negative for pain and visual disturbance.  Respiratory: Negative for cough, shortness of breath and wheezing.   Cardiovascular: Negative for chest pain, palpitations and leg swelling.  Gastrointestinal: Negative for abdominal distention, abdominal pain, diarrhea, nausea and vomiting.  Genitourinary: Negative for difficulty urinating.  Musculoskeletal: Negative for back pain, neck pain and neck stiffness.  Skin: Positive for wound. Negative for pallor.  Neurological: Negative for dizziness, speech difficulty, weakness and headaches.  Psychiatric/Behavioral: Negative for confusion.    Physical Exam Updated Vital Signs BP (!) 139/96 (BP Location: Right Arm)   Pulse 75   Temp 98.5 F (36.9 C) (Oral)   Resp 16   Ht 5\' 4"  (1.626 m)   Wt 100 kg   SpO2 98%   BMI 37.84 kg/m   Physical Exam Constitutional:      General: She is not in acute distress.    Appearance: Normal appearance. She is not ill-appearing, toxic-appearing or diaphoretic.  HENT:     Mouth/Throat:     Mouth: Mucous membranes are moist.     Pharynx: Oropharynx is clear.  Eyes:     General: No scleral icterus.    Extraocular Movements: Extraocular movements intact.     Pupils: Pupils are equal, round, and reactive to light.  Cardiovascular:     Rate and Rhythm: Normal rate and regular rhythm.     Pulses: Normal pulses.     Heart sounds: Normal heart sounds.  Pulmonary:     Effort: Pulmonary effort is normal. No respiratory distress.     Breath sounds: Normal breath sounds. No stridor. No wheezing, rhonchi or rales.  Chest:     Chest wall: No tenderness.  Abdominal:     General: Abdomen is flat. There is no  distension.     Palpations: Abdomen is soft.     Tenderness: There is no abdominal tenderness. There is no guarding or rebound.  Musculoskeletal:        General: No swelling or tenderness. Normal range of motion.     Cervical back: Normal range of motion and neck supple. No rigidity.     Right lower leg: No edema.     Left lower leg: No edema.     Comments: Patient is able to range leg in all directions, tenderness around wound.  No warmth.  No normal range of motion to ankle.  Able to ambulate normally.  Normal strength and sensation to lower extremity, PT pulses 2+ and equal.  Skin:    General: Skin is warm and dry.     Capillary Refill: Capillary refill takes less than 2 seconds.     Coloration: Skin is not pale.     Comments: 2 cm wound to lower extremity as seen in picture, medial side.  Bleeding is controlled.  There is also very superficial, closed wound 1 cm to the lateral side as seen in picture.    Neurological:     General: No focal deficit present.     Mental Status: She is alert and oriented to person, place, and time.  Psychiatric:        Mood and Affect: Mood normal.        Behavior: Behavior normal.         ED Results / Procedures / Treatments   Labs (all labs ordered are listed, but only abnormal results are displayed) Labs Reviewed - No data to display  EKG None  Radiology DG Tibia/Fibula Left  Result Date: 12/26/2020 CLINICAL DATA:  Dog bite left lower leg EXAM: LEFT TIBIA AND FIBULA - 2 VIEW COMPARISON:  None. FINDINGS: There is no evidence of fracture or other focal bone lesions. Soft tissue stranding along the medial aspect of the lower leg No radiopaque foreign body. IMPRESSION: Soft tissue stranding without radiopaque foreign body or acute osseous abnormality. Electronically Signed   By: Maudry MayhewJeffrey  Waltz MD   On: 12/26/2020 12:58    Procedures Irrigation  Date/Time: 12/26/2020 1:19 PM Performed by: Farrel GordonPatel, Rutha Melgoza, PA-C Authorized by: Farrel GordonPatel, Jesselle Laflamme,  PA-C  Consent: Verbal consent obtained. Risks and benefits: risks, benefits and alternatives were discussed Consent given by: patient Patient understanding: patient states understanding of the procedure being performed Patient consent: the patient's understanding of the procedure matches consent given Patient identity confirmed: verbally with patient Local anesthesia used: no  Anesthesia: Local anesthesia used: no  Sedation: Patient sedated: no  Patient tolerance: patient tolerated the procedure well with no immediate complications      Medications Ordered in ED Medications  Tdap (BOOSTRIX) injection 0.5 mL (0.5 mLs Intramuscular Given 12/26/20 1237)  acetaminophen (TYLENOL) tablet 650 mg (650 mg Oral Given 12/26/20 1223)    ED Course  I have reviewed the triage vital signs and the nursing notes.  Pertinent labs & imaging results that were available during my care of the patient were reviewed by me and considered in my medical decision making (see chart for details).    MDM Rules/Calculators/A&P                         Amber Nolan is a 40 y.o. female with past medical history of anxiety depression, migraines, scoliosis presents the emergency department today for dog bite.  Wound is superficial, patient is distally neurovascularly intact.  Bleeding is controlled.  Plain films negative.   Patient was properly irrigated, antibiotics prescribed.  Upon reevaluation, patient still distally neurovascularly intact.  Tdap updated.  Patient to be discharged, will have wound check in the next 2 days.  Strict return precautions given.  Patient be discharged.  Doubt need for further emergent work up at this time. I explained the diagnosis and have given explicit precautions to return to the ER including for any other new or worsening symptoms. The patient understands and accepts the medical plan as it's been  dictated and I have answered their questions. Discharge instructions  concerning home care and prescriptions have been given. The patient is STABLE and is discharged to home in good condition.  Final Clinical Impression(s) / ED Diagnoses Final diagnoses:  Dog bite, initial encounter    Rx / DC Orders ED Discharge Orders         Ordered    amoxicillin-clavulanate (AUGMENTIN) 875-125 MG tablet  Every 12 hours        12/26/20 1302           Farrel Gordon, PA-C 12/26/20 1323    Pollyann Savoy, MD 12/26/20 1332

## 2020-12-26 NOTE — ED Notes (Signed)
Patient transported to X-ray 

## 2020-12-26 NOTE — ED Notes (Signed)
An Officer will be coming to talk to Pt about incident.

## 2020-12-26 NOTE — ED Notes (Signed)
Animal control on phone with pt.
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
## 2020-12-26 NOTE — Discharge Instructions (Addendum)
You were seen today for dog bite, please use the attached instructions.  Use the following antibiotics as directed, I want you to have this wound rechecked in the next 2 days, he can either go to your primary care office or come back here for this.  Please use the attached instructions in regards to when to come back.  You can take Tylenol over-the-counter as directed on the bottle for pain.  If you start having fevers, you start noticing streaking or if you have any new worsening concerning symptom please come back to the emergency department.  Your tetanus was updated today.  Get help right away if: You have a red streak going away from your wound. You have any of these coming from your wound: Non-clear fluid. More blood. Pus or a bad smell. You have trouble moving your injured area. You lose feeling (have numbness) or feel tingling anywhere on your body.

## 2021-02-19 ENCOUNTER — Other Ambulatory Visit: Payer: Self-pay | Admitting: Neurosurgery

## 2021-02-19 DIAGNOSIS — M419 Scoliosis, unspecified: Secondary | ICD-10-CM

## 2021-02-19 DIAGNOSIS — R300 Dysuria: Secondary | ICD-10-CM | POA: Diagnosis not present

## 2021-03-21 ENCOUNTER — Emergency Department (HOSPITAL_COMMUNITY): Payer: Medicaid Other

## 2021-03-21 ENCOUNTER — Encounter (HOSPITAL_COMMUNITY): Payer: Self-pay

## 2021-03-21 ENCOUNTER — Other Ambulatory Visit: Payer: Self-pay

## 2021-03-21 ENCOUNTER — Emergency Department (HOSPITAL_COMMUNITY)
Admission: EM | Admit: 2021-03-21 | Discharge: 2021-03-21 | Disposition: A | Discharge status: Home / Self Care | Payer: Medicaid Other | Attending: Emergency Medicine | Admitting: Emergency Medicine

## 2021-03-21 DIAGNOSIS — W5501XA Bitten by cat, initial encounter: Secondary | ICD-10-CM | POA: Diagnosis not present

## 2021-03-21 DIAGNOSIS — S60511A Abrasion of right hand, initial encounter: Secondary | ICD-10-CM | POA: Insufficient documentation

## 2021-03-21 DIAGNOSIS — M7989 Other specified soft tissue disorders: Secondary | ICD-10-CM | POA: Diagnosis not present

## 2021-03-21 DIAGNOSIS — S61250A Open bite of right index finger without damage to nail, initial encounter: Secondary | ICD-10-CM | POA: Diagnosis not present

## 2021-03-21 LAB — BASIC METABOLIC PANEL
Anion gap: 8 (ref 5–15)
BUN: 13 mg/dL (ref 6–20)
CO2: 23 mmol/L (ref 22–32)
Calcium: 8.4 mg/dL — ABNORMAL LOW (ref 8.9–10.3)
Chloride: 102 mmol/L (ref 98–111)
Creatinine, Ser: 0.6 mg/dL (ref 0.44–1.00)
GFR, Estimated: >60 mL/min (ref >60)
Glucose, Bld: 126 mg/dL — ABNORMAL HIGH (ref 70–99)
Potassium: 3.4 mmol/L — ABNORMAL LOW (ref 3.5–5.1)
Sodium: 133 mmol/L — ABNORMAL LOW (ref 135–145)

## 2021-03-21 LAB — CBC WITH DIFFERENTIAL/PLATELET
Abs Immature Granulocytes: 0.09 10*3/uL — ABNORMAL HIGH (ref 0.00–0.07)
Basophils Absolute: 0.1 10*3/uL (ref 0.0–0.1)
Basophils Relative: 0 %
Eosinophils Absolute: 0.1 10*3/uL (ref 0.0–0.5)
Eosinophils Relative: 1 %
HCT: 37 % (ref 36.0–46.0)
Hemoglobin: 12.7 g/dL (ref 12.0–15.0)
Immature Granulocytes: 1 %
Lymphocytes Relative: 10 %
Lymphs Abs: 1.8 10*3/uL (ref 0.7–4.0)
MCH: 32.3 pg (ref 26.0–34.0)
MCHC: 34.3 g/dL (ref 30.0–36.0)
MCV: 94.1 fL (ref 80.0–100.0)
Monocytes Absolute: 1.3 10*3/uL — ABNORMAL HIGH (ref 0.1–1.0)
Monocytes Relative: 8 %
Neutro Abs: 14.4 10*3/uL — ABNORMAL HIGH (ref 1.7–7.7)
Neutrophils Relative %: 80 %
Platelets: 376 10*3/uL (ref 150–400)
RBC: 3.93 MIL/uL (ref 3.87–5.11)
RDW: 12.4 % (ref 11.5–15.5)
WBC: 17.7 10*3/uL — ABNORMAL HIGH (ref 4.0–10.5)
nRBC: 0 % (ref 0.0–0.2)

## 2021-03-21 MED ORDER — OXYCODONE-ACETAMINOPHEN 5-325 MG PO TABS
1.0000 | ORAL_TABLET | ORAL | 0 refills | Status: DC | PRN
Start: 2021-03-21 — End: 2022-04-08

## 2021-03-21 MED ORDER — FENTANYL CITRATE (PF) 100 MCG/2ML IJ SOLN
100.0000 ug | INTRAMUSCULAR | Status: DC | PRN
  Administered 2021-03-21: 100 ug via INTRAVENOUS
  Filled 2021-03-21: qty 2

## 2021-03-21 MED ORDER — SODIUM CHLORIDE 0.9 % IV SOLN
1.0000 g | Freq: Once | INTRAVENOUS | Status: AC
  Administered 2021-03-21: 1 g via INTRAVENOUS
  Filled 2021-03-21: qty 10

## 2021-03-21 MED ORDER — AMOXICILLIN-POT CLAVULANATE 875-125 MG PO TABS: 1.0000 | ORAL_TABLET | Freq: Two times a day (BID) | ORAL | 0 refills | Status: DC

## 2021-03-21 MED ORDER — SODIUM CHLORIDE 0.9 % IV BOLUS
500.0000 mL | Freq: Once | INTRAVENOUS | Status: AC
  Administered 2021-03-21: 500 mL via INTRAVENOUS

## 2021-03-21 NOTE — ED Triage Notes (Signed)
Pt to er, pt states that she got bit by a cat this am, states that she is here for swelling and pain going up her R arm.  States that this was not her cat.

## 2021-03-21 NOTE — ED Provider Notes (Signed)
[facility] EMERGENCY DEPARTMENT Provider Note   CSN: [id number] Arrival date & time: 03/21/21  1847     History Chief Complaint  Patient presents with  . Animal Bite    Amber Nolan is a 40 y.o. female.  HPI She states she was bit by her cat today as she tried to retrieve it from underneath her shed.  She believes that she knows who owns the cat, and plans on working with them to get the cat, and confine it for evaluation.  She would prefer not to get rabies shot initiated tonight.  I explained to her that she has a few days within which to capture the cat, then take it to animal control for observation.  She complains of pain in her right index finger from the cat bites.  She states she had a tetanus booster 2 months ago when she was bit by dog.  There are no other known modifying factors.    Past Medical History:  Diagnosis Date  . Anxiety and depression   . Gastritis   . GERD (gastroesophageal reflux disease)   . Migraine   . Migraines   . Scoliosis   . Stomach ulcer     Patient Active Problem List   Diagnosis Date Noted  . Migraine with aura 01/10/2014    Past Surgical History:  Procedure Laterality Date  . APPENDECTOMY  1993  . CHOLECYSTECTOMY  2014  . EXPLORATORY LAPAROTOMY  2015  . RHINOPLASTY    . TUBAL LIGATION  2013     OB History   No obstetric history on file.     Family History  Problem Relation Age of Onset  . Liver cancer Father        LIVER  . Diabetes Other   . Hypertension Other   . Hypothyroidism Other   . Stroke Other   . Heart disease Other   . Bladder Cancer Neg Hx   . Prostate cancer Neg Hx   . Kidney cancer Neg Hx     Social History   Tobacco Use  . Smoking status: Never Smoker  . Smokeless tobacco: Never Used  Vaping Use  . Vaping Use: Never used  Substance Use Topics  . Alcohol use: Yes    Comment: Social  . Drug use: No    Home Medications Prior to Admission medications   Medication Sig Start Date  End Date Taking? Authorizing Provider  amoxicillin-clavulanate (AUGMENTIN) 875-125 MG tablet Take 1 tablet by mouth every 12 (twelve) hours. 12/26/20   Farrel Gordon, PA-C  busPIRone (BUSPAR) 10 MG tablet Take 10 mg by mouth 2 (two) times daily.     [provider]  butalbital-acetaminophen-caffeine (FIORICET) 50-325-40 MG tablet Take 2 tablets by mouth every 4 (four) hours as needed for headache or migraine.  12/29/19   [provider]  cephALEXin (KEFLEX) 250 MG capsule Take 250 mg by mouth at bedtime. 12/29/19   [provider]  citalopram (CELEXA) 40 MG tablet Take 40 mg by mouth daily.    [provider]  cyclobenzaprine (FLEXERIL) 10 MG tablet Take 10 mg by mouth at bedtime. May take 3 times daily as needed 01/17/20   [provider]  diclofenac (VOLTAREN) 75 MG EC tablet Take 75 mg by mouth 2 (two) times daily.    [provider]  HYDROcodone-acetaminophen (NORCO/VICODIN) 5-325 MG tablet Take 1 tablet by mouth every 6 (six) hours as needed. 02/17/20   Couture, Cortni S, PA-C  lisinopril-hydrochlorothiazide (ZESTORETIC)  20-25 MG tablet Take 1 tablet by mouth daily. 01/17/20   [provider]  nortriptyline (PAMELOR) 10 MG capsule Take 30 mg by mouth at bedtime. 01/30/20   [provider]  ondansetron (ZOFRAN) 4 MG tablet Take 1 tablet (4 mg total) by mouth every 6 (six) hours. 02/16/20   Couture, Cortni S, PA-C  potassium chloride (KLOR-CON) 10 MEQ tablet Take 1 tablet (10 mEq total) by mouth daily for 4 days. 02/17/20 02/21/20  Couture, Cortni S, PA-C    Allergies    Sulfa antibiotics  Review of Systems   Review of Systems  All other systems reviewed and are negative.   Physical Exam Updated Vital Signs BP 127/86 (BP Location: Right Arm)   Pulse 91   Temp 98.4 F (36.9 C) (Oral)   Resp 16   Ht 5\' 4"  (1.626 m)   Wt 97.5 kg   SpO2 100%   BMI 36.90 kg/m   Physical Exam Vitals and nursing note reviewed.  Constitutional:       General: She is in acute distress.     Appearance: She is well-developed. She is not ill-appearing or diaphoretic.  HENT:     Head: Normocephalic and atraumatic.     Right Ear: External ear normal.     Left Ear: External ear normal.  Eyes:     Conjunctiva/sclera: Conjunctivae normal.     Pupils: Pupils are equal, round, and reactive to light.  Neck:     Trachea: Phonation normal.  Cardiovascular:     Rate and Rhythm: Normal rate.  Pulmonary:     Effort: Pulmonary effort is normal.  Abdominal:     General: There is no distension.  Musculoskeletal:        General: Normal range of motion.     Cervical back: Normal range of motion and neck supple.     Comments: Right index finger tender and swollen, with multiple puncture wounds consistent with bites.  Scattered abrasions on dorsal right hand.  Skin:    General: Skin is warm and dry.  Neurological:     Mental Status: She is alert and oriented to person, place, and time.     Cranial Nerves: No cranial nerve deficit.     Sensory: No sensory deficit.     Motor: No abnormal muscle tone.     Coordination: Coordination normal.  Psychiatric:        Mood and Affect: Mood normal.        Behavior: Behavior normal.        Thought Content: Thought content normal.        Judgment: Judgment normal.     ED Results / Procedures / Treatments   Labs (all labs ordered are listed, but only abnormal results are displayed) Labs Reviewed  BASIC METABOLIC PANEL  CBC WITH DIFFERENTIAL/PLATELET    EKG None  Radiology No results found.  Procedures Procedures   Medications Ordered in ED Medications  sodium chloride 0.9 % bolus 500 mL (has no administration in time range)  cefTRIAXone (ROCEPHIN) 1 g in sodium chloride 0.9 % 100 mL IVPB (has no administration in time range)  fentaNYL (SUBLIMAZE) injection 100 mcg (has no administration in time range)    ED Course  I have reviewed the triage vital signs and the nursing  notes.  Pertinent labs & imaging results that were available during my care of the patient were reviewed by me and considered in my medical decision making (see chart for details).  MDM Rules/Calculators/A&P                           Patient Vitals for the past 24 hrs:  BP Temp Temp src Pulse Resp SpO2 Height Weight  03/21/21 1859 127/86 98.4 F (36.9 C) Oral 91 16 100 % -- --  03/21/21 1858 -- -- -- -- -- -- 5\' 4"  (1.626 m) 97.5 kg    At time of discharge, Reevaluation with update and discussion. After initial assessment and treatment, an updated evaluation reveals continue usual medicines.   Medical Decision Making:  This patient is presenting for evaluation of cat bite right index finger, which does require a range of treatment options, and is a complaint that involves a moderate risk of morbidity and mortality. The differential diagnoses include local inflammatory response, infection, rabies exposure. I decided to review old records, and in summary healthy middle-aged female presenting with cat bite, from known animal, that she plans on capturing.  She has early signs of infection with swelling and pain, requiring treat.  I did not require additional historical information from anyone.  Clinical Laboratory Tests Ordered, included CBC and Metabolic panel. Review indicates normal except sodium low, potassium low, glucose high,. Radiologic Tests Ordered, included x-ray index finger.  I independently Visualized: Radiographic images, which show no fracture or foreign body    Critical Interventions-clinical evaluation, laboratory testing, discussion with patient regarding rabies vaccine.  She declined after informed consent and prefers to try to capture the animal and take it to animal control for observation.  Rocephin started in the ED.  After These Interventions, the Patient was reevaluated and was found improved pain, stable for discharge.  Wounds of index finger do  not require closure.  CRITICAL CARE-no Performed by: Mancel Bale  Nursing Notes Reviewed/ Care Coordinated Applicable Imaging Reviewed Interpretation of Laboratory Data incorporated into ED treatment  The patient appears reasonably screened and/or stabilized for discharge and I doubt any other medical condition or other [facility] requiring further screening, evaluation, or treatment in the ED at this time prior to discharge.  Plan: Home Medications-continue usual medicines; Home Treatments-wound care with warm soaks/elevation; return here if the recommended treatment, does not improve the symptoms; Recommended follow up-PCP checkup 2 to 3 days.     Final Clinical Impression(s) / ED Diagnoses Final diagnoses:  Cat bite, initial encounter    Rx / DC Orders ED Discharge Orders         Ordered    amoxicillin-clavulanate (AUGMENTIN) 875-125 MG tablet  2 times daily        03/21/21 2245    oxyCODONE-acetaminophen (PERCOCET) 5-325 MG tablet  Every 4 hours PRN        03/21/21 2245    oxyCODONE-acetaminophen (PERCOCET) 5-325 MG tablet  Every 4 hours PRN        03/21/21 2245        DM   05/21/21, MD 03/23/21 1300

## 2021-03-21 NOTE — Discharge Instructions (Addendum)
Soak finger of your right hand, in warm water 3-4 times a day for 30 minutes.  Wash well with soap and water each time that you soak it.  After drying the finger, you can put a small amount of Neosporin or bacitracin on the wounds to help them heal.  Follow-up with your doctor for checkup in 2 days, to make sure you are doing better.  Return here if needed.

## 2021-03-22 MED FILL — Oxycodone w/ Acetaminophen Tab 5-325 MG: ORAL | Qty: 6 | Status: AC

## 2021-03-23 ENCOUNTER — Emergency Department (HOSPITAL_COMMUNITY)
Admission: EM | Admit: 2021-03-23 | Discharge: 2021-03-23 | Disposition: A | Discharge status: Home / Self Care | Payer: Medicaid Other | Attending: Emergency Medicine | Admitting: Emergency Medicine

## 2021-03-23 ENCOUNTER — Encounter (HOSPITAL_COMMUNITY): Payer: Self-pay

## 2021-03-23 ENCOUNTER — Emergency Department (HOSPITAL_COMMUNITY): Payer: Medicaid Other

## 2021-03-23 ENCOUNTER — Other Ambulatory Visit: Payer: Self-pay

## 2021-03-23 DIAGNOSIS — M7989 Other specified soft tissue disorders: Secondary | ICD-10-CM | POA: Diagnosis not present

## 2021-03-23 DIAGNOSIS — D72829 Elevated white blood cell count, unspecified: Secondary | ICD-10-CM | POA: Diagnosis not present

## 2021-03-23 DIAGNOSIS — L03011 Cellulitis of right finger: Secondary | ICD-10-CM | POA: Insufficient documentation

## 2021-03-23 DIAGNOSIS — Z23 Encounter for immunization: Secondary | ICD-10-CM | POA: Insufficient documentation

## 2021-03-23 DIAGNOSIS — S61451A Open bite of right hand, initial encounter: Secondary | ICD-10-CM | POA: Diagnosis not present

## 2021-03-23 DIAGNOSIS — Z2914 Encounter for prophylactic rabies immune globin: Secondary | ICD-10-CM | POA: Insufficient documentation

## 2021-03-23 DIAGNOSIS — L03113 Cellulitis of right upper limb: Secondary | ICD-10-CM | POA: Diagnosis not present

## 2021-03-23 LAB — BASIC METABOLIC PANEL
Anion gap: 10 (ref 5–15)
BUN: 11 mg/dL (ref 6–20)
CO2: 26 mmol/L (ref 22–32)
Calcium: 8.4 mg/dL — ABNORMAL LOW (ref 8.9–10.3)
Chloride: 100 mmol/L (ref 98–111)
Creatinine, Ser: 0.65 mg/dL (ref 0.44–1.00)
GFR, Estimated: >60 mL/min (ref >60)
Glucose, Bld: 100 mg/dL — ABNORMAL HIGH (ref 70–99)
Potassium: 3.4 mmol/L — ABNORMAL LOW (ref 3.5–5.1)
Sodium: 136 mmol/L (ref 135–145)

## 2021-03-23 LAB — CBC WITH DIFFERENTIAL/PLATELET
Abs Immature Granulocytes: 0.05 10*3/uL (ref 0.00–0.07)
Basophils Absolute: 0 10*3/uL (ref 0.0–0.1)
Basophils Relative: 0 %
Eosinophils Absolute: 0.1 10*3/uL (ref 0.0–0.5)
Eosinophils Relative: 0 %
HCT: 36.6 % (ref 36.0–46.0)
Hemoglobin: 12.2 g/dL (ref 12.0–15.0)
Immature Granulocytes: 0 %
Lymphocytes Relative: 19 %
Lymphs Abs: 2.5 10*3/uL (ref 0.7–4.0)
MCH: 32.4 pg (ref 26.0–34.0)
MCHC: 33.3 g/dL (ref 30.0–36.0)
MCV: 97.1 fL (ref 80.0–100.0)
Monocytes Absolute: 1.2 10*3/uL — ABNORMAL HIGH (ref 0.1–1.0)
Monocytes Relative: 9 %
Neutro Abs: 9.5 10*3/uL — ABNORMAL HIGH (ref 1.7–7.7)
Neutrophils Relative %: 72 %
Platelets: 374 10*3/uL (ref 150–400)
RBC: 3.77 MIL/uL — ABNORMAL LOW (ref 3.87–5.11)
RDW: 12.4 % (ref 11.5–15.5)
WBC: 13.3 10*3/uL — ABNORMAL HIGH (ref 4.0–10.5)
nRBC: 0 % (ref 0.0–0.2)

## 2021-03-23 MED ORDER — OXYCODONE-ACETAMINOPHEN 5-325 MG PO TABS
1.0000 | ORAL_TABLET | Freq: Once | ORAL | Status: AC
  Administered 2021-03-23: 1 via ORAL
  Filled 2021-03-23: qty 1

## 2021-03-23 MED ORDER — RABIES IMMUNE GLOBULIN 150 UNIT/ML IM INJ
20.0000 [IU]/kg | INJECTION | Freq: Once | INTRAMUSCULAR | Status: AC
  Administered 2021-03-23: 1950 [IU] via INTRAMUSCULAR
  Filled 2021-03-23: qty 20

## 2021-03-23 MED ORDER — KETOROLAC TROMETHAMINE 60 MG/2ML IM SOLN
60.0000 mg | Freq: Once | INTRAMUSCULAR | Status: AC
  Administered 2021-03-23: 60 mg via INTRAMUSCULAR
  Filled 2021-03-23: qty 2

## 2021-03-23 MED ORDER — RABIES VACCINE, PCEC IM SUSR
1.0000 mL | Freq: Once | INTRAMUSCULAR | Status: AC
  Administered 2021-03-23: 1 mL via INTRAMUSCULAR
  Filled 2021-03-23: qty 1

## 2021-03-23 MED ORDER — RABIES IMMUNE GLOBULIN 150 UNIT/ML IM INJ
INJECTION | INTRAMUSCULAR | Status: AC
  Filled 2021-03-23: qty 14

## 2021-03-23 MED ORDER — HYDROMORPHONE HCL 1 MG/ML IJ SOLN
1.0000 mg | Freq: Once | INTRAMUSCULAR | Status: AC
  Administered 2021-03-23: 1 mg via INTRAMUSCULAR
  Filled 2021-03-23: qty 1

## 2021-03-23 MED ORDER — DOXYCYCLINE HYCLATE 100 MG PO CAPS: 100.0000 mg | ORAL_CAPSULE | Freq: Two times a day (BID) | ORAL | 0 refills | Status: AC

## 2021-03-23 MED ORDER — ACETAMINOPHEN 325 MG PO TABS
650.0000 mg | ORAL_TABLET | Freq: Once | ORAL | Status: AC
  Administered 2021-03-23: 650 mg via ORAL
  Filled 2021-03-23: qty 2

## 2021-03-23 NOTE — ED Provider Notes (Signed)
[facility]ERGENCY [facility]PARTMENT Provider Note   CSN: [id number] Arrival date & time: 03/23/21  1902     History Chief Complaint  Patient presents with  . Wound Infection    Amber Nolan is a 40 y.o. female.  HPI   Patient with no significant medical history presents to the emergency department with chief complaint of right second digit pain.  Patient endorses that she was bit by a cat on Wednesday and was seen in the emergency department that day, she declined the rabies vaccine and was placed on Augmentin.  Patient states she has been taking Augmentin as prescribed but noted today that her fingers becoming more red more painful and it now has blisters on it.  She endorses subjective fevers and chills and just feeling generally tired.  Patient states she is unable to bend her finger as it is swollen and is very painful to do so, she denies  alleviating factors.  Patient states she has been unable to locate the cat, unsure whether or not the cat is up-to-date on its rabies.  Patient is up-to-date on her tetanus shot, is not immunocompromise, denies alleviating factors.  Patient denies headaches, chest pain, shortness breath, abdominal pain, nausea, vomit, diarrhea, worsening pedal edema.  Past Medical History:  Diagnosis Date  . Anxiety and depression   . Gastritis   . GERD (gastroesophageal reflux disease)   . Migraine   . Migraines   . Scoliosis   . Stomach ulcer     Patient Active Problem List   Diagnosis Date Noted  . Migraine with aura 01/10/2014    Past Surgical History:  Procedure Laterality Date  . APPENDECTOMY  1993  . CHOLECYSTECTOMY  2014  . EXPLORATORY LAPAROTOMY  2015  . RHINOPLASTY    . TUBAL LIGATION  2013     OB History   No obstetric history on file.     Family History  Problem Relation Age of Onset  . Liver cancer Father        LIVER  . Diabetes Other   . Hypertension Other   . Hypothyroidism Other   . Stroke Other   . Heart  disease Other   . Bladder Cancer Neg Hx   . Prostate cancer Neg Hx   . Kidney cancer Neg Hx     Social History   Tobacco Use  . Smoking status: Never Smoker  . Smokeless tobacco: Never Used  Vaping Use  . Vaping Use: Never used  Substance Use Topics  . Alcohol use: Yes    Comment: Social  . Drug use: No    Home Medications Prior to Admission medications   Medication Sig Start Date End Date Taking? Authorizing Provider  doxycycline (VIBRAMYCIN) 100 MG capsule Take 1 capsule (100 mg total) by mouth 2 (two) times daily for 7 days. 03/23/21 03/30/21 Yes Carroll SageFaulkner, Tarahji Ramthun J, PA-C  amoxicillin-clavulanate (AUGMENTIN) 875-125 MG tablet Take 1 tablet by mouth 2 (two) times daily. One po bid x 7 days 03/21/21   Mancel BaleWentz, Elliott, MD  busPIRone (BUSPAR) 10 MG tablet Take 10 mg by mouth 2 (two) times daily.     [provider]  butalbital-acetaminophen-caffeine (FIORICET) 50-325-40 MG tablet Take 2 tablets by mouth every 4 (four) hours as needed for headache or migraine.  12/29/19   [provider]  cephALEXin (KEFLEX) 250 MG capsule Take 250 mg by mouth at bedtime. 12/29/19   [provider]  citalopram (CELEXA) 40 MG tablet Take 40 mg by  mouth daily.    [provider]  cyclobenzaprine (FLEXERIL) 10 MG tablet Take 10 mg by mouth at bedtime. May take 3 times daily as needed 01/17/20   [provider]  diclofenac (VOLTAREN) 75 MG EC tablet Take 75 mg by mouth 2 (two) times daily.    [provider]  HYDROcodone-acetaminophen (NORCO/VICODIN) 5-325 MG tablet Take 1 tablet by mouth every 6 (six) hours as needed. 02/17/20   Couture, Cortni S, PA-C  lisinopril-hydrochlorothiazide (ZESTORETIC) 20-25 MG tablet Take 1 tablet by mouth daily. 01/17/20   [provider]  nortriptyline (PAMELOR) 10 MG capsule Take 30 mg by mouth at bedtime. 01/30/20   [provider]  ondansetron (ZOFRAN) 4 MG tablet Take 1 tablet (4 mg total) by mouth every 6 (six)  hours. 02/16/20   Couture, Cortni S, PA-C  oxyCODONE-acetaminophen (PERCOCET) 5-325 MG tablet Take 1 tablet by mouth every 4 (four) hours as needed for moderate pain or severe pain. 03/21/21   Mancel Bale, MD  oxyCODONE-acetaminophen (PERCOCET) 5-325 MG tablet Take 1 tablet by mouth every 4 (four) hours as needed for moderate pain (Prepack to go). 03/21/21   Mancel Bale, MD  potassium chloride (KLOR-CON) 10 MEQ tablet Take 1 tablet (10 mEq total) by mouth daily for 4 days. 02/17/20 02/21/20  Couture, Cortni S, PA-C    Allergies    Sulfa antibiotics  Review of Systems   Review of Systems  Constitutional: Positive for chills, fatigue and fever.  HENT: Negative for congestion.   Respiratory: Negative for shortness of breath.   Cardiovascular: Negative for chest pain.  Gastrointestinal: Negative for abdominal pain.  Genitourinary: Negative for enuresis.  Musculoskeletal: Negative for back pain.  Skin: Positive for wound. Negative for rash.       Wounds on second digit of right hand.  Neurological: Negative for headaches.  Hematological: Does not bruise/bleed easily.    Physical Exam Updated Vital Signs BP 115/80   Pulse 91   Temp 99.7 F (37.6 C) (Oral)   Resp 18   Ht 5\' 4"  (1.626 m)   Wt 98 kg   LMP 03/11/2021 (Within Days) Comment: Tubal ligation   SpO2 98%   BMI 37.08 kg/m   Physical Exam Vitals and nursing note reviewed.  Constitutional:      General: She is not in acute distress.    Appearance: She is not ill-appearing.  HENT:     Head: Normocephalic and atraumatic.     Nose: No congestion.  Eyes:     Conjunctiva/sclera: Conjunctivae normal.  Cardiovascular:     Rate and Rhythm: Normal rate and regular rhythm.     Pulses: Normal pulses.     Heart sounds: No murmur heard. No friction rub. No gallop.   Pulmonary:     Effort: No respiratory distress.     Breath sounds: No wheezing, rhonchi or rales.  Musculoskeletal:     Comments: Patient's right hand was  visualized there is noted edema and erythema distally to the MCP joint of the right second digit, there are multiple puncture wounds with overlying scabs, there is no active drainage or discharge, area was tender to palpation, warm to the touch, induration noted no fluctuance.  Patient has limited range of motion at her CMP and PIP suspect is secondary to pain versus edema.  Neurovascular fully intact  Skin:    General: Skin is warm and dry.  Neurological:     Mental Status: She is alert.  Psychiatric:  Mood and Affect: Mood normal.     ED Results / Procedures / Treatments   Labs (all labs ordered are listed, but only abnormal results are displayed) Labs Reviewed  CBC WITH DIFFERENTIAL/PLATELET - Abnormal; Notable for the following components:      Result Value   WBC 13.3 (*)    RBC 3.77 (*)    Neutro Abs 9.5 (*)    Monocytes Absolute 1.2 (*)    All other components within normal limits  BASIC METABOLIC PANEL - Abnormal; Notable for the following components:   Potassium 3.4 (*)    Glucose, Bld 100 (*)    Calcium 8.4 (*)    All other components within normal limits    EKG None  Radiology DG Hand Complete Right  Result Date: 03/23/2021 CLINICAL DATA:  Cat bite EXAM: RIGHT HAND - COMPLETE 3+ VIEW COMPARISON:  Right second finger Mar 21, 2021. FINDINGS: Frontal, oblique, and lateral views were obtained. No fracture or dislocation. There is soft tissue swelling of the second digit. The joint spaces appear normal. No erosive change. No soft tissue air or radiopaque foreign body. IMPRESSION: Soft tissue swelling of the second digit. No fracture or dislocation. No arthropathy. No bony destruction. No radiopaque foreign body. Electronically Signed   By: Bretta Bang III M.D.   On: 03/23/2021 20:31    Procedures Procedures   Medications Ordered in ED Medications  rabies immune globulin (HYPERAB/KEDRAB) injection 1,950 Units (has no administration in time range)  rabies  vaccine (RABAVERT) injection 1 mL (has no administration in time range)  HYDROmorphone (DILAUDID) injection 1 mg (1 mg Intramuscular Given 03/23/21 2105)    ED Course  I have reviewed the triage vital signs and the nursing notes.  Pertinent labs & imaging results that were available during my care of the patient were reviewed by me and considered in my medical decision making (see chart for details).    MDM Rules/Calculators/A&P                         Initial impression-patient presents to the emergency department with chief complaint of right hand pain.  She is alert, does not appear to be in acute distress, vital signs reassuring.  Concern for worsening infection of the second digit will obtain imaging, basic lab work-up.  Work-up-CBC shows leukocytosis of 13.3, BMP shows hypokalemia of 3.4, hyperglycemia 100.  Shows diffuse swelling but no other acute abnormalities present.  Rule out- Low suspicion for fracture or dislocation as x-ray does not feel any significant findings. low suspicion for ligament or tendon damage as area was palpated no gross defects noted, patient does have noted decrease in motion at her MCP as well as her PIP joint but there is diffuse swelling which I suspect this is limiting her motion as well as she has pain.  Low suspicion for compartment syndrome as area was palpated it was soft to the touch, neurovascular fully intact.   Plan-  1.  Right second digit pain-suspect secondary due to infection, will add on doxycycline and have her continue with Augmentin to give her full coverage for infection.  will also start patient on rabies vaccine as she did not have it at her last visit.  Will recommend that she comes back in 48 hours for recheck of her hand.   Vital signs have remained stable, no indication for hospital admission.  Patient discussed with attending and they agreed with assessment and plan.  Patient  given at home care as well strict return precautions.   Patient verbalized that they understood agreed to said plan.   Final Clinical Impression(s) / ED Diagnoses Final diagnoses:  Cellulitis of finger of right hand    Rx / DC Orders ED Discharge Orders         Ordered    doxycycline (VIBRAMYCIN) 100 MG capsule  2 times daily        03/23/21 2116           Carroll Sage, PA-C 03/23/21 2135    Derwood Kaplan, MD 03/27/21 [phone]

## 2021-03-23 NOTE — ED Triage Notes (Signed)
PT was bit by cat on 5/11- seen here, received antibiotics, redness, pain, and new blisters present today. Pt reports PCP told her to come back to ED for re-evaluation.

## 2021-03-23 NOTE — Discharge Instructions (Addendum)
I suspect you have an infection of your right index finger, starting you on antibiotics please take the doxycycline and Augmentin as prescribed.  You may also continue taking your narcotic medication for pain.  You have gotten your first rabies vaccine today, you will have to follow-up on day 3, 7, 14 for your following vaccines, I have given you a printout on when you are supposed to receive your following vaccines please read. You may go to an urgent care for these vaccines please call ahead to sure that they have it.  I want to you come back to the emergency department if your finger becomes more red, swollen, redness goes past the line drawn on your finger, you develop fevers, chills or pain worsens as you may have to have IV antibiotics.

## 2021-03-28 DIAGNOSIS — M79641 Pain in right hand: Secondary | ICD-10-CM | POA: Diagnosis not present

## 2021-03-28 DIAGNOSIS — F418 Other specified anxiety disorders: Secondary | ICD-10-CM | POA: Diagnosis not present

## 2021-04-18 DIAGNOSIS — Z1389 Encounter for screening for other disorder: Secondary | ICD-10-CM | POA: Diagnosis not present

## 2021-04-18 DIAGNOSIS — R399 Unspecified symptoms and signs involving the genitourinary system: Secondary | ICD-10-CM | POA: Diagnosis not present

## 2021-04-18 DIAGNOSIS — M79641 Pain in right hand: Secondary | ICD-10-CM | POA: Diagnosis not present

## 2021-04-18 DIAGNOSIS — F418 Other specified anxiety disorders: Secondary | ICD-10-CM | POA: Diagnosis not present

## 2021-04-26 DIAGNOSIS — S60940D Unspecified superficial injury of right index finger, subsequent encounter: Secondary | ICD-10-CM | POA: Diagnosis not present

## 2021-04-26 DIAGNOSIS — W5501XD Bitten by cat, subsequent encounter: Secondary | ICD-10-CM | POA: Diagnosis not present

## 2021-06-09 ENCOUNTER — Encounter (HOSPITAL_COMMUNITY): Payer: Self-pay | Admitting: Emergency Medicine

## 2021-06-09 ENCOUNTER — Other Ambulatory Visit: Payer: Self-pay

## 2021-06-09 ENCOUNTER — Emergency Department (HOSPITAL_COMMUNITY)
Admission: EM | Admit: 2021-06-09 | Discharge: 2021-06-09 | Disposition: A | Discharge status: Home / Self Care | Payer: BC Managed Care – PPO | Attending: Emergency Medicine | Admitting: Emergency Medicine

## 2021-06-09 ENCOUNTER — Emergency Department (HOSPITAL_COMMUNITY): Payer: BC Managed Care – PPO

## 2021-06-09 DIAGNOSIS — R110 Nausea: Secondary | ICD-10-CM | POA: Diagnosis not present

## 2021-06-09 DIAGNOSIS — R197 Diarrhea, unspecified: Secondary | ICD-10-CM | POA: Insufficient documentation

## 2021-06-09 DIAGNOSIS — N390 Urinary tract infection, site not specified: Secondary | ICD-10-CM

## 2021-06-09 DIAGNOSIS — K219 Gastro-esophageal reflux disease without esophagitis: Secondary | ICD-10-CM | POA: Insufficient documentation

## 2021-06-09 DIAGNOSIS — R109 Unspecified abdominal pain: Secondary | ICD-10-CM | POA: Diagnosis present

## 2021-06-09 LAB — URINALYSIS, ROUTINE W REFLEX MICROSCOPIC
Bilirubin Urine: NEGATIVE
Glucose, UA: NEGATIVE mg/dL
Hgb urine dipstick: NEGATIVE
Ketones, ur: NEGATIVE mg/dL
Nitrite: NEGATIVE
Protein, ur: 30 mg/dL — AB
Specific Gravity, Urine: 1.021 (ref 1.005–1.030)
WBC, UA: >50 WBC/hpf — ABNORMAL HIGH (ref 0–5)
pH: 6 (ref 5.0–8.0)

## 2021-06-09 LAB — CBC WITH DIFFERENTIAL/PLATELET
Abs Immature Granulocytes: 0.03 10*3/uL (ref 0.00–0.07)
Basophils Absolute: 0.1 10*3/uL (ref 0.0–0.1)
Basophils Relative: 1 %
Eosinophils Absolute: 0.1 10*3/uL (ref 0.0–0.5)
Eosinophils Relative: 2 %
HCT: 37.6 % (ref 36.0–46.0)
Hemoglobin: 12.5 g/dL (ref 12.0–15.0)
Immature Granulocytes: 0 %
Lymphocytes Relative: 31 %
Lymphs Abs: 2.3 10*3/uL (ref 0.7–4.0)
MCH: 31.8 pg (ref 26.0–34.0)
MCHC: 33.2 g/dL (ref 30.0–36.0)
MCV: 95.7 fL (ref 80.0–100.0)
Monocytes Absolute: 0.7 10*3/uL (ref 0.1–1.0)
Monocytes Relative: 9 %
Neutro Abs: 4.2 10*3/uL (ref 1.7–7.7)
Neutrophils Relative %: 57 %
Platelets: 352 10*3/uL (ref 150–400)
RBC: 3.93 MIL/uL (ref 3.87–5.11)
RDW: 12.5 % (ref 11.5–15.5)
WBC: 7.4 10*3/uL (ref 4.0–10.5)
nRBC: 0 % (ref 0.0–0.2)

## 2021-06-09 LAB — PREGNANCY, URINE: Preg Test, Ur: NEGATIVE

## 2021-06-09 LAB — COMPREHENSIVE METABOLIC PANEL
ALT: 33 U/L (ref 0–44)
AST: 19 U/L (ref 15–41)
Albumin: 3.8 g/dL (ref 3.5–5.0)
Alkaline Phosphatase: 54 U/L (ref 38–126)
Anion gap: 5 (ref 5–15)
BUN: 10 mg/dL (ref 6–20)
CO2: 27 mmol/L (ref 22–32)
Calcium: 8.7 mg/dL — ABNORMAL LOW (ref 8.9–10.3)
Chloride: 106 mmol/L (ref 98–111)
Creatinine, Ser: 0.58 mg/dL (ref 0.44–1.00)
GFR, Estimated: >60 mL/min (ref >60)
Glucose, Bld: 102 mg/dL — ABNORMAL HIGH (ref 70–99)
Potassium: 3.6 mmol/L (ref 3.5–5.1)
Sodium: 138 mmol/L (ref 135–145)
Total Bilirubin: 0.4 mg/dL (ref 0.3–1.2)
Total Protein: 6.7 g/dL (ref 6.5–8.1)

## 2021-06-09 MED ORDER — NITROFURANTOIN MONOHYD MACRO 100 MG PO CAPS: 100.0000 mg | ORAL_CAPSULE | Freq: Two times a day (BID) | ORAL | 0 refills | Status: DC

## 2021-06-09 MED ORDER — FENTANYL CITRATE (PF) 100 MCG/2ML IJ SOLN
25.0000 ug | Freq: Once | INTRAMUSCULAR | Status: AC
  Administered 2021-06-09: 25 ug via INTRAVENOUS
  Filled 2021-06-09: qty 2

## 2021-06-09 MED ORDER — SODIUM CHLORIDE 0.9 % IV BOLUS
1000.0000 mL | Freq: Once | INTRAVENOUS | Status: AC
  Administered 2021-06-09: 1000 mL via INTRAVENOUS

## 2021-06-09 MED ORDER — KETOROLAC TROMETHAMINE 30 MG/ML IJ SOLN
30.0000 mg | Freq: Once | INTRAMUSCULAR | Status: AC
Start: 2021-06-09 — End: 2021-06-09
  Administered 2021-06-09: 30 mg via INTRAVENOUS
  Filled 2021-06-09: qty 1

## 2021-06-09 MED ORDER — HYDROMORPHONE HCL 1 MG/ML IJ SOLN
0.5000 mg | Freq: Once | INTRAMUSCULAR | Status: AC
  Administered 2021-06-09: 0.5 mg via INTRAVENOUS
  Filled 2021-06-09: qty 1

## 2021-06-09 MED ORDER — NITROFURANTOIN MONOHYD MACRO 100 MG PO CAPS
100.0000 mg | ORAL_CAPSULE | Freq: Once | ORAL | Status: AC
  Administered 2021-06-09: 100 mg via ORAL
  Filled 2021-06-09: qty 1

## 2021-06-09 NOTE — ED Triage Notes (Signed)
Pt c/o chronic UTI's, pt states she began having flank pain for the past week. Pt scheduled to see urology next week.

## 2021-06-09 NOTE — ED Provider Notes (Signed)
[facility] Provider Note   CSN: [id number] Arrival date & time: 06/09/21  1421     History Chief Complaint  Patient presents with   Abdominal Pain    Amber Nolan is a 40 y.o. female with a past medical history of GERD, anxiety, chronic UTIs presenting to the ED with a chief complaint of dysuria and flank pain.  About 1 month ago she had similar symptoms but negative UA.  She was told to follow-up with her urologist and she has an appointment next week however her pain has worsened despite taking Tylenol and ibuprofen.  She reports mostly right-sided flank pain with intermittent left-sided pain.  She denies any fevers but does endorse chills.  Reports some diarrhea and nausea but denies any vomiting.  No pelvic complaints.  Denies any chest pain, shortness of breath.  HPI     Past Medical History:  Diagnosis Date   Anxiety and depression    Gastritis    GERD (gastroesophageal reflux disease)    Migraine    Migraines    Scoliosis    Stomach ulcer     Patient Active Problem List   Diagnosis Date Noted   Migraine with aura 01/10/2014    Past Surgical History:  Procedure Laterality Date   APPENDECTOMY  1993   CHOLECYSTECTOMY  2014   EXPLORATORY LAPAROTOMY  2015   RHINOPLASTY     TUBAL LIGATION  2013     OB History   No obstetric history on file.     Family History  Problem Relation Age of Onset   Liver cancer Father        LIVER   Diabetes Other    Hypertension Other    Hypothyroidism Other    Stroke Other    Heart disease Other    Bladder Cancer Neg Hx    Prostate cancer Neg Hx    Kidney cancer Neg Hx     Social History   Tobacco Use   Smoking status: Never   Smokeless tobacco: Never  Vaping Use   Vaping Use: Never used  Substance Use Topics   Alcohol use: Yes    Comment: Social   Drug use: No    Home Medications Prior to Admission medications   Medication Sig Start Date End Date Taking? Authorizing Provider   nitrofurantoin, macrocrystal-monohydrate, (MACROBID) 100 MG capsule Take 1 capsule (100 mg total) by mouth 2 (two) times daily. 06/09/21  Yes Patsye Sullivant, PA-C  amoxicillin-clavulanate (AUGMENTIN) 875-125 MG tablet Take 1 tablet by mouth 2 (two) times daily. One po bid x 7 days 03/21/21   Mancel BaleWentz, Elliott, MD  busPIRone (BUSPAR) 10 MG tablet Take 10 mg by mouth 2 (two) times daily.     [provider]  butalbital-acetaminophen-caffeine (FIORICET) 50-325-40 MG tablet Take 2 tablets by mouth every 4 (four) hours as needed for headache or migraine.  12/29/19   [provider]  cephALEXin (KEFLEX) 250 MG capsule Take 250 mg by mouth at bedtime. 12/29/19   [provider]  citalopram (CELEXA) 40 MG tablet Take 40 mg by mouth daily.    [provider]  cyclobenzaprine (FLEXERIL) 10 MG tablet Take 10 mg by mouth at bedtime. May take 3 times daily as needed 01/17/20   [provider]  diclofenac (VOLTAREN) 75 MG EC tablet Take 75 mg by mouth 2 (two) times daily.    [provider]  HYDROcodone-acetaminophen (NORCO/VICODIN) 5-325 MG tablet Take 1 tablet by mouth every 6 (  six) hours as needed. 02/17/20   Couture, Cortni S, PA-C  lisinopril-hydrochlorothiazide (ZESTORETIC) 20-25 MG tablet Take 1 tablet by mouth daily. 01/17/20   [provider]  nortriptyline (PAMELOR) 10 MG capsule Take 30 mg by mouth at bedtime. 01/30/20   [provider]  ondansetron (ZOFRAN) 4 MG tablet Take 1 tablet (4 mg total) by mouth every 6 (six) hours. 02/16/20   Couture, Cortni S, PA-C  oxyCODONE-acetaminophen (PERCOCET) 5-325 MG tablet Take 1 tablet by mouth every 4 (four) hours as needed for moderate pain or severe pain. 03/21/21   Mancel Bale, MD  oxyCODONE-acetaminophen (PERCOCET) 5-325 MG tablet Take 1 tablet by mouth every 4 (four) hours as needed for moderate pain (Prepack to go). 03/21/21   Mancel Bale, MD  potassium chloride (KLOR-CON) 10 MEQ tablet Take 1 tablet  (10 mEq total) by mouth daily for 4 days. 02/17/20 02/21/20  Couture, Cortni S, PA-C    Allergies    Sulfa antibiotics  Review of Systems   Review of Systems  Constitutional:  Negative for appetite change, chills and fever.  HENT:  Negative for ear pain, rhinorrhea, sneezing and sore throat.   Eyes:  Negative for photophobia and visual disturbance.  Respiratory:  Negative for cough, chest tightness, shortness of breath and wheezing.   Cardiovascular:  Negative for chest pain and palpitations.  Gastrointestinal:  Negative for abdominal pain, blood in stool, constipation, diarrhea, nausea and vomiting.  Genitourinary:  Positive for dysuria and flank pain. Negative for hematuria, urgency and vaginal discharge.  Musculoskeletal:  Negative for myalgias.  Skin:  Negative for rash.  Neurological:  Negative for dizziness, weakness and light-headedness.   Physical Exam Updated Vital Signs BP 123/83   Pulse 78   Temp 98.5 F (36.9 C) (Oral)   Resp 19   Ht 5\' 4"  (1.626 m)   Wt 99.8 kg   SpO2 100%   BMI 37.76 kg/m   Physical Exam Vitals and nursing note reviewed.  Constitutional:      General: She is not in acute distress.    Appearance: She is well-developed.  HENT:     Head: Normocephalic and atraumatic.     Nose: Nose normal.  Eyes:     General: No scleral icterus.       Left eye: No discharge.     Conjunctiva/sclera: Conjunctivae normal.  Cardiovascular:     Rate and Rhythm: Normal rate and regular rhythm.     Heart sounds: Normal heart sounds. No murmur heard.   No friction rub. No gallop.  Pulmonary:     Effort: Pulmonary effort is normal. No respiratory distress.     Breath sounds: Normal breath sounds.  Abdominal:     General: Bowel sounds are normal. There is no distension.     Palpations: Abdomen is soft.     Tenderness: There is abdominal tenderness (Bilateral flank). There is no guarding.  Musculoskeletal:        General: Normal range of motion.     Cervical  back: Normal range of motion and neck supple.  Skin:    General: Skin is warm and dry.     Findings: No rash.  Neurological:     Mental Status: She is alert.     Motor: No abnormal muscle tone.     Coordination: Coordination normal.    ED Results / Procedures / Treatments   Labs (all labs ordered are listed, but only abnormal results are displayed) Labs Reviewed  URINALYSIS, ROUTINE W REFLEX  MICROSCOPIC - Abnormal; Notable for the following components:      Result Value   APPearance HAZY (*)    Protein, ur 30 (*)    Leukocytes,Ua LARGE (*)    WBC, UA >50 (*)    Bacteria, UA RARE (*)    All other components within normal limits  COMPREHENSIVE METABOLIC PANEL - Abnormal; Notable for the following components:   Glucose, Bld 102 (*)    Calcium 8.7 (*)    All other components within normal limits  URINE CULTURE  PREGNANCY, URINE  CBC WITH DIFFERENTIAL/PLATELET    EKG None  Radiology CT Renal Stone Study  Result Date: 06/09/2021 CLINICAL DATA:  chronic UTI's, pt states she began having bilateral flank pain for the past week, nausea, diarrhea. EXAM: CT ABDOMEN AND PELVIS WITHOUT CONTRAST TECHNIQUE: Multidetector CT imaging of the abdomen and pelvis was performed following the standard protocol without IV contrast. COMPARISON:  CT abdomen pelvis 02/16/2020 FINDINGS: Lower chest: Mild right basilar opacities, favor atelectasis. Left lung bases clear. Evaluation of the abdominal viscera limited by the lack of IV contrast. Hepatobiliary: Diffuse hepatic steatosis. No focal lesion identified. Gallbladder surgically absent. Pancreas: Unremarkable. No surrounding inflammatory changes. Spleen: Normal in size without focal abnormality. Adrenals/Urinary Tract: Adrenal glands are unremarkable. There is again a nonobstructing calculus in the left kidney measuring 5 mm. No right renal calculi. No hydronephrosis. No mass lesion. Urinary bladder is unremarkable. Stomach/Bowel: Stomach is within normal  limits. Appendix not well visualized. No evidence of bowel wall thickening, distention, or inflammatory changes. Vascular/Lymphatic: Vascular patency cannot be assessed in the absence IV contrast. No enlarged pelvic or abdominal lymph nodes. Reproductive: Uterus and bilateral adnexa are unremarkable. Other: No abdominal wall hernia or abnormality. No abdominopelvic ascites. Musculoskeletal: Lower lumbar spine degenerative disc disease. IMPRESSION: 1. No acute finding in the abdomen or pelvis on a noncontrast exam. 2. Nonobstructing 5 mm calculus in the left kidney. 3. Diffuse hepatic steatosis. 4. Mild right basilar pulmonary opacities, favor atelectasis. Electronically Signed   By: Emmaline Kluver M.D.   On: 06/09/2021 16:56    Procedures Procedures   Medications Ordered in ED Medications  HYDROmorphone (DILAUDID) injection 0.5 mg (has no administration in time range)  nitrofurantoin (macrocrystal-monohydrate) (MACROBID) capsule 100 mg (has no administration in time range)  ketorolac (TORADOL) 30 MG/ML injection 30 mg (has no administration in time range)  sodium chloride 0.9 % bolus 1,000 mL (0 mLs Intravenous Stopped 06/09/21 1656)  fentaNYL (SUBLIMAZE) injection 25 mcg (25 mcg Intravenous Given 06/09/21 1535)    ED Course  I have reviewed the triage vital signs and the nursing notes.  Pertinent labs & imaging results that were available during my care of the patient were reviewed by me and considered in my medical decision making (see chart for details).  Clinical Course as of 06/09/21 1726  Sat Jun 09, 2021  1603 WBC: 7.4 [HK]  1603 Preg Test, Ur: NEGATIVE [HK]  1608 Leukocytes,Ua(!): LARGE [HK]  1608 Bacteria, UA(!): RARE [HK]  1608 WBC, UA(!): >50 [HK]  1646 Creatinine: 0.58 [HK]    Clinical Course User Index [HK] Dietrich Pates, PA-C   MDM Rules/Calculators/A&P                           40 year old female presenting to the ED for dysuria, bilateral flank pain, right greater  than left.  Has a history of chronic UTIs and is concerned for the same.  She denies any  fevers but reports chills.  On exam there is tenderness of bilateral flank area.  No rebound or guarding.  She is afebrile here and last use of antipyretics was yesterday.  Will obtain urinalysis, lab work and reassess.  Urinalysis has large leukocytes, pyuria and rare bacteria.  This was sent for culture due to her history of recurrent UTIs.  CBC, CMP unremarkable.  Pregnancy test is negative.  CT renal stone study showed shows nonobstructing 5 mm stone in the left side without any other acute abnormalities.  Suspect that her symptoms are due to her UTI.  Will treat with Macrobid and have her follow-up with urology next week as she has been previously scheduled.  Return precautions given.    Patient is hemodynamically stable, in NAD, and able to ambulate in the ED. Evaluation does not show pathology that would require ongoing emergent intervention or inpatient treatment. I explained the diagnosis to the patient. Pain has been managed and has no complaints prior to discharge. Patient is comfortable with above plan and is stable for discharge at this time. All questions were answered prior to disposition. Strict return precautions for returning to the ED were discussed. Encouraged follow up with PCP.   An After Visit Summary was printed and given to the patient.   Portions of this note were generated with Scientist, clinical (histocompatibility and immunogenetics). Dictation errors may occur despite best attempts at proofreading.  Final Clinical Impression(s) / ED Diagnoses Final diagnoses:  Lower urinary tract infectious disease    Rx / DC Orders ED Discharge Orders          Ordered    nitrofurantoin, macrocrystal-monohydrate, (MACROBID) 100 MG capsule  2 times daily        06/09/21 1723             Dietrich Pates, PA-C 06/09/21 1726    Pollyann Savoy, MD 06/09/21 2308

## 2021-06-09 NOTE — Discharge Instructions (Addendum)
There is a 5 mm kidney stone in the left kidney that may cause you pain in the future it is not obstructing at this time. You have a UTI.  I have sent your urine for culture but will place you on Macrobid. Take ibuprofen and Tylenol to help with your pain. Return to the ER if you start to experience worsening pain, fever, chest pain or shortness of breath.

## 2021-06-11 LAB — URINE CULTURE

## 2021-07-15 ENCOUNTER — Encounter (HOSPITAL_COMMUNITY): Payer: Self-pay | Admitting: Emergency Medicine

## 2021-07-15 ENCOUNTER — Other Ambulatory Visit: Payer: Self-pay

## 2021-07-15 ENCOUNTER — Emergency Department (HOSPITAL_COMMUNITY)
Admission: EM | Admit: 2021-07-15 | Discharge: 2021-07-15 | Disposition: A | Discharge status: Home / Self Care | Payer: BC Managed Care – PPO | Attending: Emergency Medicine | Admitting: Emergency Medicine

## 2021-07-15 DIAGNOSIS — M25551 Pain in right hip: Secondary | ICD-10-CM | POA: Diagnosis not present

## 2021-07-15 DIAGNOSIS — M5441 Lumbago with sciatica, right side: Secondary | ICD-10-CM | POA: Diagnosis not present

## 2021-07-15 DIAGNOSIS — G8929 Other chronic pain: Secondary | ICD-10-CM | POA: Diagnosis not present

## 2021-07-15 DIAGNOSIS — M25552 Pain in left hip: Secondary | ICD-10-CM | POA: Diagnosis not present

## 2021-07-15 DIAGNOSIS — M545 Low back pain, unspecified: Secondary | ICD-10-CM | POA: Diagnosis present

## 2021-07-15 LAB — URINALYSIS, ROUTINE W REFLEX MICROSCOPIC
Bilirubin Urine: NEGATIVE
Glucose, UA: NEGATIVE mg/dL
Hgb urine dipstick: NEGATIVE
Ketones, ur: NEGATIVE mg/dL
Leukocytes,Ua: NEGATIVE
Nitrite: NEGATIVE
Protein, ur: NEGATIVE mg/dL
Specific Gravity, Urine: 1.025 (ref 1.005–1.030)
pH: 6 (ref 5.0–8.0)

## 2021-07-15 LAB — PREGNANCY, URINE: Preg Test, Ur: NEGATIVE

## 2021-07-15 MED ORDER — PREDNISONE 10 MG PO TABS: ORAL_TABLET | ORAL | 0 refills | Status: DC

## 2021-07-15 MED ORDER — HYDROCODONE-ACETAMINOPHEN 5-325 MG PO TABS
1.0000 | ORAL_TABLET | Freq: Once | ORAL | Status: AC
  Administered 2021-07-15: 1 via ORAL
  Filled 2021-07-15: qty 1

## 2021-07-15 MED ORDER — OXYCODONE-ACETAMINOPHEN 5-325 MG PO TABS: 1.0000 | ORAL_TABLET | Freq: Four times a day (QID) | ORAL | 0 refills | Status: DC | PRN

## 2021-07-15 MED ORDER — KETOROLAC TROMETHAMINE 60 MG/2ML IM SOLN
60.0000 mg | Freq: Once | INTRAMUSCULAR | Status: AC
  Administered 2021-07-15: 60 mg via INTRAMUSCULAR
  Filled 2021-07-15: qty 2

## 2021-07-15 NOTE — ED Triage Notes (Signed)
Pt c/o of back pain w a hx of chronic back pain. Pt states it became worse x2 days ago. Pt c/o pain that runs down the back of her legs.

## 2021-07-15 NOTE — Discharge Instructions (Addendum)
You may alternate ice and heat to your lower back.  Your urine test did not show evidence of infection.  I recommend that you follow-up with your primary care provider for recheck.  Return to the emergency department for any new or worsening symptoms.

## 2021-07-15 NOTE — ED Provider Notes (Signed)
[facility] EMERGENCY DEPARTMENT Provider Note   CSN: [id number] Arrival date & time: 07/15/21  1508     History Chief Complaint  Patient presents with   Back Pain    Amber Nolan is a 40 y.o. female.   Back Pain Associated symptoms: no abdominal pain, no chest pain, no dysuria, no fever, no headaches, no numbness and no weakness        Amber Nolan is a 40 y.o. female with past history significant for chronic low back pain secondary to scoliosis who presents to the Emergency Department complaining of gradually worsening pain x2 days.  She describes a burning, stinging and shooting pain from her lower back into both hips and buttocks.  She intermittently has stinging pains into both feet and mainly in her right leg.  Symptoms are worse with standing or walking, improve slightly with position change.  No known injury.  She has been taking Flexeril and diclofenac for her chronic low back pain, but medicines currently are not helping.  No known injury.  She denies any numbness or weakness of her lower extremities, fever, chills, dysuria, urine or bowel changes, and abdominal pain.  Past Medical History:  Diagnosis Date   Anxiety and depression    Gastritis    GERD (gastroesophageal reflux disease)    Migraine    Migraines    Scoliosis    Stomach ulcer     Patient Active Problem List   Diagnosis Date Noted   Migraine with aura 01/10/2014    Past Surgical History:  Procedure Laterality Date   APPENDECTOMY  1993   CHOLECYSTECTOMY  2014   EXPLORATORY LAPAROTOMY  2015   RHINOPLASTY     TUBAL LIGATION  2013     OB History   No obstetric history on file.     Family History  Problem Relation Age of Onset   Liver cancer Father        LIVER   Diabetes Other    Hypertension Other    Hypothyroidism Other    Stroke Other    Heart disease Other    Bladder Cancer Neg Hx    Prostate cancer Neg Hx    Kidney cancer Neg Hx     Social History    Tobacco Use   Smoking status: Never   Smokeless tobacco: Never  Vaping Use   Vaping Use: Never used  Substance Use Topics   Alcohol use: Yes    Comment: Social   Drug use: No    Home Medications Prior to Admission medications   Medication Sig Start Date End Date Taking? Authorizing Provider  amoxicillin-clavulanate (AUGMENTIN) 875-125 MG tablet Take 1 tablet by mouth 2 (two) times daily. One po bid x 7 days 03/21/21   Mancel Bale, MD  busPIRone (BUSPAR) 10 MG tablet Take 10 mg by mouth 2 (two) times daily.     [provider]  butalbital-acetaminophen-caffeine (FIORICET) 50-325-40 MG tablet Take 2 tablets by mouth every 4 (four) hours as needed for headache or migraine.  12/29/19   [provider]  cephALEXin (KEFLEX) 250 MG capsule Take 250 mg by mouth at bedtime. 12/29/19   [provider]  citalopram (CELEXA) 40 MG tablet Take 40 mg by mouth daily.    [provider]  cyclobenzaprine (FLEXERIL) 10 MG tablet Take 10 mg by mouth at bedtime. May take 3 times daily as needed 01/17/20   [provider]  diclofenac (VOLTAREN) 75 MG EC tablet Take 75 mg  by mouth 2 (two) times daily.    [provider]  HYDROcodone-acetaminophen (NORCO/VICODIN) 5-325 MG tablet Take 1 tablet by mouth every 6 (six) hours as needed. 02/17/20   Couture, Cortni S, PA-C  lisinopril-hydrochlorothiazide (ZESTORETIC) 20-25 MG tablet Take 1 tablet by mouth daily. 01/17/20   [provider]  nitrofurantoin, macrocrystal-monohydrate, (MACROBID) 100 MG capsule Take 1 capsule (100 mg total) by mouth 2 (two) times daily. 06/09/21   Khatri, Hina, PA-C  nortriptyline (PAMELOR) 10 MG capsule Take 30 mg by mouth at bedtime. 01/30/20   [provider]  ondansetron (ZOFRAN) 4 MG tablet Take 1 tablet (4 mg total) by mouth every 6 (six) hours. 02/16/20   Couture, Cortni S, PA-C  oxyCODONE-acetaminophen (PERCOCET) 5-325 MG tablet Take 1 tablet by mouth every 4 (four)  hours as needed for moderate pain or severe pain. 03/21/21   Mancel Bale, MD  oxyCODONE-acetaminophen (PERCOCET) 5-325 MG tablet Take 1 tablet by mouth every 4 (four) hours as needed for moderate pain (Prepack to go). 03/21/21   Mancel Bale, MD  potassium chloride (KLOR-CON) 10 MEQ tablet Take 1 tablet (10 mEq total) by mouth daily for 4 days. 02/17/20 02/21/20  Couture, Cortni S, PA-C    Allergies    Sulfa antibiotics  Review of Systems   Review of Systems  Constitutional:  Negative for fever.  Respiratory:  Negative for shortness of breath.   Cardiovascular:  Negative for chest pain.  Gastrointestinal:  Negative for abdominal pain, constipation, nausea and vomiting.  Genitourinary:  Negative for decreased urine volume, difficulty urinating, dysuria, flank pain, hematuria, vaginal bleeding and vaginal discharge.  Musculoskeletal:  Positive for back pain. Negative for joint swelling.  Skin:  Negative for rash.  Neurological:  Negative for dizziness, weakness, numbness and headaches.   Physical Exam Updated Vital Signs BP 122/80 (BP Location: Right Arm)   Pulse 85   Temp 98.5 F (36.9 C) (Oral)   Ht 5\' 4"  (1.626 m)   Wt 97.5 kg   SpO2 100%   BMI 36.90 kg/m   Physical Exam Vitals and nursing note reviewed.  Constitutional:      General: She is not in acute distress.    Appearance: Normal appearance. She is well-developed. She is not toxic-appearing.  HENT:     Head: Normocephalic.  Cardiovascular:     Rate and Rhythm: Normal rate and regular rhythm.     Pulses: Normal pulses.     Comments: DP pulses are strong and palpable bilaterally Pulmonary:     Effort: Pulmonary effort is normal. No respiratory distress.     Breath sounds: Normal breath sounds.  Abdominal:     General: There is no distension.     Palpations: Abdomen is soft.     Tenderness: There is no abdominal tenderness. There is no right CVA tenderness or left CVA tenderness.  Musculoskeletal:        General:  Tenderness present.     Cervical back: Normal range of motion.     Lumbar back: Tenderness present. No swelling, deformity or lacerations. Normal range of motion.     Right lower leg: No edema.     Left lower leg: No edema.     Comments: ttp of the lower lumber spine and bilateral SI joints.  Pt has 5/5 strength against resistance of bilateral lower extremities.     Skin:    General: Skin is warm.     Capillary Refill: Capillary refill takes less than 2 seconds.  Findings: No rash.  Neurological:     General: No focal deficit present.     Mental Status: She is alert and oriented to person, place, and time.     Sensory: Sensation is intact. No sensory deficit.     Motor: Motor function is intact. No weakness or abnormal muscle tone.     Coordination: Coordination normal.     Gait: Gait normal.     Deep Tendon Reflexes:     Reflex Scores:      Patellar reflexes are 2+ on the right side and 2+ on the left side.      Achilles reflexes are 2+ on the right side and 2+ on the left side.   ED Results / Procedures / Treatments   Labs (all labs ordered are listed, but only abnormal results are displayed) Labs Reviewed  URINALYSIS, ROUTINE W REFLEX MICROSCOPIC  PREGNANCY, URINE    EKG None  Radiology No results found.  Procedures Procedures   Medications Ordered in ED Medications  HYDROcodone-acetaminophen (NORCO/VICODIN) 5-325 MG per tablet 1 tablet (has no administration in time range)    ED Course  I have reviewed the triage vital signs and the nursing notes.  Pertinent labs & imaging results that were available during my care of the patient were reviewed by me and considered in my medical decision making (see chart for details).    MDM Rules/Calculators/A&P                           Patient here with likely acute on chronic low back pain.  Has history of scoliosis.  No known injury.  She describes having paresthesias into both buttocks and mostly down her right  leg.  She is ambulatory with a steady gait.  No focal neurodeficits on exam.  No red flag symptoms suggestive of cauda equina or recent procedure that may be suggestive of spinal abscess.  Urinalysis without evidence of infection and she is not currently pregnant.  Database reviewed.  I feel that she is appropriate for discharge home, she would likely benefit from a steroid taper and a short course of pain medication.  Has PCP and is agreeable to close outpatient follow-up.  Return precautions discussed.    Final Clinical Impression(s) / ED Diagnoses Final diagnoses:  Acute right-sided low back pain with right-sided sciatica    Rx / DC Orders ED Discharge Orders     None        Pauline Aus, PA-C 07/15/21 1826    Pollyann Savoy, MD 07/15/21 2324

## 2021-10-18 DIAGNOSIS — N200 Calculus of kidney: Secondary | ICD-10-CM | POA: Diagnosis not present

## 2021-11-18 DIAGNOSIS — H5213 Myopia, bilateral: Secondary | ICD-10-CM | POA: Diagnosis not present

## 2021-12-06 ENCOUNTER — Other Ambulatory Visit: Payer: Self-pay

## 2021-12-06 ENCOUNTER — Ambulatory Visit
Admission: RE | Admit: 2021-12-06 | Discharge: 2021-12-06 | Disposition: A | Discharge status: Home / Self Care | Payer: Medicaid Other | Source: Ambulatory Visit

## 2021-12-06 VITALS — BP 130/87 | HR 86 | Temp 99.3°F | Resp 18

## 2021-12-06 DIAGNOSIS — Z9189 Other specified personal risk factors, not elsewhere classified: Secondary | ICD-10-CM

## 2021-12-06 DIAGNOSIS — I10 Essential (primary) hypertension: Secondary | ICD-10-CM

## 2021-12-06 DIAGNOSIS — R52 Pain, unspecified: Secondary | ICD-10-CM

## 2021-12-06 DIAGNOSIS — R5381 Other malaise: Secondary | ICD-10-CM | POA: Diagnosis not present

## 2021-12-06 DIAGNOSIS — R0789 Other chest pain: Secondary | ICD-10-CM | POA: Diagnosis not present

## 2021-12-06 DIAGNOSIS — R052 Subacute cough: Secondary | ICD-10-CM

## 2021-12-06 DIAGNOSIS — R5383 Other fatigue: Secondary | ICD-10-CM

## 2021-12-06 DIAGNOSIS — B349 Viral infection, unspecified: Secondary | ICD-10-CM

## 2021-12-06 MED ORDER — IPRATROPIUM BROMIDE 0.03 % NA SOLN: 2.0000 | Freq: Two times a day (BID) | NASAL | 0 refills | Status: DC

## 2021-12-06 MED ORDER — CETIRIZINE HCL 10 MG PO TABS: 10.0000 mg | ORAL_TABLET | Freq: Every day | ORAL | 0 refills | Status: DC

## 2021-12-06 MED ORDER — BENZONATATE 100 MG PO CAPS: 100.0000 mg | ORAL_CAPSULE | Freq: Three times a day (TID) | ORAL | 0 refills | Status: DC | PRN

## 2021-12-06 MED ORDER — PSEUDOEPHEDRINE HCL 30 MG PO TABS: 30.0000 mg | ORAL_TABLET | Freq: Three times a day (TID) | ORAL | 0 refills | Status: DC | PRN

## 2021-12-06 MED ORDER — PROMETHAZINE-DM 6.25-15 MG/5ML PO SYRP: 5.0000 mL | ORAL_SOLUTION | Freq: Every evening | ORAL | 0 refills | Status: DC | PRN

## 2021-12-06 NOTE — ED Provider Notes (Signed)
[facility]   MRN: [id number] DOB: [birth year]  Subjective:   Amber Nolan is a 41 y.o. female presenting for 1 day history of sinus congestion, sinus headaches, body aches, throat pain, occasional coughing that elicits chest pain.  Has not had COVID-19 in the past 6 months.  No sick contacts to her knowledge including her kids.  No shortness of breath or wheezing, history of respiratory disorders.  No history of pulmonary embolism.  Patient is not a smoker.  No drug use.  No current facility-administered medications for this encounter.  Current Outpatient Medications:    venlafaxine (EFFEXOR) 75 MG tablet, Take 1 tablet by mouth 2 (two) times daily., Disp: , Rfl:    amoxicillin-clavulanate (AUGMENTIN) 875-125 MG tablet, Take 1 tablet by mouth 2 (two) times daily. One po bid x 7 days, Disp: 14 tablet, Rfl: 0   busPIRone (BUSPAR) 10 MG tablet, Take 10 mg by mouth 2 (two) times daily. , Disp: , Rfl:    butalbital-acetaminophen-caffeine (FIORICET) 50-325-40 MG tablet, Take 2 tablets by mouth every 4 (four) hours as needed for headache or migraine. , Disp: , Rfl:    cephALEXin (KEFLEX) 250 MG capsule, Take 250 mg by mouth at bedtime., Disp: , Rfl:    citalopram (CELEXA) 40 MG tablet, Take 40 mg by mouth daily., Disp: , Rfl:    cyclobenzaprine (FLEXERIL) 10 MG tablet, Take 10 mg by mouth at bedtime. May take 3 times daily as needed, Disp: , Rfl:    diclofenac (VOLTAREN) 75 MG EC tablet, Take 75 mg by mouth 2 (two) times daily., Disp: , Rfl:    HYDROcodone-acetaminophen (NORCO/VICODIN) 5-325 MG tablet, Take 1 tablet by mouth every 6 (six) hours as needed., Disp: 8 tablet, Rfl: 0   lisinopril-hydrochlorothiazide (ZESTORETIC) 20-25 MG tablet, Take 1 tablet by mouth daily., Disp: , Rfl:    nitrofurantoin, macrocrystal-monohydrate, (MACROBID) 100 MG capsule, Take 1 capsule (100 mg total) by mouth 2 (two) times daily., Disp: 10 capsule, Rfl: 0   nortriptyline (PAMELOR) 10 MG  capsule, Take 30 mg by mouth at bedtime., Disp: , Rfl:    ondansetron (ZOFRAN) 4 MG tablet, Take 1 tablet (4 mg total) by mouth every 6 (six) hours., Disp: 12 tablet, Rfl: 0   oxyCODONE-acetaminophen (PERCOCET) 5-325 MG tablet, Take 1 tablet by mouth every 4 (four) hours as needed for moderate pain or severe pain., Disp: 20 tablet, Rfl: 0   oxyCODONE-acetaminophen (PERCOCET) 5-325 MG tablet, Take 1 tablet by mouth every 4 (four) hours as needed for moderate pain (Prepack to go)., Disp: 6 tablet, Rfl: 0   oxyCODONE-acetaminophen (PERCOCET/ROXICET) 5-325 MG tablet, Take 1 tablet by mouth every 6 (six) hours as needed for severe pain., Disp: 10 tablet, Rfl: 0   potassium chloride (KLOR-CON) 10 MEQ tablet, Take 1 tablet (10 mEq total) by mouth daily for 4 days., Disp: 4 tablet, Rfl: 0   predniSONE (DELTASONE) 10 MG tablet, Take 6 tablets day one, 5 tablets day two, 4 tablets day three, 3 tablets day four, 2 tablets day five, then 1 tablet day six, Disp: 21 tablet, Rfl: 0   Allergies  Allergen Reactions   Sulfa Antibiotics Nausea And Vomiting    Past Medical History:  Diagnosis Date   Anxiety and depression    Gastritis    GERD (gastroesophageal reflux disease)    Migraine    Migraines    Scoliosis    Stomach ulcer      Past Surgical History:  Procedure Laterality Date  APPENDECTOMY  1993   CHOLECYSTECTOMY  2014   EXPLORATORY LAPAROTOMY  2015   RHINOPLASTY     TUBAL LIGATION  2013    Family History  Problem Relation Age of Onset   Liver cancer Father        LIVER   Diabetes Other    Hypertension Other    Hypothyroidism Other    Stroke Other    Heart disease Other    Bladder Cancer Neg Hx    Prostate cancer Neg Hx    Kidney cancer Neg Hx     Social History   Tobacco Use   Smoking status: Never   Smokeless tobacco: Never  Vaping Use   Vaping Use: Never used  Substance Use Topics   Alcohol use: Yes    Comment: Social   Drug use: No    ROS   Objective:    Vitals: BP 130/87 (BP Location: Right Arm)    Pulse 86    Temp 99.3 F (37.4 C) (Oral)    Resp 18    LMP  (Within Weeks) Comment: 2 weeks   SpO2 96%   Physical Exam Constitutional:      General: She is not in acute distress.    Appearance: Normal appearance. She is well-developed and normal weight. She is not ill-appearing, toxic-appearing or diaphoretic.  HENT:     Head: Normocephalic and atraumatic.     Right Ear: Tympanic membrane, ear canal and external ear normal. No drainage or tenderness. No middle ear effusion. There is no impacted cerumen. Tympanic membrane is not erythematous.     Left Ear: Tympanic membrane, ear canal and external ear normal. No drainage or tenderness.  No middle ear effusion. There is no impacted cerumen. Tympanic membrane is not erythematous.     Nose: Nose normal. No congestion or rhinorrhea.     Mouth/Throat:     Mouth: Mucous membranes are moist. No oral lesions.     Pharynx: No pharyngeal swelling, oropharyngeal exudate, posterior oropharyngeal erythema or uvula swelling.     Tonsils: No tonsillar exudate or tonsillar abscesses.  Eyes:     General: No scleral icterus.       Right eye: No discharge.        Left eye: No discharge.     Extraocular Movements: Extraocular movements intact.     Right eye: Normal extraocular motion.     Left eye: Normal extraocular motion.     Conjunctiva/sclera: Conjunctivae normal.  Cardiovascular:     Rate and Rhythm: Normal rate.     Heart sounds: No murmur heard.   No friction rub. No gallop.  Pulmonary:     Effort: Pulmonary effort is normal. No respiratory distress.     Breath sounds: No stridor. No wheezing, rhonchi or rales.  Chest:     Chest wall: No tenderness.  Musculoskeletal:     Cervical back: Normal range of motion and neck supple.  Lymphadenopathy:     Cervical: No cervical adenopathy.  Skin:    General: Skin is warm and dry.  Neurological:     General: No focal deficit present.     Mental  Status: She is alert and oriented to person, place, and time.  Psychiatric:        Mood and Affect: Mood normal.        Behavior: Behavior normal.    Assessment and Plan :   PDMP not reviewed this encounter.  1. Acute viral syndrome   2. At increased  risk of exposure to COVID-19 virus   3. Body aches   4. Malaise and fatigue   5. Subacute cough   6. Atypical chest pain   7. Essential hypertension     Deferred imaging given clear cardiopulmonary exam, hemodynamically stable vital signs.  Low suspicion for an acute cardiopulmonary emergency. Does not meet [facility] criteria for strep testing.  Will manage for viral illness such as viral URI, viral syndrome, viral rhinitis, COVID-19. Recommended supportive care. Offered scripts for symptomatic relief. Testing is pending. Counseled patient on potential for adverse effects with medications prescribed/recommended today, ER and return-to-clinic precautions discussed, patient verbalized understanding.     Wallis BambergMani, Odeal Welden, [place] JerseyPA-C 12/06/21 1646

## 2021-12-06 NOTE — ED Triage Notes (Signed)
Pt reports headache, body aches, congestion x 1 day.

## 2021-12-06 NOTE — Discharge Instructions (Addendum)
We will notify you of your test results as they arrive and may take between 48-72 hours.  I encourage you to sign up for MyChart if you have not already done so as this can be the easiest way for [place] to communicate results to you online or through a phone app.  Generally, we only contact you if it is a positive test result.  In the meantime, if you develop worsening symptoms including fever, chest pain, shortness of breath despite our current treatment plan then please report to the emergency room as this may be a sign of worsening status from possible viral infection.  Otherwise, we will manage this as a viral syndrome. For sore throat or cough try using a honey-based tea. Use 3 teaspoons of honey with juice squeezed from half lemon. Place shaved pieces of ginger into 1/2-1 cup of water and warm over stove top. Then mix the ingredients and repeat every 4 hours as needed. Please take Tylenol 500mg -650mg  every 6 hours for aches and pains, fevers. Hydrate very well with at least 2 liters of water. Eat light meals such as soups to replenish electrolytes and soft fruits, veggies. Start an antihistamine like Zyrtec for postnasal drainage, sinus congestion.  You can take this together with pseudoephedrine (Sudafed) at a dose of 30 mg 2-3 times a day as needed for the same kind of congestion.  Use cough suppression medications as needed.

## 2021-12-07 LAB — COVID-19, FLU A+B NAA
Influenza A, NAA: NOT DETECTED
Influenza B, NAA: NOT DETECTED
SARS-CoV-2, NAA: NOT DETECTED

## 2021-12-20 DIAGNOSIS — Z0000 Encounter for general adult medical examination without abnormal findings: Secondary | ICD-10-CM | POA: Diagnosis not present

## 2021-12-20 DIAGNOSIS — R0683 Snoring: Secondary | ICD-10-CM | POA: Diagnosis not present

## 2021-12-20 DIAGNOSIS — F418 Other specified anxiety disorders: Secondary | ICD-10-CM | POA: Diagnosis not present

## 2021-12-20 DIAGNOSIS — Z0130 Encounter for examination of blood pressure without abnormal findings: Secondary | ICD-10-CM | POA: Diagnosis not present

## 2021-12-20 DIAGNOSIS — R5383 Other fatigue: Secondary | ICD-10-CM | POA: Diagnosis not present

## 2022-01-15 ENCOUNTER — Other Ambulatory Visit: Payer: Self-pay

## 2022-01-15 ENCOUNTER — Ambulatory Visit
Admission: RE | Admit: 2022-01-15 | Discharge: 2022-01-15 | Disposition: A | Discharge status: Home / Self Care | Payer: Medicaid Other | Source: Ambulatory Visit | Attending: Family Medicine | Admitting: Family Medicine

## 2022-01-15 VITALS — BP 114/78 | HR 83 | Temp 98.5°F | Resp 18

## 2022-01-15 DIAGNOSIS — J22 Unspecified acute lower respiratory infection: Secondary | ICD-10-CM | POA: Diagnosis not present

## 2022-01-15 DIAGNOSIS — R0602 Shortness of breath: Secondary | ICD-10-CM

## 2022-01-15 DIAGNOSIS — J3089 Other allergic rhinitis: Secondary | ICD-10-CM | POA: Diagnosis not present

## 2022-01-15 MED ORDER — BENZONATATE 100 MG PO CAPS: 100.0000 mg | ORAL_CAPSULE | Freq: Three times a day (TID) | ORAL | 0 refills | Status: DC | PRN

## 2022-01-15 MED ORDER — PREDNISONE 20 MG PO TABS: 40.0000 mg | ORAL_TABLET | Freq: Every day | ORAL | 0 refills | Status: DC

## 2022-01-15 MED ORDER — AZITHROMYCIN 250 MG PO TABS: ORAL_TABLET | ORAL | 0 refills | Status: DC

## 2022-01-15 MED ORDER — PROMETHAZINE-DM 6.25-15 MG/5ML PO SYRP: 5.0000 mL | ORAL_SOLUTION | Freq: Four times a day (QID) | ORAL | 0 refills | Status: DC | PRN

## 2022-01-15 NOTE — ED Provider Notes (Signed)
[facility]    CSN: [id number] Arrival date & time: 01/15/22  1349      History   Chief Complaint Chief Complaint  Patient presents with   Cough    Congestion and cough   HPI Amber Nolan is a 41 y.o. female.   Presenting today with a month and a half of ongoing congestion, postnasal drip, headache, cough now with worsening cough, shortness of breath, chest tightness at times particular with exertion.  She denies fever, chills, chest pain, abdominal pain, nausea vomiting or diarrhea.  Was seen on 12/06/2021 and given numerous medications such as Sudafed, antihistamines, cough syrup and Tessalon Perles which she states helped somewhat but have not resolved symptoms.  She is taking antihistamines daily for seasonal allergies.  No new sick contacts recently.  Past Medical History:  Diagnosis Date   Anxiety and depression    Gastritis    GERD (gastroesophageal reflux disease)    Migraine    Migraines    Scoliosis    Stomach ulcer     Patient Active Problem List   Diagnosis Date Noted   Migraine with aura 01/10/2014    Past Surgical History:  Procedure Laterality Date   APPENDECTOMY  1993   CHOLECYSTECTOMY  2014   EXPLORATORY LAPAROTOMY  2015   RHINOPLASTY     TUBAL LIGATION  2013    OB History   No obstetric history on file.      Home Medications    Prior to Admission medications   Medication Sig Start Date End Date Taking? Authorizing Provider  azithromycin (ZITHROMAX) 250 MG tablet Take first 2 tablets together, then 1 every day until finished. 01/15/22  Yes Particia NearingLane, Chadrick Sprinkle Elizabeth, PA-C  predniSONE (DELTASONE) 20 MG tablet Take 2 tablets (40 mg total) by mouth daily with breakfast. 01/15/22  Yes Particia NearingLane, Jessenia Filippone Elizabeth, PA-C  promethazine-dextromethorphan (PROMETHAZINE-DM) 6.25-15 MG/5ML syrup Take 5 mLs by mouth 4 (four) times daily as needed. 01/15/22  Yes Particia NearingLane, Yetta Marceaux Elizabeth, PA-C  amoxicillin-clavulanate (AUGMENTIN) 875-125 MG tablet Take  1 tablet by mouth 2 (two) times daily. One po bid x 7 days 03/21/21   Mancel BaleWentz, Elliott, MD  benzonatate (TESSALON) 100 MG capsule Take 1-2 capsules (100-200 mg total) by mouth 3 (three) times daily as needed for cough. 01/15/22   Particia NearingLane, Margarete Horace Elizabeth, PA-C  busPIRone (BUSPAR) 10 MG tablet Take 10 mg by mouth 2 (two) times daily.     [provider]  butalbital-acetaminophen-caffeine (FIORICET) 50-325-40 MG tablet Take 2 tablets by mouth every 4 (four) hours as needed for headache or migraine.  12/29/19   [provider]  cephALEXin (KEFLEX) 250 MG capsule Take 250 mg by mouth at bedtime. 12/29/19   [provider]  cetirizine (ZYRTEC ALLERGY) 10 MG tablet Take 1 tablet (10 mg total) by mouth daily. 12/06/21   Wallis BambergMani, Mario, PA-C  citalopram (CELEXA) 40 MG tablet Take 40 mg by mouth daily.    [provider]  cyclobenzaprine (FLEXERIL) 10 MG tablet Take 10 mg by mouth at bedtime. May take 3 times daily as needed 01/17/20   [provider]  diclofenac (VOLTAREN) 75 MG EC tablet Take 75 mg by mouth 2 (two) times daily.    [provider]  HYDROcodone-acetaminophen (NORCO/VICODIN) 5-325 MG tablet Take 1 tablet by mouth every 6 (six) hours as needed. 02/17/20   Couture, Cortni S, PA-C  ipratropium (ATROVENT) 0.03 % nasal spray Place 2 sprays into both nostrils 2 (two) times daily. 12/06/21  Wallis Bamberg, PA-C  lisinopril-hydrochlorothiazide (ZESTORETIC) 20-25 MG tablet Take 1 tablet by mouth daily. 01/17/20   [provider]  nitrofurantoin, macrocrystal-monohydrate, (MACROBID) 100 MG capsule Take 1 capsule (100 mg total) by mouth 2 (two) times daily. 06/09/21   Khatri, Hina, PA-C  nortriptyline (PAMELOR) 10 MG capsule Take 30 mg by mouth at bedtime. 01/30/20   [provider]  ondansetron (ZOFRAN) 4 MG tablet Take 1 tablet (4 mg total) by mouth every 6 (six) hours. 02/16/20   Couture, Cortni S, PA-C  oxyCODONE-acetaminophen (PERCOCET) 5-325 MG tablet  Take 1 tablet by mouth every 4 (four) hours as needed for moderate pain or severe pain. 03/21/21   Mancel Bale, MD  oxyCODONE-acetaminophen (PERCOCET) 5-325 MG tablet Take 1 tablet by mouth every 4 (four) hours as needed for moderate pain (Prepack to go). 03/21/21   Mancel Bale, MD  oxyCODONE-acetaminophen (PERCOCET/ROXICET) 5-325 MG tablet Take 1 tablet by mouth every 6 (six) hours as needed for severe pain. 07/15/21   Triplett, Tammy, PA-C  potassium chloride (KLOR-CON) 10 MEQ tablet Take 1 tablet (10 mEq total) by mouth daily for 4 days. 02/17/20 02/21/20  Couture, Cortni S, PA-C  predniSONE (DELTASONE) 10 MG tablet Take 6 tablets day one, 5 tablets day two, 4 tablets day three, 3 tablets day four, 2 tablets day five, then 1 tablet day six 07/15/21   Triplett, Tammy, PA-C  promethazine-dextromethorphan (PROMETHAZINE-DM) 6.25-15 MG/5ML syrup Take 5 mLs by mouth at bedtime as needed for cough. 12/06/21   Wallis Bamberg, PA-C  pseudoephedrine (SUDAFED) 30 MG tablet Take 1 tablet (30 mg total) by mouth every 8 (eight) hours as needed for congestion. 12/06/21   Wallis Bamberg, PA-C  venlafaxine (EFFEXOR) 75 MG tablet Take 1 tablet by mouth 2 (two) times daily. 09/06/21   [provider]    Family History Family History  Problem Relation Age of Onset   Liver cancer Father        LIVER   Diabetes Other    Hypertension Other    Hypothyroidism Other    Stroke Other    Heart disease Other    Bladder Cancer Neg Hx    Prostate cancer Neg Hx    Kidney cancer Neg Hx     Social History Social History   Tobacco Use   Smoking status: Never   Smokeless tobacco: Never  Vaping Use   Vaping Use: Never used  Substance Use Topics   Alcohol use: Yes    Comment: Social   Drug use: No     Allergies   Sulfa antibiotics   Review of Systems Review of Systems Per HPI  Physical Exam Triage Vital Signs ED Triage Vitals  Enc Vitals Group     BP 01/15/22 1427 114/78     Pulse Rate 01/15/22 1427  83     Resp 01/15/22 1427 18     Temp 01/15/22 1427 98.5 F (36.9 C)     Temp Source 01/15/22 1427 Oral     SpO2 01/15/22 1427 96 %     Weight --      Height --      Head Circumference --      Peak Flow --      Pain Score 01/15/22 1424 4     Pain Loc --      Pain Edu? --      Excl. in GC? --    No data found.  Updated Vital Signs BP 114/78 (BP Location: Right Arm)  Pulse 83    Temp 98.5 F (36.9 C) (Oral)    Resp 18    LMP 01/15/2022 (Exact Date)    SpO2 96%   Visual Acuity Right Eye Distance:   Left Eye Distance:   Bilateral Distance:    Right Eye Near:   Left Eye Near:    Bilateral Near:     Physical Exam Vitals and nursing note reviewed.  Constitutional:      Appearance: Normal appearance.  HENT:     Head: Atraumatic.     Right Ear: Tympanic membrane and external ear normal.     Left Ear: Tympanic membrane and external ear normal.     Nose: Rhinorrhea present.     Mouth/Throat:     Mouth: Mucous membranes are moist.     Pharynx: Posterior oropharyngeal erythema present.  Eyes:     Extraocular Movements: Extraocular movements intact.     Conjunctiva/sclera: Conjunctivae normal.  Cardiovascular:     Rate and Rhythm: Normal rate and regular rhythm.     Heart sounds: Normal heart sounds.  Pulmonary:     Effort: Pulmonary effort is normal.     Breath sounds: Normal breath sounds. No wheezing or rales.  Musculoskeletal:        General: Normal range of motion.     Cervical back: Normal range of motion and neck supple.  Skin:    General: Skin is warm and dry.  Neurological:     Mental Status: She is alert and oriented to person, place, and time.     Motor: No weakness.     Gait: Gait normal.  Psychiatric:        Mood and Affect: Mood normal.        Thought Content: Thought content normal.     UC Treatments / Results  Labs (all labs ordered are listed, but only abnormal results are displayed) Labs Reviewed - No data to display  EKG   Radiology No  results found.  Procedures Procedures (including critical care time)  Medications Ordered in UC Medications - No data to display  Initial Impression / Assessment and Plan / UC Course  I have reviewed the triage vital signs and the nursing notes.  Pertinent labs & imaging results that were available during my care of the patient were reviewed by me and considered in my medical decision making (see chart for details).     Given duration, worsening course of symptoms will treat for a bronchitis and respiratory infection with azithromycin, prednisone, Phenergan DM, Tessalon Perles, supportive medications.  Continue allergy regimen, home care.  Return for any acutely worsening symptoms.  Final Clinical Impressions(s) / UC Diagnoses   Final diagnoses:  Lower respiratory infection  SOB (shortness of breath)  Seasonal allergic rhinitis due to other allergic trigger   Discharge Instructions   None    ED Prescriptions     Medication Sig Dispense Auth. Provider   azithromycin (ZITHROMAX) 250 MG tablet Take first 2 tablets together, then 1 every day until finished. 6 tablet Particia Nearing, PA-C   benzonatate (TESSALON) 100 MG capsule Take 1-2 capsules (100-200 mg total) by mouth 3 (three) times daily as needed for cough. 60 capsule Particia Nearing, [place]   predniSONE (DELTASONE) 20 MG tablet Take 2 tablets (40 mg total) by mouth daily with breakfast. 10 tablet Particia Nearing, PA-C   promethazine-dextromethorphan (PROMETHAZINE-DM) 6.25-15 MG/5ML syrup Take 5 mLs by mouth 4 (four) times daily as needed. 100 mL Amber Maser  Lanora Manis, PA-C      PDMP not reviewed this encounter.   Particia Nearing, [place] 01/15/22 [phone]

## 2022-01-15 NOTE — ED Triage Notes (Signed)
Pt states she has still having congestion after her visit here on 12/06/2021.  ? ?Pt states she has completed her meds that were given to her but she is not able to take the Sudafed that was prescribed. ? ?Pt states she is having difficulty breathing and coughing is making her throat hurt ? ?Pt states she also has a headache ? ?Denies Fever ? ?

## 2022-01-23 ENCOUNTER — Ambulatory Visit
Admission: EM | Admit: 2022-01-23 | Discharge: 2022-01-23 | Disposition: A | Discharge status: Home / Self Care | Payer: Medicaid Other | Attending: Family Medicine | Admitting: Family Medicine

## 2022-01-23 ENCOUNTER — Other Ambulatory Visit: Payer: Self-pay

## 2022-01-23 DIAGNOSIS — G43019 Migraine without aura, intractable, without status migrainosus: Secondary | ICD-10-CM | POA: Diagnosis not present

## 2022-01-23 MED ORDER — ONDANSETRON 4 MG PO TBDP: 4.0000 mg | ORAL_TABLET | Freq: Three times a day (TID) | ORAL | 0 refills | Status: DC | PRN

## 2022-01-23 MED ORDER — KETOROLAC TROMETHAMINE 30 MG/ML IJ SOLN
30.0000 mg | Freq: Once | INTRAMUSCULAR | Status: AC
  Administered 2022-01-23: 30 mg via INTRAMUSCULAR

## 2022-01-23 MED ORDER — DEXAMETHASONE SODIUM PHOSPHATE 10 MG/ML IJ SOLN
10.0000 mg | Freq: Once | INTRAMUSCULAR | Status: AC
  Administered 2022-01-23: 10 mg via INTRAMUSCULAR

## 2022-01-23 NOTE — ED Provider Notes (Signed)
?[facility] ? ? ? ?CSN: [id number] ?Arrival date & time: 01/23/22  1550 ? ? ?  ? ?History   ?Chief Complaint ?Chief Complaint  ?Patient presents with  ? Migraine  ? ? ?HPI ?Amber Nolan is a 41 y.o. female.  ? ?Presenting today with 4-day history of significant left-sided migraine, nausea, photophobia.  States has been taking Imitrex, Tylenol, ibuprofen which subside symptoms mildly for short period of time but symptoms continue to persist.  Denies head injury, dizziness, altered mental status, gait change, weakness, numbness, tingling.  History of migraines, states she has been placed on Effexor from her neurologist which does seem to reduce the frequency of her flareups. ? ? ?Past Medical History:  ?Diagnosis Date  ? Anxiety and depression   ? Gastritis   ? GERD (gastroesophageal reflux disease)   ? Migraine   ? Migraines   ? Scoliosis   ? Stomach ulcer   ? ? ?Patient Active Problem List  ? Diagnosis Date Noted  ? Migraine with aura 01/10/2014  ? ? ?Past Surgical History:  ?Procedure Laterality Date  ? APPENDECTOMY  1993  ? CHOLECYSTECTOMY  2014  ? EXPLORATORY LAPAROTOMY  2015  ? RHINOPLASTY    ? TUBAL LIGATION  2013  ? ? ?OB History   ?No obstetric history on file. ?  ? ? ? ?Home Medications   ? ?Prior to Admission medications   ?Medication Sig Start Date End Date Taking? Authorizing Provider  ?ondansetron (ZOFRAN-ODT) 4 MG disintegrating tablet Take 1 tablet (4 mg total) by mouth every 8 (eight) hours as needed for nausea or vomiting. 01/23/22  Yes Particia Nearing, PA-C  ?amoxicillin-clavulanate (AUGMENTIN) 875-125 MG tablet Take 1 tablet by mouth 2 (two) times daily. One po bid x 7 days 03/21/21   Mancel Bale, MD  ?azithromycin (ZITHROMAX) 250 MG tablet Take first 2 tablets together, then 1 every day until finished. 01/15/22   Particia Nearing, PA-C  ?benzonatate (TESSALON) 100 MG capsule Take 1-2 capsules (100-200 mg total) by mouth 3 (three) times daily as needed for cough.  01/15/22   Particia Nearing, PA-C  ?busPIRone (BUSPAR) 10 MG tablet Take 10 mg by mouth 2 (two) times daily.     [provider]  ?butalbital-acetaminophen-caffeine (FIORICET) 50-325-40 MG tablet Take 2 tablets by mouth every 4 (four) hours as needed for headache or migraine.  12/29/19   [provider]  ?cephALEXin (KEFLEX) 250 MG capsule Take 250 mg by mouth at bedtime. 12/29/19   [provider]  ?cetirizine (ZYRTEC ALLERGY) 10 MG tablet Take 1 tablet (10 mg total) by mouth daily. 12/06/21   Wallis Bamberg, PA-C  ?citalopram (CELEXA) 40 MG tablet Take 40 mg by mouth daily.    [provider]  ?cyclobenzaprine (FLEXERIL) 10 MG tablet Take 10 mg by mouth at bedtime. May take 3 times daily as needed 01/17/20   [provider]  ?diclofenac (VOLTAREN) 75 MG EC tablet Take 75 mg by mouth 2 (two) times daily.    [provider]  ?HYDROcodone-acetaminophen (NORCO/VICODIN) 5-325 MG tablet Take 1 tablet by mouth every 6 (six) hours as needed. 02/17/20   Couture, Cortni S, PA-C  ?ipratropium (ATROVENT) 0.03 % nasal spray Place 2 sprays into both nostrils 2 (two) times daily. 12/06/21   Wallis Bamberg, PA-C  ?lisinopril-hydrochlorothiazide (ZESTORETIC) 20-25 MG tablet Take 1 tablet by mouth daily. 01/17/20   [provider]  ?nitrofurantoin, macrocrystal-monohydrate, (MACROBID) 100 MG capsule Take 1 capsule (100 mg total)  by mouth 2 (two) times daily. 06/09/21   Khatri, Hina, PA-C  ?nortriptyline (PAMELOR) 10 MG capsule Take 30 mg by mouth at bedtime. 01/30/20   [provider]  ?ondansetron (ZOFRAN) 4 MG tablet Take 1 tablet (4 mg total) by mouth every 6 (six) hours. 02/16/20   Couture, Cortni S, PA-C  ?oxyCODONE-acetaminophen (PERCOCET) 5-325 MG tablet Take 1 tablet by mouth every 4 (four) hours as needed for moderate pain or severe pain. 03/21/21   Mancel BaleWentz, Elliott, MD  ?oxyCODONE-acetaminophen (PERCOCET) 5-325 MG tablet Take 1 tablet by mouth every 4 (four) hours as  needed for moderate pain (Prepack to go). 03/21/21   Mancel BaleWentz, Elliott, MD  ?oxyCODONE-acetaminophen (PERCOCET/ROXICET) 5-325 MG tablet Take 1 tablet by mouth every 6 (six) hours as needed for severe pain. 07/15/21   Triplett, Tammy, PA-C  ?potassium chloride (KLOR-CON) 10 MEQ tablet Take 1 tablet (10 mEq total) by mouth daily for 4 days. 02/17/20 02/21/20  Couture, Cortni S, PA-C  ?predniSONE (DELTASONE) 10 MG tablet Take 6 tablets day one, 5 tablets day two, 4 tablets day three, 3 tablets day four, 2 tablets day five, then 1 tablet day six 07/15/21   Triplett, Tammy, PA-C  ?predniSONE (DELTASONE) 20 MG tablet Take 2 tablets (40 mg total) by mouth daily with breakfast. 01/15/22   Particia NearingLane, Jacai Kipp Elizabeth, PA-C  ?promethazine-dextromethorphan (PROMETHAZINE-DM) 6.25-15 MG/5ML syrup Take 5 mLs by mouth at bedtime as needed for cough. 12/06/21   Wallis BambergMani, Mario, PA-C  ?promethazine-dextromethorphan (PROMETHAZINE-DM) 6.25-15 MG/5ML syrup Take 5 mLs by mouth 4 (four) times daily as needed. 01/15/22   Particia NearingLane, Betzabe Bevans Elizabeth, PA-C  ?pseudoephedrine (SUDAFED) 30 MG tablet Take 1 tablet (30 mg total) by mouth every 8 (eight) hours as needed for congestion. 12/06/21   Wallis BambergMani, Mario, PA-C  ?venlafaxine (EFFEXOR) 75 MG tablet Take 1 tablet by mouth 2 (two) times daily. 09/06/21   [provider]  ? ? ?Family History ?Family History  ?Problem Relation Age of Onset  ? Liver cancer Father   ?     LIVER  ? Diabetes Other   ? Hypertension Other   ? Hypothyroidism Other   ? Stroke Other   ? Heart disease Other   ? Bladder Cancer Neg Hx   ? Prostate cancer Neg Hx   ? Kidney cancer Neg Hx   ? ? ?Social History ?Social History  ? ?Tobacco Use  ? Smoking status: Never  ? Smokeless tobacco: Never  ?Vaping Use  ? Vaping Use: Never used  ?Substance Use Topics  ? Alcohol use: Yes  ?  Comment: Social  ? Drug use: No  ? ? ? ?Allergies   ?Sulfa antibiotics ? ? ?Review of Systems ?Review of Systems ?Per HPI ? ?Physical Exam ?Triage Vital Signs ?ED Triage Vitals   ?Enc Vitals Group  ?   BP 01/23/22 1601 110/77  ?   Pulse Rate 01/23/22 1601 84  ?   Resp 01/23/22 1601 18  ?   Temp 01/23/22 1601 98.4 ?F (36.9 ?C)  ?   Temp Source 01/23/22 1601 Oral  ?   SpO2 01/23/22 1601 96 %  ?   Weight --   ?   Height --   ?   Head Circumference --   ?   Peak Flow --   ?   Pain Score 01/23/22 1556 6  ?   Pain Loc --   ?   Pain Edu? --   ?   Excl. in GC? --   ? ?  No data found. ? ?Updated Vital Signs ?BP 110/77 (BP Location: Right Arm)   Pulse 84   Temp 98.4 ?F (36.9 ?C) (Oral)   Resp 18   LMP 01/15/2022 (Exact Date)   SpO2 96%  ? ?Visual Acuity ?Right Eye Distance:   ?Left Eye Distance:   ?Bilateral Distance:   ? ?Right Eye Near:   ?Left Eye Near:    ?Bilateral Near:    ? ?Physical Exam ?Vitals and nursing note reviewed.  ?Constitutional:   ?   Appearance: Normal appearance. She is not ill-appearing.  ?HENT:  ?   Head: Atraumatic.  ?Eyes:  ?   Extraocular Movements: Extraocular movements intact.  ?   Conjunctiva/sclera: Conjunctivae normal.  ?   Pupils: Pupils are equal, round, and reactive to light.  ?Cardiovascular:  ?   Rate and Rhythm: Normal rate and regular rhythm.  ?   Heart sounds: Normal heart sounds.  ?Pulmonary:  ?   Effort: Pulmonary effort is normal.  ?   Breath sounds: Normal breath sounds.  ?Musculoskeletal:     ?   General: Normal range of motion.  ?   Cervical back: Normal range of motion and neck supple.  ?Skin: ?   General: Skin is warm and dry.  ?Neurological:  ?   General: No focal deficit present.  ?   Mental Status: She is alert and oriented to person, place, and time.  ?   Cranial Nerves: No cranial nerve deficit.  ?   Motor: No weakness.  ?   Gait: Gait normal.  ?Psychiatric:     ?   Mood and Affect: Mood normal.     ?   Thought Content: Thought content normal.     ?   Judgment: Judgment normal.  ? ? ? ?UC Treatments / Results  ?Labs ?(all labs ordered are listed, but only abnormal results are displayed) ?Labs Reviewed - No data to  display ? ?EKG ? ? ?Radiology ?No results found. ? ?Procedures ?Procedures (including critical care time) ? ?Medications Ordered in UC ?Medications  ?ketorolac (TORADOL) 30 MG/ML injection 30 mg (30 mg Intramuscular Given 01/23/22 1628)

## 2022-01-23 NOTE — ED Triage Notes (Signed)
Pt states she has had a headache since Sunday ? ?Pt states she is also feeling nauseas ? ?Pt states she tried Imitrix, tylenol and Ibuprofen ? ?Pt stated she is out of her prescribed meds for her migraines  ?

## 2022-01-28 DIAGNOSIS — Z0000 Encounter for general adult medical examination without abnormal findings: Secondary | ICD-10-CM | POA: Diagnosis not present

## 2022-01-28 DIAGNOSIS — M545 Low back pain, unspecified: Secondary | ICD-10-CM | POA: Diagnosis not present

## 2022-01-28 DIAGNOSIS — R0683 Snoring: Secondary | ICD-10-CM | POA: Diagnosis not present

## 2022-01-28 DIAGNOSIS — F418 Other specified anxiety disorders: Secondary | ICD-10-CM | POA: Diagnosis not present

## 2022-01-28 DIAGNOSIS — Z0130 Encounter for examination of blood pressure without abnormal findings: Secondary | ICD-10-CM | POA: Diagnosis not present

## 2022-01-28 DIAGNOSIS — E669 Obesity, unspecified: Secondary | ICD-10-CM | POA: Diagnosis not present

## 2022-01-28 DIAGNOSIS — R5383 Other fatigue: Secondary | ICD-10-CM | POA: Diagnosis not present

## 2022-02-19 ENCOUNTER — Encounter (HOSPITAL_COMMUNITY): Payer: Self-pay | Admitting: *Deleted

## 2022-02-19 ENCOUNTER — Other Ambulatory Visit: Payer: Self-pay

## 2022-02-19 DIAGNOSIS — R1084 Generalized abdominal pain: Secondary | ICD-10-CM | POA: Insufficient documentation

## 2022-02-19 DIAGNOSIS — R197 Diarrhea, unspecified: Secondary | ICD-10-CM | POA: Diagnosis not present

## 2022-02-19 DIAGNOSIS — Z5321 Procedure and treatment not carried out due to patient leaving prior to being seen by health care provider: Secondary | ICD-10-CM | POA: Diagnosis not present

## 2022-02-19 DIAGNOSIS — R112 Nausea with vomiting, unspecified: Secondary | ICD-10-CM | POA: Diagnosis not present

## 2022-02-19 NOTE — ED Triage Notes (Signed)
Pt c/o n/v/d with generalized body aches that started yesterday ?

## 2022-02-20 ENCOUNTER — Emergency Department (HOSPITAL_COMMUNITY)
Admission: EM | Admit: 2022-02-20 | Discharge: 2022-02-20 | Disposition: A | Discharge status: Home / Self Care | Payer: Medicaid Other | Attending: Emergency Medicine | Admitting: Emergency Medicine

## 2022-02-20 ENCOUNTER — Ambulatory Visit
Admission: RE | Admit: 2022-02-20 | Discharge: 2022-02-20 | Disposition: A | Discharge status: Home / Self Care | Payer: Medicaid Other | Source: Ambulatory Visit

## 2022-02-20 VITALS — BP 114/78 | HR 115 | Temp 98.1°F | Resp 18

## 2022-02-20 DIAGNOSIS — R197 Diarrhea, unspecified: Secondary | ICD-10-CM

## 2022-02-20 DIAGNOSIS — R112 Nausea with vomiting, unspecified: Secondary | ICD-10-CM

## 2022-02-20 DIAGNOSIS — R519 Headache, unspecified: Secondary | ICD-10-CM | POA: Diagnosis not present

## 2022-02-20 DIAGNOSIS — R000 Tachycardia, unspecified: Secondary | ICD-10-CM

## 2022-02-20 DIAGNOSIS — R52 Pain, unspecified: Secondary | ICD-10-CM

## 2022-02-20 MED ORDER — ONDANSETRON 4 MG PO TBDP
4.0000 mg | ORAL_TABLET | Freq: Once | ORAL | Status: AC
  Administered 2022-02-20: 4 mg via ORAL

## 2022-02-20 MED ORDER — ONDANSETRON 4 MG PO TBDP: 4.0000 mg | ORAL_TABLET | Freq: Three times a day (TID) | ORAL | 0 refills | Status: DC | PRN

## 2022-02-20 NOTE — ED Triage Notes (Signed)
Pt states that since Monday morning she has been nauseas and vomiting all day and night ? ?Pt states she vomited a few ties yesterday but it has stopped ? ?Pt states she is having diarrhea ? ?Pt tats the back of her neck is hurting and her head is hurting ? ?Pt states she is unable to keep her regular meds because of vomiting ? ?Pt states her chest feels like something is on it and her body is achy ? ?Pt states she started Vitoza about 2 weeks ago ?

## 2022-02-20 NOTE — ED Provider Notes (Signed)
?[facility] ? ? ? ?CSN: [id number] ?Arrival date & time: 02/20/22  1151 ? ? ?  ? ?History   ?Chief Complaint ?Chief Complaint  ?Patient presents with  ? Diarrhea  ?  Vomiting & diarrhea Monday - Tuesday. Now bad headache, body hurts all over and feel exhausted. - Entered by patient  ? ? ?HPI ?Amber Nolan is a 41 y.o. female.  ? ?Presenting today with 2 to 3-day history of nausea, vomiting, diarrhea.  She states that after about a day of this she started having headaches, body aches that have felt like previous episodes of dehydration that she is experienced.  She also states she was unable to tolerate her Effexor or other medications for the past 2 days to think she may be having some medication withdrawal symptoms.  Denies fever, chills, cough, congestion, sore throat, abdominal pain.  Has not been trying any medications for symptoms. ? ? ?Past Medical History:  ?Diagnosis Date  ? Anxiety and depression   ? Gastritis   ? GERD (gastroesophageal reflux disease)   ? Migraine   ? Migraines   ? Scoliosis   ? Stomach ulcer   ? ? ?Patient Active Problem List  ? Diagnosis Date Noted  ? Migraine with aura 01/10/2014  ? ? ?Past Surgical History:  ?Procedure Laterality Date  ? APPENDECTOMY  1993  ? CHOLECYSTECTOMY  2014  ? EXPLORATORY LAPAROTOMY  2015  ? RHINOPLASTY    ? TUBAL LIGATION  2013  ? ? ?OB History   ?No obstetric history on file. ?  ? ? ? ?Home Medications   ? ?Prior to Admission medications   ?Medication Sig Start Date End Date Taking? Authorizing Provider  ?ondansetron (ZOFRAN-ODT) 4 MG disintegrating tablet Take 1 tablet (4 mg total) by mouth every 8 (eight) hours as needed for nausea or vomiting. 02/20/22  Yes Particia Nearing, PA-C  ?amoxicillin-clavulanate (AUGMENTIN) 875-125 MG tablet Take 1 tablet by mouth 2 (two) times daily. One po bid x 7 days 03/21/21   Mancel Bale, MD  ?azithromycin (ZITHROMAX) 250 MG tablet Take first 2 tablets together, then 1 every day until finished.  01/15/22   Particia Nearing, PA-C  ?benzonatate (TESSALON) 100 MG capsule Take 1-2 capsules (100-200 mg total) by mouth 3 (three) times daily as needed for cough. 01/15/22   Particia Nearing, PA-C  ?busPIRone (BUSPAR) 10 MG tablet Take 10 mg by mouth 2 (two) times daily.     [provider]  ?butalbital-acetaminophen-caffeine (FIORICET) 50-325-40 MG tablet Take 2 tablets by mouth every 4 (four) hours as needed for headache or migraine.  12/29/19   [provider]  ?cephALEXin (KEFLEX) 250 MG capsule Take 250 mg by mouth at bedtime. 12/29/19   [provider]  ?cetirizine (ZYRTEC ALLERGY) 10 MG tablet Take 1 tablet (10 mg total) by mouth daily. 12/06/21   Wallis Bamberg, PA-C  ?citalopram (CELEXA) 40 MG tablet Take 40 mg by mouth daily.    [provider]  ?cyclobenzaprine (FLEXERIL) 10 MG tablet Take 10 mg by mouth at bedtime. May take 3 times daily as needed 01/17/20   [provider]  ?diclofenac (VOLTAREN) 75 MG EC tablet Take 75 mg by mouth 2 (two) times daily.    [provider]  ?HYDROcodone-acetaminophen (NORCO/VICODIN) 5-325 MG tablet Take 1 tablet by mouth every 6 (six) hours as needed. 02/17/20   Couture, Cortni S, PA-C  ?ipratropium (ATROVENT) 0.03 % nasal spray Place 2 sprays into both nostrils 2 (  two) times daily. 12/06/21   Wallis Bamberg, PA-C  ?lisinopril-hydrochlorothiazide (ZESTORETIC) 20-25 MG tablet Take 1 tablet by mouth daily. 01/17/20   [provider]  ?nitrofurantoin, macrocrystal-monohydrate, (MACROBID) 100 MG capsule Take 1 capsule (100 mg total) by mouth 2 (two) times daily. 06/09/21   Khatri, Hina, PA-C  ?nortriptyline (PAMELOR) 10 MG capsule Take 30 mg by mouth at bedtime. 01/30/20   [provider]  ?ondansetron (ZOFRAN) 4 MG tablet Take 1 tablet (4 mg total) by mouth every 6 (six) hours. 02/16/20   Couture, Cortni S, PA-C  ?ondansetron (ZOFRAN-ODT) 4 MG disintegrating tablet Take 1 tablet (4 mg total) by mouth every 8 (eight)  hours as needed for nausea or vomiting. 01/23/22   Particia Nearing, PA-C  ?oxyCODONE-acetaminophen (PERCOCET) 5-325 MG tablet Take 1 tablet by mouth every 4 (four) hours as needed for moderate pain or severe pain. 03/21/21   Mancel Bale, MD  ?oxyCODONE-acetaminophen (PERCOCET) 5-325 MG tablet Take 1 tablet by mouth every 4 (four) hours as needed for moderate pain (Prepack to go). 03/21/21   Mancel Bale, MD  ?oxyCODONE-acetaminophen (PERCOCET/ROXICET) 5-325 MG tablet Take 1 tablet by mouth every 6 (six) hours as needed for severe pain. 07/15/21   Triplett, Tammy, PA-C  ?potassium chloride (KLOR-CON) 10 MEQ tablet Take 1 tablet (10 mEq total) by mouth daily for 4 days. 02/17/20 02/21/20  Couture, Cortni S, PA-C  ?predniSONE (DELTASONE) 10 MG tablet Take 6 tablets day one, 5 tablets day two, 4 tablets day three, 3 tablets day four, 2 tablets day five, then 1 tablet day six 07/15/21   Triplett, Tammy, PA-C  ?predniSONE (DELTASONE) 20 MG tablet Take 2 tablets (40 mg total) by mouth daily with breakfast. 01/15/22   Particia Nearing, PA-C  ?promethazine-dextromethorphan (PROMETHAZINE-DM) 6.25-15 MG/5ML syrup Take 5 mLs by mouth at bedtime as needed for cough. 12/06/21   Wallis Bamberg, PA-C  ?promethazine-dextromethorphan (PROMETHAZINE-DM) 6.25-15 MG/5ML syrup Take 5 mLs by mouth 4 (four) times daily as needed. 01/15/22   Particia Nearing, PA-C  ?pseudoephedrine (SUDAFED) 30 MG tablet Take 1 tablet (30 mg total) by mouth every 8 (eight) hours as needed for congestion. 12/06/21   Wallis Bamberg, PA-C  ?venlafaxine (EFFEXOR) 75 MG tablet Take 1 tablet by mouth 2 (two) times daily. 09/06/21   [provider]  ?VICTOZA 18 MG/3ML SOPN Inject into the skin. 02/07/22   [provider]  ? ? ?Family History ?Family History  ?Problem Relation Age of Onset  ? Liver cancer Father   ?     LIVER  ? Diabetes Other   ? Hypertension Other   ? Hypothyroidism Other   ? Stroke Other   ? Heart disease Other   ? Bladder  Cancer Neg Hx   ? Prostate cancer Neg Hx   ? Kidney cancer Neg Hx   ? ? ?Social History ?Social History  ? ?Tobacco Use  ? Smoking status: Never  ?  Passive exposure: Never  ? Smokeless tobacco: Never  ?Vaping Use  ? Vaping Use: Never used  ?Substance Use Topics  ? Alcohol use: Yes  ?  Comment: Social  ? Drug use: No  ? ? ? ?Allergies   ?Sulfa antibiotics ? ? ?Review of Systems ?Review of Systems ?Per HPI ? ?Physical Exam ?Triage Vital Signs ?ED Triage Vitals  ?Enc Vitals Group  ?   BP 02/20/22 1200 114/78  ?   Pulse Rate 02/20/22 1200 (!) 115  ?   Resp 02/20/22 1200 18  ?  Temp 02/20/22 1200 98.1 ?F (36.7 ?C)  ?   Temp Source 02/20/22 1200 Oral  ?   SpO2 02/20/22 1200 97 %  ?   Weight --   ?   Height --   ?   Head Circumference --   ?   Peak Flow --   ?   Pain Score 02/20/22 1203 7  ?   Pain Loc --   ?   Pain Edu? --   ?   Excl. in GC? --   ? ?No data found. ? ?Updated Vital Signs ?BP 114/78 (BP Location: Right Arm)   Pulse (!) 115   Temp 98.1 ?F (36.7 ?C) (Oral)   Resp 18   LMP 02/09/2022 (Approximate)   SpO2 97%  ? ?Visual Acuity ?Right Eye Distance:   ?Left Eye Distance:   ?Bilateral Distance:   ? ?Right Eye Near:   ?Left Eye Near:    ?Bilateral Near:    ? ?Physical Exam ?Vitals and nursing note reviewed.  ?Constitutional:   ?   Appearance: Normal appearance. She is not ill-appearing.  ?HENT:  ?   Head: Atraumatic.  ?   Nose: Nose normal.  ?   Mouth/Throat:  ?   Mouth: Mucous membranes are moist.  ?Eyes:  ?   Extraocular Movements: Extraocular movements intact.  ?   Conjunctiva/sclera: Conjunctivae normal.  ?Cardiovascular:  ?   Rate and Rhythm: Normal rate and regular rhythm.  ?   Heart sounds: Normal heart sounds.  ?Pulmonary:  ?   Effort: Pulmonary effort is normal.  ?   Breath sounds: Normal breath sounds.  ?Abdominal:  ?   General: Bowel sounds are normal. There is no distension.  ?   Palpations: Abdomen is soft.  ?   Tenderness: There is abdominal tenderness. There is no right CVA tenderness, left  CVA tenderness or guarding.  ?   Comments: Mild generalized tenderness to palpation without distention or guarding  ?Musculoskeletal:     ?   General: Normal range of motion.  ?   Cervical back: Normal range of

## 2022-04-08 ENCOUNTER — Emergency Department (HOSPITAL_COMMUNITY)
Admission: EM | Admit: 2022-04-08 | Discharge: 2022-04-09 | Disposition: A | Discharge status: Home / Self Care | Payer: Medicaid Other | Attending: Emergency Medicine | Admitting: Emergency Medicine

## 2022-04-08 ENCOUNTER — Encounter (HOSPITAL_COMMUNITY): Payer: Self-pay | Admitting: *Deleted

## 2022-04-08 ENCOUNTER — Other Ambulatory Visit: Payer: Self-pay

## 2022-04-08 DIAGNOSIS — Z87442 Personal history of urinary calculi: Secondary | ICD-10-CM | POA: Insufficient documentation

## 2022-04-08 DIAGNOSIS — N390 Urinary tract infection, site not specified: Secondary | ICD-10-CM | POA: Diagnosis not present

## 2022-04-08 DIAGNOSIS — R300 Dysuria: Secondary | ICD-10-CM | POA: Diagnosis present

## 2022-04-08 DIAGNOSIS — M545 Low back pain, unspecified: Secondary | ICD-10-CM | POA: Insufficient documentation

## 2022-04-08 LAB — POC URINE PREG, ED: Preg Test, Ur: NEGATIVE

## 2022-04-08 LAB — URINALYSIS, ROUTINE W REFLEX MICROSCOPIC
Bilirubin Urine: NEGATIVE
Glucose, UA: NEGATIVE mg/dL
Ketones, ur: NEGATIVE mg/dL
Nitrite: NEGATIVE
Protein, ur: 30 mg/dL — AB
RBC / HPF: >50 RBC/hpf — ABNORMAL HIGH (ref 0–5)
Specific Gravity, Urine: 1.02 (ref 1.005–1.030)
WBC, UA: >50 WBC/hpf — ABNORMAL HIGH (ref 0–5)
pH: 6 (ref 5.0–8.0)

## 2022-04-08 MED ORDER — OXYCODONE-ACETAMINOPHEN 5-325 MG PO TABS
1.0000 | ORAL_TABLET | Freq: Once | ORAL | Status: AC
  Administered 2022-04-08: 1 via ORAL
  Filled 2022-04-08: qty 1

## 2022-04-08 MED ORDER — LIDOCAINE HCL (PF) 1 % IJ SOLN
INTRAMUSCULAR | Status: AC
  Filled 2022-04-08: qty 5

## 2022-04-08 MED ORDER — PHENAZOPYRIDINE HCL 200 MG PO TABS: 200.0000 mg | ORAL_TABLET | Freq: Three times a day (TID) | ORAL | 0 refills | Status: DC | PRN

## 2022-04-08 MED ORDER — CEFDINIR 300 MG PO CAPS: 300.0000 mg | ORAL_CAPSULE | Freq: Two times a day (BID) | ORAL | 0 refills | Status: DC

## 2022-04-08 MED ORDER — CEFTRIAXONE SODIUM 1 G IJ SOLR
1.0000 g | Freq: Once | INTRAMUSCULAR | Status: AC
  Administered 2022-04-08: 1 g via INTRAMUSCULAR
  Filled 2022-04-08: qty 10

## 2022-04-08 MED ORDER — PHENAZOPYRIDINE HCL 100 MG PO TABS
200.0000 mg | ORAL_TABLET | Freq: Once | ORAL | Status: AC
  Administered 2022-04-08: 200 mg via ORAL
  Filled 2022-04-08: qty 2

## 2022-04-08 NOTE — ED Provider Notes (Signed)
[facility] EMERGENCY DEPARTMENT Provider Note   CSN: [id number] Arrival date & time: 04/08/22  2229     History  Chief Complaint  Patient presents with   Dysuria    Amber Nolan is a 41 y.o. female.  Patient presents to the emergency department for evaluation of UTI symptoms.  Patient reports that she has a history of frequent recurrent UTI.  She is experiencing symptoms now for approximately a week.  Patient with urinary frequency, dysuria, right lower back and flank pain.      Home Medications Prior to Admission medications   Medication Sig Start Date End Date Taking? Authorizing Provider  cefdinir (OMNICEF) 300 MG capsule Take 1 capsule (300 mg total) by mouth 2 (two) times daily. 04/08/22  Yes Fynn Vanblarcom, Canary Brim, MD  phenazopyridine (PYRIDIUM) 200 MG tablet Take 1 tablet (200 mg total) by mouth 3 (three) times daily as needed for pain. 04/08/22  Yes Maddelynn Moosman, Canary Brim, MD  amoxicillin-clavulanate (AUGMENTIN) 875-125 MG tablet Take 1 tablet by mouth 2 (two) times daily. One po bid x 7 days 03/21/21   Mancel Bale, MD  azithromycin (ZITHROMAX) 250 MG tablet Take first 2 tablets together, then 1 every day until finished. 01/15/22   Particia Nearing, PA-C  benzonatate (TESSALON) 100 MG capsule Take 1-2 capsules (100-200 mg total) by mouth 3 (three) times daily as needed for cough. 01/15/22   Particia Nearing, PA-C  busPIRone (BUSPAR) 10 MG tablet Take 10 mg by mouth 2 (two) times daily.     [provider]  butalbital-acetaminophen-caffeine (FIORICET) 50-325-40 MG tablet Take 2 tablets by mouth every 4 (four) hours as needed for headache or migraine.  12/29/19   [provider]  cetirizine (ZYRTEC ALLERGY) 10 MG tablet Take 1 tablet (10 mg total) by mouth daily. 12/06/21   Wallis Bamberg, PA-C  citalopram (CELEXA) 40 MG tablet Take 40 mg by mouth daily.    [provider]  cyclobenzaprine (FLEXERIL) 10 MG tablet Take 10 mg by mouth  at bedtime. May take 3 times daily as needed 01/17/20   [provider]  diclofenac (VOLTAREN) 75 MG EC tablet Take 75 mg by mouth 2 (two) times daily.    [provider]  ipratropium (ATROVENT) 0.03 % nasal spray Place 2 sprays into both nostrils 2 (two) times daily. 12/06/21   Wallis Bamberg, PA-C  lisinopril-hydrochlorothiazide (ZESTORETIC) 20-25 MG tablet Take 1 tablet by mouth daily. 01/17/20   [provider]  nortriptyline (PAMELOR) 10 MG capsule Take 30 mg by mouth at bedtime. 01/30/20   [provider]  venlafaxine (EFFEXOR) 75 MG tablet Take 1 tablet by mouth 2 (two) times daily. 09/06/21   [provider]  VICTOZA 18 MG/3ML SOPN Inject into the skin. 02/07/22   [provider]      Allergies    Sulfa antibiotics    Review of Systems   Review of Systems  Physical Exam Updated Vital Signs BP 119/77   Pulse 78   Temp 97.8 F (36.6 C) (Oral)   Resp 20   Ht 5\' 4"  (1.626 m)   Wt 90.7 kg   LMP 04/06/2022   SpO2 96%   BMI 34.33 kg/m  Physical Exam Vitals and nursing note reviewed.  Constitutional:      General: She is not in acute distress.    Appearance: She is well-developed.  HENT:     Head: Normocephalic and atraumatic.     Mouth/Throat:     Mouth:  Mucous membranes are moist.  Eyes:     General: Vision grossly intact. Gaze aligned appropriately.     Extraocular Movements: Extraocular movements intact.     Conjunctiva/sclera: Conjunctivae normal.  Cardiovascular:     Rate and Rhythm: Normal rate and regular rhythm.     Pulses: Normal pulses.     Heart sounds: Normal heart sounds, S1 normal and S2 normal. No murmur heard.   No friction rub. No gallop.  Pulmonary:     Effort: Pulmonary effort is normal. No respiratory distress.     Breath sounds: Normal breath sounds.  Abdominal:     General: Bowel sounds are normal.     Palpations: Abdomen is soft.     Tenderness: There is no abdominal tenderness. There is right CVA  tenderness. There is no guarding or rebound.     Hernia: No hernia is present.  Musculoskeletal:        General: No swelling.     Cervical back: Full passive range of motion without pain, normal range of motion and neck supple. No spinous process tenderness or muscular tenderness. Normal range of motion.     Right lower leg: No edema.     Left lower leg: No edema.  Skin:    General: Skin is warm and dry.     Capillary Refill: Capillary refill takes less than 2 seconds.     Findings: No ecchymosis, erythema, rash or wound.  Neurological:     General: No focal deficit present.     Mental Status: She is alert and oriented to person, place, and time.     GCS: GCS eye subscore is 4. GCS verbal subscore is 5. GCS motor subscore is 6.     Cranial Nerves: Cranial nerves 2-12 are intact.     Sensory: Sensation is intact.     Motor: Motor function is intact.     Coordination: Coordination is intact.  Psychiatric:        Attention and Perception: Attention normal.        Mood and Affect: Mood normal.        Speech: Speech normal.        Behavior: Behavior normal.    ED Results / Procedures / Treatments   Labs (all labs ordered are listed, but only abnormal results are displayed) Labs Reviewed  URINALYSIS, ROUTINE W REFLEX MICROSCOPIC - Abnormal; Notable for the following components:      Result Value   APPearance HAZY (*)    Hgb urine dipstick LARGE (*)    Protein, ur 30 (*)    Leukocytes,Ua LARGE (*)    RBC / HPF >50 (*)    WBC, UA >50 (*)    Bacteria, UA RARE (*)    All other components within normal limits  POC URINE PREG, ED - Normal    EKG None  Radiology No results found.  Procedures Procedures    Medications Ordered in ED Medications  phenazopyridine (PYRIDIUM) tablet 200 mg (has no administration in time range)  oxyCODONE-acetaminophen (PERCOCET/ROXICET) 5-325 MG per tablet 1 tablet (has no administration in time range)  cefTRIAXone (ROCEPHIN) injection 1 g (has  no administration in time range)    ED Course/ Medical Decision Making/ A&P                           Medical Decision Making Amount and/or Complexity of Data Reviewed Labs: ordered.   Patient presents to the emergency department  for evaluation of urinary tract infection symptoms with right flank discomfort.  Patient has a history of kidney stones as well as frequent recurrent UTI.  Patient reports that she had a kidney stone removed in October and has not had any recurrence of infection since that time.  At that time she only had a single solitary ureterolithiasis, no renal stones.  Doubt recurrence of ureterolithiasis, urinalysis is consistent with infection.  Offered CT scan to rule out stone, although felt to be low yield.  Patient would prefer not to have a scan and it is unlikely that she has developed a stone since her last scans.  We will treat for infection, given return precautions for fever, intractable vomiting, worsening pain.        Final Clinical Impression(s) / ED Diagnoses Final diagnoses:  Urinary tract infection with hematuria, site unspecified    Rx / DC Orders ED Discharge Orders          Ordered    phenazopyridine (PYRIDIUM) 200 MG tablet  3 times daily PRN        04/08/22 2319    cefdinir (OMNICEF) 300 MG capsule  2 times daily        04/08/22 2319              Gilda CreasePollina, Rease Swinson J, MD 04/08/22 2320

## 2022-04-08 NOTE — ED Triage Notes (Signed)
Pt with burning on urination and foul odor to urine x 1 week. P with hx of chronic UTI's and kidney stone removed back in October.  + lower right back pain

## 2022-05-19 ENCOUNTER — Emergency Department (HOSPITAL_COMMUNITY)
Admission: EM | Admit: 2022-05-19 | Discharge: 2022-05-19 | Disposition: A | Discharge status: Home / Self Care | Payer: Medicaid Other | Attending: Emergency Medicine | Admitting: Emergency Medicine

## 2022-05-19 ENCOUNTER — Other Ambulatory Visit: Payer: Self-pay

## 2022-05-19 ENCOUNTER — Encounter (HOSPITAL_COMMUNITY): Payer: Self-pay | Admitting: *Deleted

## 2022-05-19 DIAGNOSIS — M62830 Muscle spasm of back: Secondary | ICD-10-CM | POA: Insufficient documentation

## 2022-05-19 DIAGNOSIS — M545 Low back pain, unspecified: Secondary | ICD-10-CM | POA: Diagnosis present

## 2022-05-19 DIAGNOSIS — M62838 Other muscle spasm: Secondary | ICD-10-CM

## 2022-05-19 MED ORDER — LIDOCAINE 5 % EX PTCH: 1.0000 | MEDICATED_PATCH | CUTANEOUS | 0 refills | Status: DC

## 2022-05-19 MED ORDER — OXYCODONE HCL 5 MG PO TABS
10.0000 mg | ORAL_TABLET | Freq: Once | ORAL | Status: AC
  Administered 2022-05-19: 10 mg via ORAL
  Filled 2022-05-19: qty 2

## 2022-05-19 MED ORDER — LIDOCAINE 5 % EX PTCH
1.0000 | MEDICATED_PATCH | CUTANEOUS | Status: DC
  Administered 2022-05-19: 1 via TRANSDERMAL
  Filled 2022-05-19: qty 1

## 2022-05-19 MED ORDER — KETOROLAC TROMETHAMINE 15 MG/ML IJ SOLN
15.0000 mg | Freq: Once | INTRAMUSCULAR | Status: AC
  Administered 2022-05-19: 15 mg via INTRAMUSCULAR
  Filled 2022-05-19: qty 1

## 2022-05-19 NOTE — ED Triage Notes (Signed)
Pt with right lower back pain since getting out of bed, fell yesterday after tripping and caught self jarring herself. Back pain has been bad all day. Pt with chronic back pain due to scoliosis.

## 2022-05-19 NOTE — ED Provider Notes (Signed)
[facility] EMERGENCY DEPARTMENT Provider Note   CSN: [id number] Arrival date & time: 05/19/22  2031     History Chief Complaint  Patient presents with   Back Pain    Amber Nolan is a 41 y.o. female patient with history of scoliosis who presents to the emergency department today for further evaluation of right mid to low back pain started when she woke up this morning.  Patient states that last night she was getting out of bed and tried to catch herself from falling which caused a jarring of her lower back.  She woke up this morning with the back pain.  She denies any focal weakness or numbness in the legs.  No bowel or bladder incontinence.  No urinary symptoms, fever, chills.  She has been taking Tylenol and her prescribed diclofenac for pain.  Has not taken diclofenac today.   Back Pain      Home Medications Prior to Admission medications   Medication Sig Start Date End Date Taking? Authorizing Provider  lidocaine (LIDODERM) 5 % Place 1 patch onto the skin daily. Remove & Discard patch within 12 hours or as directed by MD 05/19/22  Yes Meredeth Ide, Zuleika Gallus M, PA-C  amoxicillin-clavulanate (AUGMENTIN) 875-125 MG tablet Take 1 tablet by mouth 2 (two) times daily. One po bid x 7 days 03/21/21   Mancel Bale, MD  azithromycin (ZITHROMAX) 250 MG tablet Take first 2 tablets together, then 1 every day until finished. 01/15/22   Particia Nearing, PA-C  benzonatate (TESSALON) 100 MG capsule Take 1-2 capsules (100-200 mg total) by mouth 3 (three) times daily as needed for cough. 01/15/22   Particia Nearing, PA-C  busPIRone (BUSPAR) 10 MG tablet Take 10 mg by mouth 2 (two) times daily.     [provider]  butalbital-acetaminophen-caffeine (FIORICET) 50-325-40 MG tablet Take 2 tablets by mouth every 4 (four) hours as needed for headache or migraine.  12/29/19   [provider]  cefdinir (OMNICEF) 300 MG capsule Take 1 capsule (300 mg total) by mouth 2 (two) times  daily. 04/08/22   Gilda Crease, MD  cetirizine (ZYRTEC ALLERGY) 10 MG tablet Take 1 tablet (10 mg total) by mouth daily. 12/06/21   Wallis Bamberg, PA-C  citalopram (CELEXA) 40 MG tablet Take 40 mg by mouth daily.    [provider]  cyclobenzaprine (FLEXERIL) 10 MG tablet Take 10 mg by mouth at bedtime. May take 3 times daily as needed 01/17/20   [provider]  diclofenac (VOLTAREN) 75 MG EC tablet Take 75 mg by mouth 2 (two) times daily.    [provider]  ipratropium (ATROVENT) 0.03 % nasal spray Place 2 sprays into both nostrils 2 (two) times daily. 12/06/21   Wallis Bamberg, PA-C  lisinopril-hydrochlorothiazide (ZESTORETIC) 20-25 MG tablet Take 1 tablet by mouth daily. 01/17/20   [provider]  nortriptyline (PAMELOR) 10 MG capsule Take 30 mg by mouth at bedtime. 01/30/20   [provider]  phenazopyridine (PYRIDIUM) 200 MG tablet Take 1 tablet (200 mg total) by mouth 3 (three) times daily as needed for pain. 04/08/22   Gilda Crease, MD  venlafaxine (EFFEXOR) 75 MG tablet Take 1 tablet by mouth 2 (two) times daily. 09/06/21   [provider]  VICTOZA 18 MG/3ML SOPN Inject into the skin. 02/07/22   [provider]      Allergies    Sulfa antibiotics    Review of Systems   Review of Systems  Musculoskeletal:  Positive for back pain.  All other systems reviewed and are negative.   Physical Exam Updated Vital Signs BP 119/68 (BP Location: Right Arm)   Pulse 91   Temp 98.3 F (36.8 C) (Oral)   Resp 14   Ht 5\' 4"  (1.626 m)   Wt 90.7 kg   LMP 05/08/2022   SpO2 99%   BMI 34.33 kg/m  Physical Exam Vitals and nursing note reviewed.  Constitutional:      Appearance: Normal appearance.  HENT:     Head: Normocephalic and atraumatic.  Eyes:     General:        Right eye: No discharge.        Left eye: No discharge.     Conjunctiva/sclera: Conjunctivae normal.  Pulmonary:     Effort: Pulmonary effort is  normal.  Musculoskeletal:       Arms:     Comments: 5/5 strength to the lower extremities.  Normal sensation to lower extremities.  Skin:    General: Skin is warm and dry.     Findings: No rash.  Neurological:     General: No focal deficit present.     Mental Status: She is alert.  Psychiatric:        Mood and Affect: Mood normal.        Behavior: Behavior normal.     ED Results / Procedures / Treatments   Labs (all labs ordered are listed, but only abnormal results are displayed) Labs Reviewed - No data to display  EKG None  Radiology No results found.  Procedures Procedures    Medications Ordered in ED Medications  lidocaine (LIDODERM) 5 % 1 patch (1 patch Transdermal Patch Applied 05/19/22 2159)  ketorolac (TORADOL) 15 MG/ML injection 15 mg (15 mg Intramuscular Given 05/19/22 2201)  oxyCODONE (Oxy IR/ROXICODONE) immediate release tablet 10 mg (10 mg Oral Given 05/19/22 2159)    ED Course/ Medical Decision Making/ A&P                           Medical Decision Making Kimberlee Moana Munford is a 41 y.o. female patient who presents to the emergency department today for further evaluation of right lower back pain.  This seems to be consistent with lumbar spasm.  No signs clinically for fracture dislocations.  Not feel any obvious step-offs.  Do not feel imaging is necessary today.  I will give her a shot of Toradol, 2 of oxycodone, and a lidocaine patch and plan to reassess.  She is feeling better after pain medications here.  Patient does have severe scoliosis and this could be contributing to the spasm.  Going to have her follow-up with her neurosurgery team and primary care doctors for further evaluation.  I will prescribe her lidocaine patches.  She has Flexeril and diclofenac at home.  She was encouraged to return to the emergency department for any worsening symptoms.  Risk Prescription drug management.    Final Clinical Impression(s) / ED Diagnoses Final  diagnoses:  Muscle spasm    Rx / DC Orders ED Discharge Orders          Ordered    lidocaine (LIDODERM) 5 %  Every 24 hours        05/19/22 2248              07/20/22 [place], PA-C 05/19/22 2250    07/20/22, MD 05/21/22 0022

## 2022-05-19 NOTE — Discharge Instructions (Signed)
You can use lidocaine patches every 12 hours as needed for your muscle spasms.  Please continue taking Flexeril and your diclofenac as needed.  Follow-up with your neurosurgery team we discussed.  Please return to the [facility] for any worsening symptoms you might have.

## 2022-06-06 ENCOUNTER — Ambulatory Visit: Payer: Medicaid Other | Attending: Neurology

## 2022-06-06 DIAGNOSIS — G4733 Obstructive sleep apnea (adult) (pediatric): Secondary | ICD-10-CM | POA: Insufficient documentation

## 2022-06-06 DIAGNOSIS — E669 Obesity, unspecified: Secondary | ICD-10-CM | POA: Insufficient documentation

## 2022-06-06 DIAGNOSIS — Z6836 Body mass index (BMI) 36.0-36.9, adult: Secondary | ICD-10-CM | POA: Diagnosis not present

## 2022-06-16 ENCOUNTER — Encounter (HOSPITAL_COMMUNITY): Payer: Self-pay

## 2022-06-16 ENCOUNTER — Emergency Department (HOSPITAL_COMMUNITY)
Admission: EM | Admit: 2022-06-16 | Discharge: 2022-06-16 | Disposition: A | Discharge status: Home / Self Care | Payer: BC Managed Care – PPO | Attending: Emergency Medicine | Admitting: Emergency Medicine

## 2022-06-16 ENCOUNTER — Other Ambulatory Visit: Payer: Self-pay

## 2022-06-16 DIAGNOSIS — N3001 Acute cystitis with hematuria: Secondary | ICD-10-CM | POA: Insufficient documentation

## 2022-06-16 DIAGNOSIS — G43009 Migraine without aura, not intractable, without status migrainosus: Secondary | ICD-10-CM | POA: Insufficient documentation

## 2022-06-16 DIAGNOSIS — N3000 Acute cystitis without hematuria: Secondary | ICD-10-CM

## 2022-06-16 LAB — BASIC METABOLIC PANEL
Anion gap: 5 (ref 5–15)
BUN: 12 mg/dL (ref 6–20)
CO2: 29 mmol/L (ref 22–32)
Calcium: 8.8 mg/dL — ABNORMAL LOW (ref 8.9–10.3)
Chloride: 103 mmol/L (ref 98–111)
Creatinine, Ser: 0.68 mg/dL (ref 0.44–1.00)
GFR, Estimated: >60 mL/min (ref >60)
Glucose, Bld: 97 mg/dL (ref 70–99)
Potassium: 3.7 mmol/L (ref 3.5–5.1)
Sodium: 137 mmol/L (ref 135–145)

## 2022-06-16 LAB — PREGNANCY, URINE: Preg Test, Ur: NEGATIVE

## 2022-06-16 LAB — URINALYSIS, ROUTINE W REFLEX MICROSCOPIC
Bacteria, UA: NONE SEEN
Bilirubin Urine: NEGATIVE
Glucose, UA: NEGATIVE mg/dL
Hgb urine dipstick: NEGATIVE
Ketones, ur: NEGATIVE mg/dL
Nitrite: NEGATIVE
Protein, ur: 30 mg/dL — AB
Specific Gravity, Urine: 1.02 (ref 1.005–1.030)
pH: 7 (ref 5.0–8.0)

## 2022-06-16 LAB — CBC
HCT: 37.6 % (ref 36.0–46.0)
Hemoglobin: 12.6 g/dL (ref 12.0–15.0)
MCH: 31.2 pg (ref 26.0–34.0)
MCHC: 33.5 g/dL (ref 30.0–36.0)
MCV: 93.1 fL (ref 80.0–100.0)
Platelets: 392 10*3/uL (ref 150–400)
RBC: 4.04 MIL/uL (ref 3.87–5.11)
RDW: 12.3 % (ref 11.5–15.5)
WBC: 9.9 10*3/uL (ref 4.0–10.5)
nRBC: 0 % (ref 0.0–0.2)

## 2022-06-16 MED ORDER — SODIUM CHLORIDE 0.9 % IV BOLUS
1000.0000 mL | Freq: Once | INTRAVENOUS | Status: AC
  Administered 2022-06-16: 1000 mL via INTRAVENOUS

## 2022-06-16 MED ORDER — KETOROLAC TROMETHAMINE 15 MG/ML IJ SOLN
15.0000 mg | Freq: Once | INTRAMUSCULAR | Status: AC
  Administered 2022-06-16: 15 mg via INTRAVENOUS
  Filled 2022-06-16: qty 1

## 2022-06-16 MED ORDER — PROCHLORPERAZINE EDISYLATE 10 MG/2ML IJ SOLN
10.0000 mg | Freq: Once | INTRAMUSCULAR | Status: AC
  Administered 2022-06-16: 10 mg via INTRAVENOUS
  Filled 2022-06-16: qty 2

## 2022-06-16 MED ORDER — SODIUM CHLORIDE 0.9 % IV SOLN
1.0000 g | Freq: Once | INTRAVENOUS | Status: AC
  Administered 2022-06-16: 1 g via INTRAVENOUS
  Filled 2022-06-16: qty 10

## 2022-06-16 MED ORDER — DIPHENHYDRAMINE HCL 50 MG/ML IJ SOLN
25.0000 mg | Freq: Once | INTRAMUSCULAR | Status: AC
  Administered 2022-06-16: 25 mg via INTRAVENOUS
  Filled 2022-06-16: qty 1

## 2022-06-16 MED ORDER — DEXAMETHASONE SODIUM PHOSPHATE 10 MG/ML IJ SOLN
10.0000 mg | Freq: Once | INTRAMUSCULAR | Status: AC
  Administered 2022-06-16: 10 mg via INTRAVENOUS
  Filled 2022-06-16: qty 1

## 2022-06-16 MED ORDER — CEPHALEXIN 500 MG PO CAPS: 500.0000 mg | ORAL_CAPSULE | Freq: Two times a day (BID) | ORAL | 0 refills | Status: AC

## 2022-06-16 NOTE — Discharge Instructions (Signed)
Please take your antibiotic as prescribed in its entirety for management of your urinary tract infection.  Follow-up with your primary care doctor as needed.  Return if development of any new or worsening symptoms.

## 2022-06-16 NOTE — ED Triage Notes (Signed)
POV from home. Cc of UTI s/s for a few days. And then a migraine that started today. Vomiting with migraine.

## 2022-06-16 NOTE — ED Provider Notes (Signed)
[facility] EMERGENCY DEPARTMENT Provider Note   CSN: [id number] Arrival date & time: 06/16/22  1945     History {Add pertinent medical, surgical, social history, OB history to HPI:1} Chief Complaint  Patient presents with  . Migraine    Amber Nolan is a 41 y.o. female.  Patient with history of migraine presents today with complaints of headache and dysuria. She states that she has had dysuria for the past several days and suspects she has a UTI. Denies any hematuria or vaginal discharge. States that she woke up this morning with a migraine that is consistent with her typical migraines that she has had in the past. Took home meds with minimal relief. Endorses associated nausea and photophobia. Denies dizziness, neck pain, or blurred vision.  The history is provided by the patient. No language interpreter was used.  Migraine      Home Medications Prior to Admission medications   Medication Sig Start Date End Date Taking? Authorizing Provider  cephALEXin (KEFLEX) 500 MG capsule Take 1 capsule (500 mg total) by mouth 2 (two) times daily for 7 days. 06/16/22 06/23/22 Yes Ladarian Bonczek, Shawn Route, PA-C  amoxicillin-clavulanate (AUGMENTIN) 875-125 MG tablet Take 1 tablet by mouth 2 (two) times daily. One po bid x 7 days 03/21/21   Mancel Bale, MD  azithromycin (ZITHROMAX) 250 MG tablet Take first 2 tablets together, then 1 every day until finished. 01/15/22   Particia Nearing, PA-C  benzonatate (TESSALON) 100 MG capsule Take 1-2 capsules (100-200 mg total) by mouth 3 (three) times daily as needed for cough. 01/15/22   Particia Nearing, PA-C  busPIRone (BUSPAR) 10 MG tablet Take 10 mg by mouth 2 (two) times daily.     [provider]  butalbital-acetaminophen-caffeine (FIORICET) 50-325-40 MG tablet Take 2 tablets by mouth every 4 (four) hours as needed for headache or migraine.  12/29/19   [provider]  cefdinir (OMNICEF) 300 MG capsule Take 1 capsule (300 mg  total) by mouth 2 (two) times daily. 04/08/22   Gilda Crease, MD  cetirizine (ZYRTEC ALLERGY) 10 MG tablet Take 1 tablet (10 mg total) by mouth daily. 12/06/21   Wallis Bamberg, PA-C  citalopram (CELEXA) 40 MG tablet Take 40 mg by mouth daily.    [provider]  cyclobenzaprine (FLEXERIL) 10 MG tablet Take 10 mg by mouth at bedtime. May take 3 times daily as needed 01/17/20   [provider]  diclofenac (VOLTAREN) 75 MG EC tablet Take 75 mg by mouth 2 (two) times daily.    [provider]  ipratropium (ATROVENT) 0.03 % nasal spray Place 2 sprays into both nostrils 2 (two) times daily. 12/06/21   Wallis Bamberg, PA-C  lidocaine (LIDODERM) 5 % Place 1 patch onto the skin daily. Remove & Discard patch within 12 hours or as directed by MD 05/19/22   Teressa Lower, PA-C  lisinopril-hydrochlorothiazide (ZESTORETIC) 20-25 MG tablet Take 1 tablet by mouth daily. 01/17/20   [provider]  nortriptyline (PAMELOR) 10 MG capsule Take 30 mg by mouth at bedtime. 01/30/20   [provider]  phenazopyridine (PYRIDIUM) 200 MG tablet Take 1 tablet (200 mg total) by mouth 3 (three) times daily as needed for pain. 04/08/22   Gilda Crease, MD  venlafaxine (EFFEXOR) 75 MG tablet Take 1 tablet by mouth 2 (two) times daily. 09/06/21   [provider]  VICTOZA 18 MG/3ML SOPN Inject into the skin. 02/07/22   [provider]  Allergies    Sulfa antibiotics    Review of Systems   Review of Systems  Physical Exam Updated Vital Signs BP 118/70 (BP Location: Left Arm)   Pulse 86   Temp 97.9 F (36.6 C) (Oral)   Resp 16   Ht 5\' 4"  (1.626 m)   Wt 88.5 kg   LMP 06/07/2022   SpO2 99%   BMI 33.47 kg/m  Physical Exam  ED Results / Procedures / Treatments   Labs (all labs ordered are listed, but only abnormal results are displayed) Labs Reviewed  URINALYSIS, ROUTINE W REFLEX MICROSCOPIC - Abnormal; Notable for the following components:       Result Value   APPearance CLOUDY (*)    Protein, ur 30 (*)    Leukocytes,Ua TRACE (*)    All other components within normal limits  BASIC METABOLIC PANEL - Abnormal; Notable for the following components:   Calcium 8.8 (*)    All other components within normal limits  PREGNANCY, URINE  CBC    EKG None  Radiology No results found.  Procedures Procedures  {Document cardiac monitor, telemetry assessment procedure when appropriate:1}  Medications Ordered in ED Medications  prochlorperazine (COMPAZINE) injection 10 mg (10 mg Intravenous Given 06/16/22 2224)  diphenhydrAMINE (BENADRYL) injection 25 mg (25 mg Intravenous Given 06/16/22 2224)  ketorolac (TORADOL) 15 MG/ML injection 15 mg (15 mg Intravenous Given 06/16/22 2224)  sodium chloride 0.9 % bolus 1,000 mL (0 mLs Intravenous Stopped 06/16/22 2312)  dexamethasone (DECADRON) injection 10 mg (10 mg Intravenous Given 06/16/22 2224)  cefTRIAXone (ROCEPHIN) 1 g in sodium chloride 0.9 % 100 mL IVPB (0 g Intravenous Stopped 06/16/22 2312)  diphenhydrAMINE (BENADRYL) injection 25 mg (25 mg Intravenous Given 06/16/22 2259)    ED Course/ Medical Decision Making/ A&P                           Medical Decision Making Amount and/or Complexity of Data Reviewed Labs: ordered.  Risk Prescription drug management.   ***  {Document critical care time when appropriate:1} {Document review of labs and clinical decision tools ie heart score, Chads2Vasc2 etc:1}  {Document your independent review of radiology images, and any outside records:1} {Document your discussion with family members, caretakers, and with consultants:1} {Document social determinants of health affecting pt's care:1} {Document your decision making why or why not admission, treatments were needed:1} Final Clinical Impression(s) / ED Diagnoses Final diagnoses:  Migraine without aura and without status migrainosus, not intractable  Acute cystitis without hematuria    Rx / DC  Orders ED Discharge Orders          Ordered    cephALEXin (KEFLEX) 500 MG capsule  2 times daily        06/16/22 2322

## 2022-07-29 ENCOUNTER — Ambulatory Visit
Admission: EM | Admit: 2022-07-29 | Discharge: 2022-07-29 | Disposition: A | Discharge status: Home / Self Care | Payer: PRIVATE HEALTH INSURANCE | Attending: Family Medicine | Admitting: Family Medicine

## 2022-07-29 DIAGNOSIS — G43009 Migraine without aura, not intractable, without status migrainosus: Secondary | ICD-10-CM | POA: Diagnosis not present

## 2022-07-29 MED ORDER — KETOROLAC TROMETHAMINE 30 MG/ML IJ SOLN
30.0000 mg | Freq: Once | INTRAMUSCULAR | Status: AC
  Administered 2022-07-29: 30 mg via INTRAMUSCULAR

## 2022-07-29 MED ORDER — SUMATRIPTAN SUCCINATE 6 MG/0.5ML ~~LOC~~ SOLN
6.0000 mg | Freq: Once | SUBCUTANEOUS | Status: AC
  Administered 2022-07-29: 6 mg via SUBCUTANEOUS

## 2022-07-29 MED ORDER — SUMATRIPTAN SUCCINATE 50 MG PO TABS: ORAL_TABLET | ORAL | 0 refills | Status: DC

## 2022-07-29 NOTE — ED Triage Notes (Signed)
Pt presents with ongoing migraine since yesterday. Reports taking fiorcet with no relief. Pt also endorses nausea and floaters. Hx of migraines.

## 2022-07-29 NOTE — ED Provider Notes (Signed)
[facility]    CSN: [id number] Arrival date & time: 07/29/22  1234      History   Chief Complaint Chief Complaint  Patient presents with   Migraine    HPI Amber Nolan is a 41 y.o. female.   Presenting today with new onset migraine since yesterday.  States it is behind both of her eyes and feels like a knife stabbing into her head.  She is having some light sensitivity, smell sensitivity and mild nausea associated which she states are all normal features of her typical migraines.  Tried some of her Fioricet she has at home yesterday but no relief with this.  Denies speech or vision change, mental status change, head injury, vomiting.    Past Medical History:  Diagnosis Date   Anxiety and depression    Gastritis    GERD (gastroesophageal reflux disease)    Migraine    Migraines    Scoliosis    Stomach ulcer     Patient Active Problem List   Diagnosis Date Noted   Migraine with aura 01/10/2014    Past Surgical History:  Procedure Laterality Date   APPENDECTOMY  1993   CHOLECYSTECTOMY  2014   EXPLORATORY LAPAROTOMY  2015   RHINOPLASTY     TUBAL LIGATION  2013    OB History   No obstetric history on file.      Home Medications    Prior to Admission medications   Medication Sig Start Date End Date Taking? Authorizing Provider  SUMAtriptan (IMITREX) 50 MG tablet Take 1 tab at onset of migraine. May repeat in 2 hours if headache persists or recurs. Max of 2 tabs daily 07/29/22  Yes Volney American, PA-C  amoxicillin-clavulanate (AUGMENTIN) 875-125 MG tablet Take 1 tablet by mouth 2 (two) times daily. One po bid x 7 days 03/21/21   Daleen Bo, MD  azithromycin (ZITHROMAX) 250 MG tablet Take first 2 tablets together, then 1 every day until finished. 01/15/22   Volney American, PA-C  benzonatate (TESSALON) 100 MG capsule Take 1-2 capsules (100-200 mg total) by mouth 3 (three) times daily as needed for cough. 01/15/22   Volney American, PA-C  busPIRone (BUSPAR) 10 MG tablet Take 10 mg by mouth 2 (two) times daily.     [provider]  butalbital-acetaminophen-caffeine (FIORICET) 50-325-40 MG tablet Take 2 tablets by mouth every 4 (four) hours as needed for headache or migraine.  12/29/19   [provider]  cefdinir (OMNICEF) 300 MG capsule Take 1 capsule (300 mg total) by mouth 2 (two) times daily. 04/08/22   Orpah Greek, MD  cetirizine (ZYRTEC ALLERGY) 10 MG tablet Take 1 tablet (10 mg total) by mouth daily. 12/06/21   Jaynee Eagles, PA-C  citalopram (CELEXA) 40 MG tablet Take 40 mg by mouth daily.    [provider]  cyclobenzaprine (FLEXERIL) 10 MG tablet Take 10 mg by mouth at bedtime. May take 3 times daily as needed 01/17/20   [provider]  diclofenac (VOLTAREN) 75 MG EC tablet Take 75 mg by mouth 2 (two) times daily.    [provider]  ipratropium (ATROVENT) 0.03 % nasal spray Place 2 sprays into both nostrils 2 (two) times daily. 12/06/21   Jaynee Eagles, PA-C  lidocaine (LIDODERM) 5 % Place 1 patch onto the skin daily. Remove & Discard patch within 12 hours or as directed by MD 05/19/22   Hendricks Limes, PA-C  lisinopril-hydrochlorothiazide (ZESTORETIC) 20-25 MG tablet  Take 1 tablet by mouth daily. 01/17/20   [provider]  nortriptyline (PAMELOR) 10 MG capsule Take 30 mg by mouth at bedtime. 01/30/20   [provider]  phenazopyridine (PYRIDIUM) 200 MG tablet Take 1 tablet (200 mg total) by mouth 3 (three) times daily as needed for pain. 04/08/22   Gilda Crease, MD  venlafaxine (EFFEXOR) 75 MG tablet Take 1 tablet by mouth 2 (two) times daily. 09/06/21   [provider]  VICTOZA 18 MG/3ML SOPN Inject into the skin. 02/07/22   [provider]    Family History Family History  Problem Relation Age of Onset   Liver cancer Father        LIVER   Diabetes Other    Hypertension Other    Hypothyroidism Other     Stroke Other    Heart disease Other    Bladder Cancer Neg Hx    Prostate cancer Neg Hx    Kidney cancer Neg Hx     Social History Social History   Tobacco Use   Smoking status: Never    Passive exposure: Never   Smokeless tobacco: Never  Vaping Use   Vaping Use: Never used  Substance Use Topics   Alcohol use: Yes    Comment: Social   Drug use: No     Allergies   Sulfa antibiotics   Review of Systems Review of Systems Per HPI  Physical Exam Triage Vital Signs ED Triage Vitals  Enc Vitals Group     BP 07/29/22 1334 102/70     Pulse Rate 07/29/22 1334 77     Resp 07/29/22 1334 18     Temp 07/29/22 1334 98.1 F (36.7 C)     Temp src --      SpO2 07/29/22 1334 95 %     Weight --      Height --      Head Circumference --      Peak Flow --      Pain Score 07/29/22 1331 8     Pain Loc --      Pain Edu? --      Excl. in GC? --    No data found.  Updated Vital Signs BP 102/70   Pulse 77   Temp 98.1 F (36.7 C)   Resp 18   LMP 07/15/2022   SpO2 95%   Visual Acuity Right Eye Distance:   Left Eye Distance:   Bilateral Distance:    Right Eye Near:   Left Eye Near:    Bilateral Near:     Physical Exam Vitals and nursing note reviewed.  Constitutional:      Appearance: Normal appearance. She is not ill-appearing.  HENT:     Head: Atraumatic.  Eyes:     Extraocular Movements: Extraocular movements intact.     Conjunctiva/sclera: Conjunctivae normal.     Pupils: Pupils are equal, round, and reactive to light.  Cardiovascular:     Rate and Rhythm: Normal rate and regular rhythm.     Heart sounds: Normal heart sounds.  Pulmonary:     Effort: Pulmonary effort is normal.     Breath sounds: Normal breath sounds.  Musculoskeletal:        General: Normal range of motion.     Cervical back: Normal range of motion and neck supple.  Skin:    General: Skin is warm and dry.  Neurological:     General: No focal deficit present.  Mental Status: She is  alert and oriented to person, place, and time.     Cranial Nerves: No cranial nerve deficit.     Motor: No weakness.     Gait: Gait normal.  Psychiatric:        Mood and Affect: Mood normal.        Thought Content: Thought content normal.        Judgment: Judgment normal.      UC Treatments / Results  Labs (all labs ordered are listed, but only abnormal results are displayed) Labs Reviewed - No data to display  EKG   Radiology No results found.  Procedures Procedures (including critical care time)  Medications Ordered in UC Medications  ketorolac (TORADOL) 30 MG/ML injection 30 mg (30 mg Intramuscular Given 07/29/22 1406)  SUMAtriptan (IMITREX) injection 6 mg (6 mg Subcutaneous Given 07/29/22 1406)    Initial Impression / Assessment and Plan / UC Course  I have reviewed the triage vital signs and the nursing notes.  Pertinent labs & imaging results that were available during my care of the patient were reviewed by me and considered in my medical decision making (see chart for details).     We will treat with IM Toradol, IM Imitrex and sent Imitrex p.o. to pharmacy for as needed use if not fully relieved by medications in clinic.  Discussed supportive home care and strict return precautions for worsening symptoms.  No neurologic deficits noted today on exam and her features are her classic migraine features.  Note given.  Final Clinical Impressions(s) / UC Diagnoses   Final diagnoses:  Migraine without aura and without status migrainosus, not intractable   Discharge Instructions   None    ED Prescriptions     Medication Sig Dispense Auth. Provider   SUMAtriptan (IMITREX) 50 MG tablet Take 1 tab at onset of migraine. May repeat in 2 hours if headache persists or recurs. Max of 2 tabs daily 10 tablet Particia Nearing, [place]      PDMP not reviewed this encounter.   Particia Nearing, [place] 07/29/22 1437

## 2022-08-01 IMAGING — CR DG SCOLIOSIS EVAL COMPLETE SPINE 2-3V
1 series · 8 of 8 positions shown · non-contrast
Comparison: No prior chest CT

CLINICAL DATA: 39-year-old female with history of back pain

EXAM:
DG SCOLIOSIS EVAL COMPLETE SPINE 2-3V

[Series 1: dg scoliosis eval complete spine 2 or 3  · 0.14mm/px · 8 of 8 slices shown]
[im 1/8]
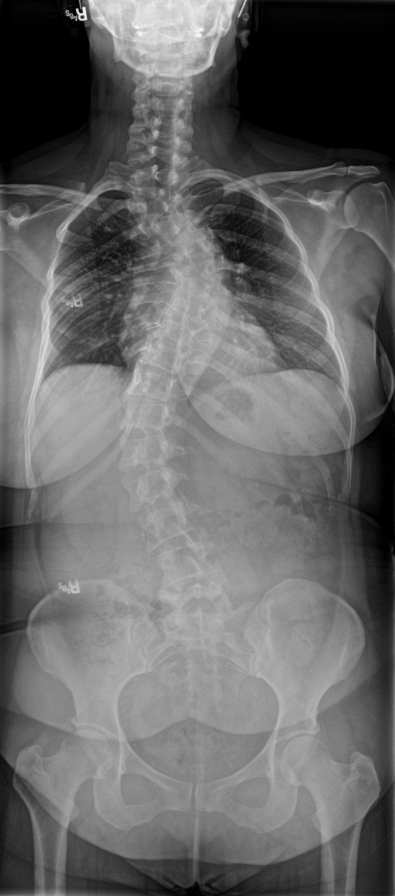
[im 2/8]
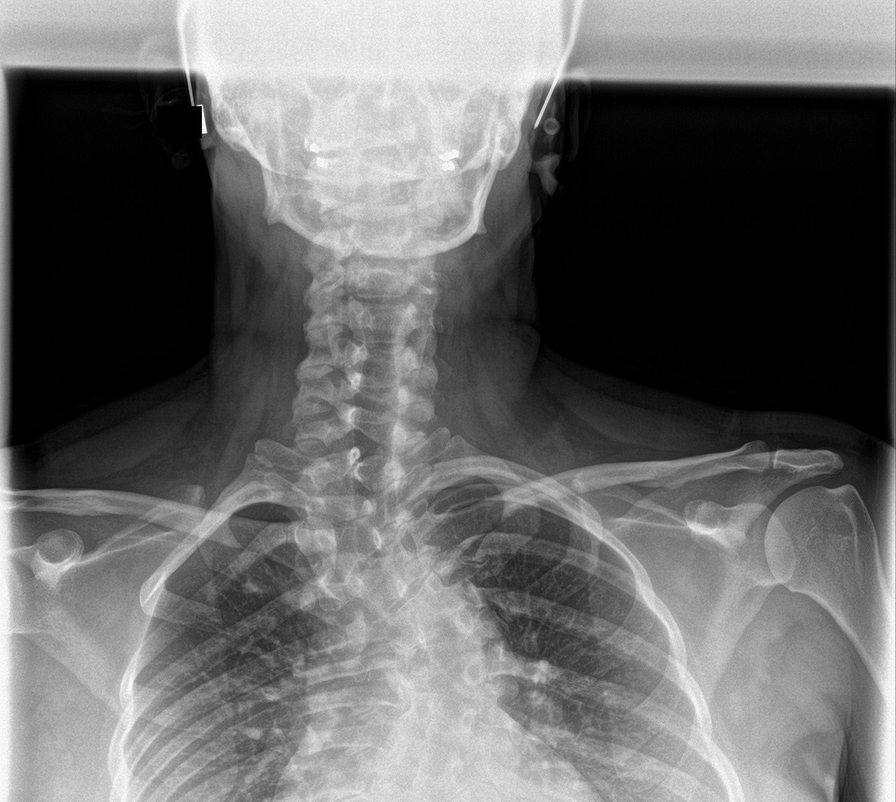
[im 3/8]
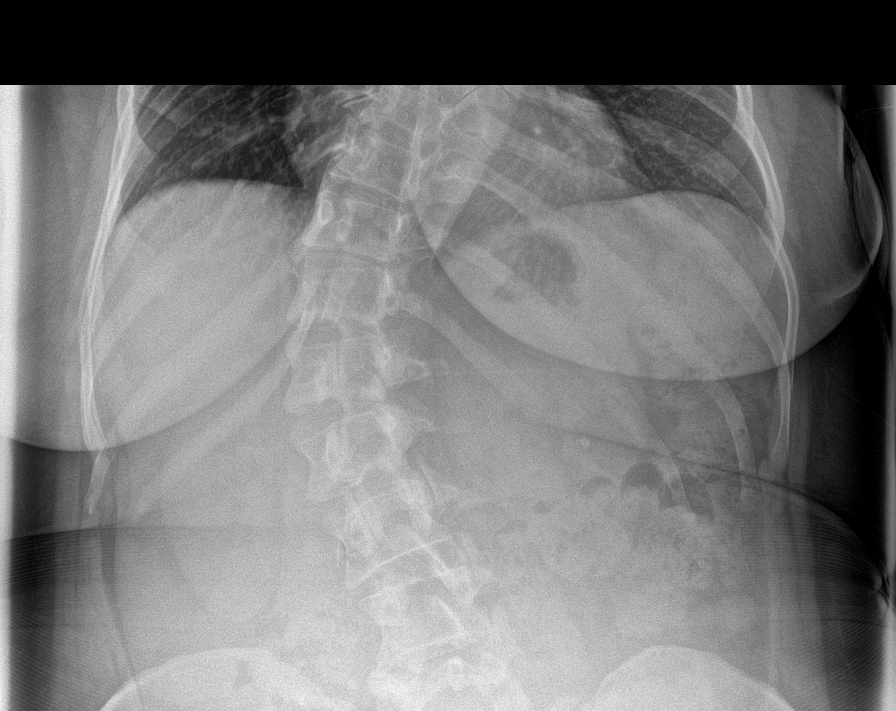
[im 4/8]
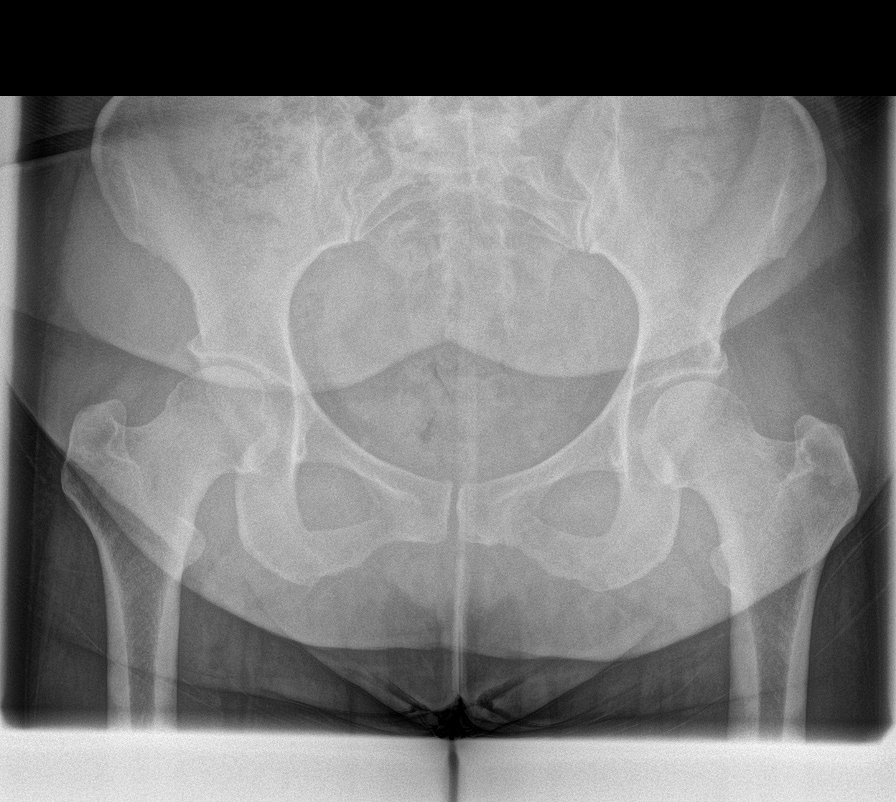
[im 5/8]
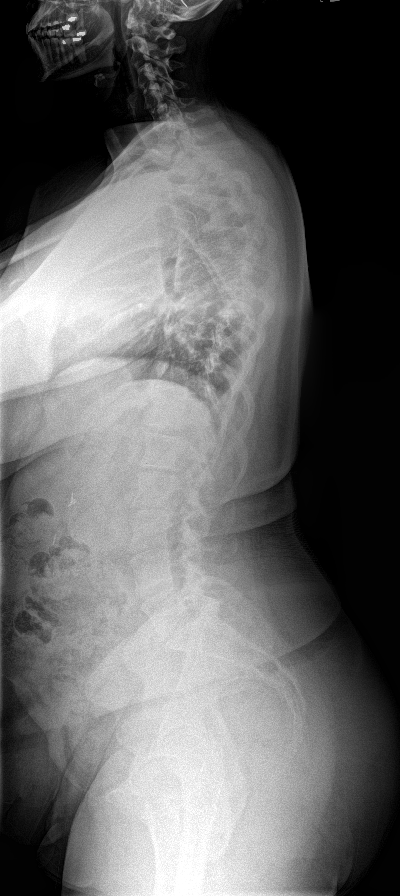
[im 6/8]
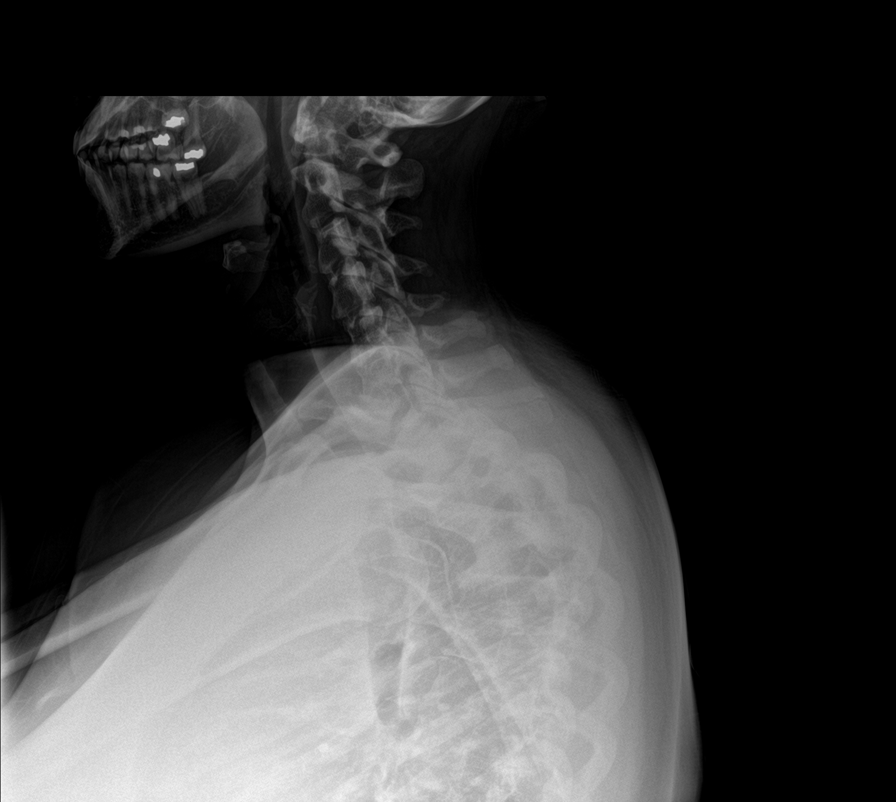
[im 7/8]
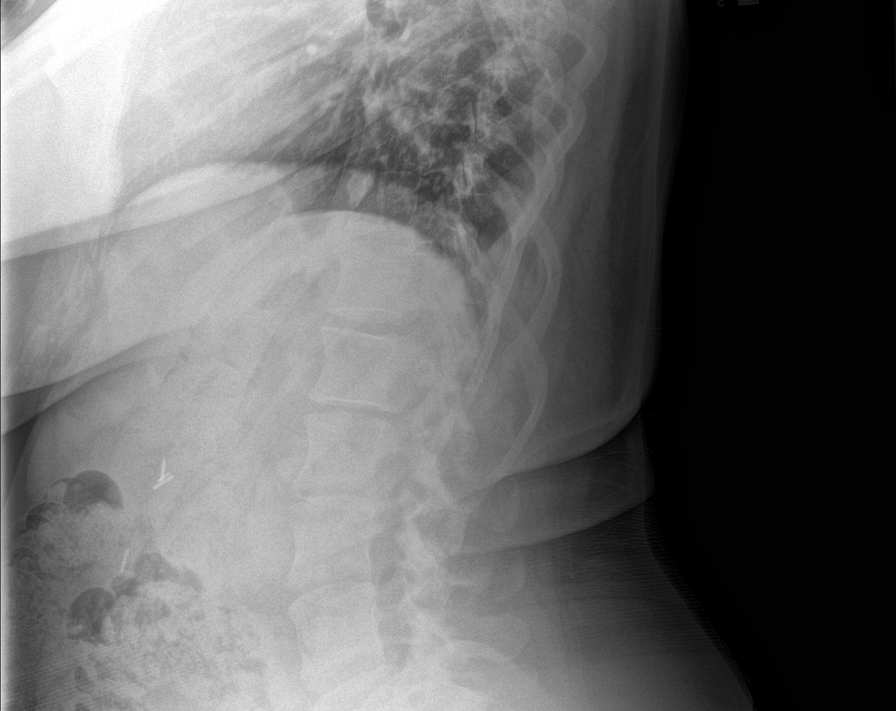
[im 8/8]
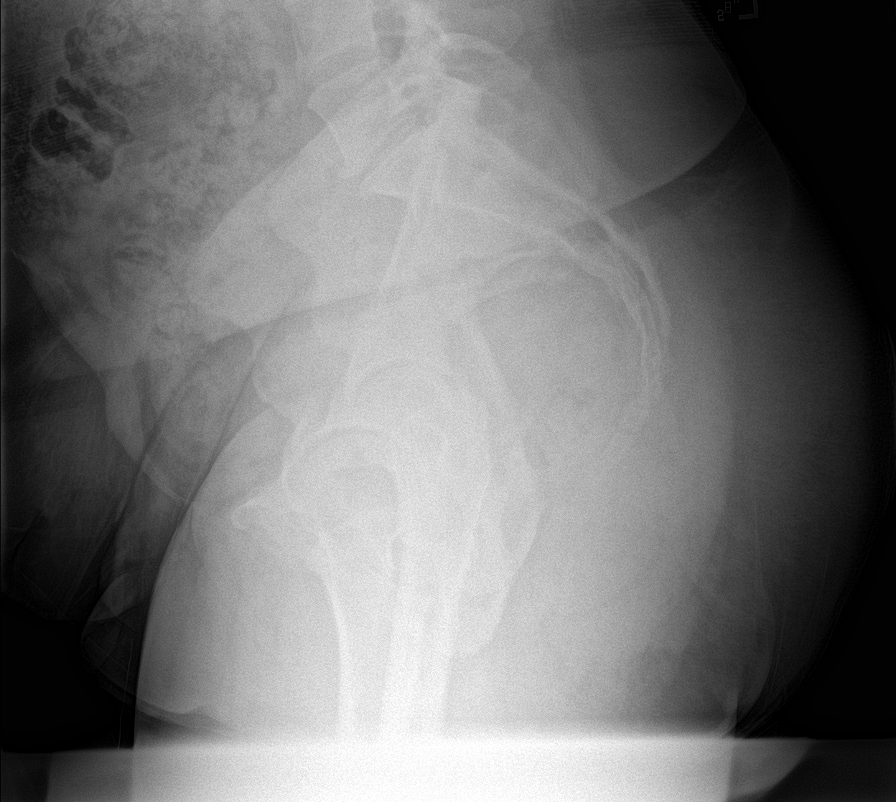

[8 of 8 positions shown; findings below may reference images not displayed]

FINDINGS: Twelve rib pairs identified.

Incomplete posterior fusion of T1.

Partial assimilation of T2-T4

Partial assimilation of T5-T7

Five lumbar type non rib-bearing vertebral bodies.

Levoscoliosis of estimated 68 degrees measured from the inferior
aspect of T4 to the superior endplate of T9.

Compensatory dextro rotoscoliosis of the thoracolumbar junction
estimated 48 degrees from the inferior endplate of T8 to the
superior endplate of L2.

No acute fracture identified.
IMPRESSION: S-shaped scoliotic curvature with segmentation anomaly of upper
thoracic vertebral bodies, as above.

## 2022-10-11 DIAGNOSIS — Z419 Encounter for procedure for purposes other than remedying health state, unspecified: Secondary | ICD-10-CM | POA: Diagnosis not present

## 2022-11-11 DIAGNOSIS — Z419 Encounter for procedure for purposes other than remedying health state, unspecified: Secondary | ICD-10-CM | POA: Diagnosis not present

## 2022-11-12 ENCOUNTER — Encounter: Payer: Self-pay | Admitting: Emergency Medicine

## 2022-11-12 ENCOUNTER — Ambulatory Visit
Admission: EM | Admit: 2022-11-12 | Discharge: 2022-11-12 | Disposition: A | Discharge status: Home / Self Care | Payer: PRIVATE HEALTH INSURANCE | Attending: Urgent Care | Admitting: Urgent Care

## 2022-11-12 DIAGNOSIS — J329 Chronic sinusitis, unspecified: Secondary | ICD-10-CM | POA: Diagnosis not present

## 2022-11-12 DIAGNOSIS — J309 Allergic rhinitis, unspecified: Secondary | ICD-10-CM | POA: Diagnosis not present

## 2022-11-12 MED ORDER — AMOXICILLIN 875 MG PO TABS: 875.0000 mg | ORAL_TABLET | Freq: Two times a day (BID) | ORAL | 0 refills | Status: DC

## 2022-11-12 MED ORDER — PROMETHAZINE-DM 6.25-15 MG/5ML PO SYRP: 5.0000 mL | ORAL_SOLUTION | Freq: Three times a day (TID) | ORAL | 0 refills | Status: DC | PRN

## 2022-11-12 MED ORDER — CETIRIZINE HCL 10 MG PO TABS: 10.0000 mg | ORAL_TABLET | Freq: Every day | ORAL | 0 refills | Status: AC

## 2022-11-12 MED ORDER — PSEUDOEPHEDRINE HCL 60 MG PO TABS: 60.0000 mg | ORAL_TABLET | Freq: Three times a day (TID) | ORAL | 0 refills | Status: DC | PRN

## 2022-11-12 NOTE — ED Provider Notes (Signed)
[facility]  Note:  This document was prepared using Dragon voice recognition software and may include unintentional dictation errors.  MRN: [id number] DOB: 1981-05-17  Subjective:   Amber Nolan is a 42 y.o. female presenting for 1 week history of acute onset sinus congestion, sinus drainage worse at night. Has throat pain, coughing, subjective fever, sinus headache. Has had chest soreness from coughing. No asthma. Has a history of allergies, takes Flonase for this.   No current facility-administered medications for this encounter.  Current Outpatient Medications:    benzonatate (TESSALON) 100 MG capsule, Take 1-2 capsules (100-200 mg total) by mouth 3 (three) times daily as needed for cough., Disp: 60 capsule, Rfl: 0   busPIRone (BUSPAR) 10 MG tablet, Take 10 mg by mouth 2 (two) times daily. , Disp: , Rfl:    butalbital-acetaminophen-caffeine (FIORICET) 50-325-40 MG tablet, Take 2 tablets by mouth every 4 (four) hours as needed for headache or migraine. , Disp: , Rfl:    cetirizine (ZYRTEC ALLERGY) 10 MG tablet, Take 1 tablet (10 mg total) by mouth daily., Disp: 30 tablet, Rfl: 0   citalopram (CELEXA) 40 MG tablet, Take 40 mg by mouth daily., Disp: , Rfl:    cyclobenzaprine (FLEXERIL) 10 MG tablet, Take 10 mg by mouth at bedtime. May take 3 times daily as needed, Disp: , Rfl:    diclofenac (VOLTAREN) 75 MG EC tablet, Take 75 mg by mouth 2 (two) times daily., Disp: , Rfl:    ipratropium (ATROVENT) 0.03 % nasal spray, Place 2 sprays into both nostrils 2 (two) times daily., Disp: 30 mL, Rfl: 0   lidocaine (LIDODERM) 5 %, Place 1 patch onto the skin daily. Remove & Discard patch within 12 hours or as directed by MD, Disp: 30 patch, Rfl: 0   lisinopril-hydrochlorothiazide (ZESTORETIC) 20-25 MG tablet, Take 1 tablet by mouth daily., Disp: , Rfl:    nortriptyline (PAMELOR) 10 MG capsule, Take 30 mg by mouth at bedtime., Disp: , Rfl:    phenazopyridine (PYRIDIUM) 200  MG tablet, Take 1 tablet (200 mg total) by mouth 3 (three) times daily as needed for pain., Disp: 6 tablet, Rfl: 0   SUMAtriptan (IMITREX) 50 MG tablet, Take 1 tab at onset of migraine. May repeat in 2 hours if headache persists or recurs. Max of 2 tabs daily, Disp: 10 tablet, Rfl: 0   venlafaxine (EFFEXOR) 75 MG tablet, Take 1 tablet by mouth 2 (two) times daily., Disp: , Rfl:    VICTOZA 18 MG/3ML SOPN, Inject into the skin., Disp: , Rfl:    Allergies  Allergen Reactions   Sulfa Antibiotics Nausea And Vomiting    Past Medical History:  Diagnosis Date   Anxiety and depression    Gastritis    GERD (gastroesophageal reflux disease)    Migraine    Migraines    Scoliosis    Stomach ulcer      Past Surgical History:  Procedure Laterality Date   APPENDECTOMY  1993   CHOLECYSTECTOMY  2014   EXPLORATORY LAPAROTOMY  2015   RHINOPLASTY     TUBAL LIGATION  2013    Family History  Problem Relation Age of Onset   Liver cancer Father        LIVER   Diabetes Other    Hypertension Other    Hypothyroidism Other    Stroke Other    Heart disease Other    Bladder Cancer Neg Hx    Prostate cancer Neg Hx    Kidney cancer  Neg Hx     Social History   Tobacco Use   Smoking status: Never    Passive exposure: Never   Smokeless tobacco: Never  Vaping Use   Vaping Use: Never used  Substance Use Topics   Alcohol use: Yes    Comment: Social   Drug use: No    ROS   Objective:   Vitals: BP 133/87 (BP Location: Right Arm)   Pulse 83   Temp 99.3 F (37.4 C) (Oral)   Resp 18   LMP 10/28/2022 (Exact Date)   SpO2 98%   Physical Exam Constitutional:      General: She is not in acute distress.    Appearance: Normal appearance. She is well-developed and normal weight. She is not ill-appearing, toxic-appearing or diaphoretic.  HENT:     Head: Normocephalic and atraumatic.     Right Ear: Tympanic membrane, ear canal and external ear normal. No drainage or tenderness. No middle ear  effusion. There is no impacted cerumen. Tympanic membrane is not erythematous or bulging.     Left Ear: Tympanic membrane, ear canal and external ear normal. No drainage or tenderness.  No middle ear effusion. There is no impacted cerumen. Tympanic membrane is not erythematous or bulging.     Nose: Nose normal. No congestion or rhinorrhea.     Mouth/Throat:     Mouth: Mucous membranes are moist. No oral lesions.     Pharynx: No pharyngeal swelling, oropharyngeal exudate, posterior oropharyngeal erythema or uvula swelling.     Tonsils: No tonsillar exudate or tonsillar abscesses.  Eyes:     General: No scleral icterus.       Right eye: No discharge.        Left eye: No discharge.     Extraocular Movements: Extraocular movements intact.     Right eye: Normal extraocular motion.     Left eye: Normal extraocular motion.     Conjunctiva/sclera: Conjunctivae normal.  Cardiovascular:     Rate and Rhythm: Normal rate and regular rhythm.     Heart sounds: Normal heart sounds. No murmur heard.    No friction rub. No gallop.  Pulmonary:     Effort: Pulmonary effort is normal. No respiratory distress.     Breath sounds: No stridor. No wheezing, rhonchi or rales.  Chest:     Chest wall: No tenderness.  Musculoskeletal:     Cervical back: Normal range of motion and neck supple.  Lymphadenopathy:     Cervical: No cervical adenopathy.  Skin:    General: Skin is warm and dry.  Neurological:     General: No focal deficit present.     Mental Status: She is alert and oriented to person, place, and time.  Psychiatric:        Mood and Affect: Mood normal.        Behavior: Behavior normal.     Assessment and Plan :   PDMP not reviewed this encounter.  1. Recurrent sinusitis   2. Allergic rhinitis, unspecified seasonality, unspecified trigger    Will start empiric treatment for sinusitis with amoxicillin. Hold Flonase while undergoing sinusitis. Start Zyrtec daily, use Sudafed this week then  prn. Deferred imaging given clear cardiopulmonary exam, hemodynamically stable vital signs.   Recommended supportive care otherwise. Counseled patient on potential for adverse effects with medications prescribed/recommended today, ER and return-to-clinic precautions discussed, patient verbalized understanding.    Jaynee Eagles, [place] 11/12/22 1811

## 2022-11-12 NOTE — ED Triage Notes (Signed)
Head congestion since last week.  Productive Cough, sore throat, headache.

## 2022-11-18 DIAGNOSIS — E669 Obesity, unspecified: Secondary | ICD-10-CM | POA: Diagnosis not present

## 2022-11-18 DIAGNOSIS — M545 Low back pain, unspecified: Secondary | ICD-10-CM | POA: Diagnosis not present

## 2022-11-18 DIAGNOSIS — N761 Subacute and chronic vaginitis: Secondary | ICD-10-CM | POA: Diagnosis not present

## 2022-11-18 DIAGNOSIS — Z0130 Encounter for examination of blood pressure without abnormal findings: Secondary | ICD-10-CM | POA: Diagnosis not present

## 2022-11-18 DIAGNOSIS — N390 Urinary tract infection, site not specified: Secondary | ICD-10-CM | POA: Diagnosis not present

## 2022-11-18 DIAGNOSIS — R32 Unspecified urinary incontinence: Secondary | ICD-10-CM | POA: Diagnosis not present

## 2022-11-18 DIAGNOSIS — G43909 Migraine, unspecified, not intractable, without status migrainosus: Secondary | ICD-10-CM | POA: Diagnosis not present

## 2022-11-18 DIAGNOSIS — I10 Essential (primary) hypertension: Secondary | ICD-10-CM | POA: Diagnosis not present

## 2022-11-18 DIAGNOSIS — Z1331 Encounter for screening for depression: Secondary | ICD-10-CM | POA: Diagnosis not present

## 2022-11-18 DIAGNOSIS — Z1389 Encounter for screening for other disorder: Secondary | ICD-10-CM | POA: Diagnosis not present

## 2022-11-19 ENCOUNTER — Telehealth: Payer: Self-pay | Admitting: Emergency Medicine

## 2022-11-19 NOTE — Telephone Encounter (Signed)
Pt called and inquired about work excuse being sent through [employer] as not able to make it in to Clinic to pick up printed copy.   Work excuse sent through [employer]. Pt aware.

## 2022-11-27 DIAGNOSIS — F3181 Bipolar II disorder: Secondary | ICD-10-CM | POA: Diagnosis not present

## 2022-11-27 DIAGNOSIS — F6381 Intermittent explosive disorder: Secondary | ICD-10-CM | POA: Diagnosis not present

## 2022-11-27 DIAGNOSIS — F4312 Post-traumatic stress disorder, chronic: Secondary | ICD-10-CM | POA: Diagnosis not present

## 2022-12-05 DIAGNOSIS — F4312 Post-traumatic stress disorder, chronic: Secondary | ICD-10-CM | POA: Diagnosis not present

## 2022-12-05 DIAGNOSIS — F6381 Intermittent explosive disorder: Secondary | ICD-10-CM | POA: Diagnosis not present

## 2022-12-05 DIAGNOSIS — F3181 Bipolar II disorder: Secondary | ICD-10-CM | POA: Diagnosis not present

## 2022-12-12 DIAGNOSIS — Z419 Encounter for procedure for purposes other than remedying health state, unspecified: Secondary | ICD-10-CM | POA: Diagnosis not present

## 2022-12-28 DIAGNOSIS — F3181 Bipolar II disorder: Secondary | ICD-10-CM | POA: Diagnosis not present

## 2022-12-28 DIAGNOSIS — F6381 Intermittent explosive disorder: Secondary | ICD-10-CM | POA: Diagnosis not present

## 2022-12-28 DIAGNOSIS — F4312 Post-traumatic stress disorder, chronic: Secondary | ICD-10-CM | POA: Diagnosis not present

## 2023-01-07 DIAGNOSIS — J0100 Acute maxillary sinusitis, unspecified: Secondary | ICD-10-CM | POA: Diagnosis not present

## 2023-01-07 DIAGNOSIS — N390 Urinary tract infection, site not specified: Secondary | ICD-10-CM | POA: Diagnosis not present

## 2023-01-10 DIAGNOSIS — Z419 Encounter for procedure for purposes other than remedying health state, unspecified: Secondary | ICD-10-CM | POA: Diagnosis not present

## 2023-01-14 DIAGNOSIS — F4312 Post-traumatic stress disorder, chronic: Secondary | ICD-10-CM | POA: Diagnosis not present

## 2023-01-14 DIAGNOSIS — F3181 Bipolar II disorder: Secondary | ICD-10-CM | POA: Diagnosis not present

## 2023-01-14 DIAGNOSIS — F6381 Intermittent explosive disorder: Secondary | ICD-10-CM | POA: Diagnosis not present

## 2023-02-10 DIAGNOSIS — Z419 Encounter for procedure for purposes other than remedying health state, unspecified: Secondary | ICD-10-CM | POA: Diagnosis not present

## 2023-02-11 DIAGNOSIS — F6381 Intermittent explosive disorder: Secondary | ICD-10-CM | POA: Diagnosis not present

## 2023-02-11 DIAGNOSIS — F3181 Bipolar II disorder: Secondary | ICD-10-CM | POA: Diagnosis not present

## 2023-02-11 DIAGNOSIS — F4312 Post-traumatic stress disorder, chronic: Secondary | ICD-10-CM | POA: Diagnosis not present

## 2023-02-24 DIAGNOSIS — F6381 Intermittent explosive disorder: Secondary | ICD-10-CM | POA: Diagnosis not present

## 2023-02-24 DIAGNOSIS — F4312 Post-traumatic stress disorder, chronic: Secondary | ICD-10-CM | POA: Diagnosis not present

## 2023-02-24 DIAGNOSIS — F3181 Bipolar II disorder: Secondary | ICD-10-CM | POA: Diagnosis not present

## 2023-03-03 DIAGNOSIS — F4312 Post-traumatic stress disorder, chronic: Secondary | ICD-10-CM | POA: Diagnosis not present

## 2023-03-03 DIAGNOSIS — F3181 Bipolar II disorder: Secondary | ICD-10-CM | POA: Diagnosis not present

## 2023-03-03 DIAGNOSIS — F6381 Intermittent explosive disorder: Secondary | ICD-10-CM | POA: Diagnosis not present

## 2023-03-08 ENCOUNTER — Emergency Department (HOSPITAL_COMMUNITY)
Admission: EM | Admit: 2023-03-08 | Discharge: 2023-03-09 | Disposition: A | Discharge status: Other Acute Inpatient Hospital | Payer: PRIVATE HEALTH INSURANCE | Attending: Emergency Medicine | Admitting: Emergency Medicine

## 2023-03-08 ENCOUNTER — Encounter (HOSPITAL_COMMUNITY): Payer: Self-pay

## 2023-03-08 ENCOUNTER — Other Ambulatory Visit: Payer: Self-pay

## 2023-03-08 DIAGNOSIS — D649 Anemia, unspecified: Secondary | ICD-10-CM

## 2023-03-08 DIAGNOSIS — R5381 Other malaise: Secondary | ICD-10-CM | POA: Diagnosis not present

## 2023-03-08 DIAGNOSIS — R000 Tachycardia, unspecified: Secondary | ICD-10-CM | POA: Diagnosis not present

## 2023-03-08 DIAGNOSIS — F332 Major depressive disorder, recurrent severe without psychotic features: Secondary | ICD-10-CM | POA: Diagnosis not present

## 2023-03-08 DIAGNOSIS — T1491XA Suicide attempt, initial encounter: Secondary | ICD-10-CM | POA: Diagnosis not present

## 2023-03-08 DIAGNOSIS — F29 Unspecified psychosis not due to a substance or known physiological condition: Secondary | ICD-10-CM | POA: Diagnosis not present

## 2023-03-08 DIAGNOSIS — T50902A Poisoning by unspecified drugs, medicaments and biological substances, intentional self-harm, initial encounter: Secondary | ICD-10-CM

## 2023-03-08 DIAGNOSIS — T481X2A Poisoning by skeletal muscle relaxants [neuromuscular blocking agents], intentional self-harm, initial encounter: Secondary | ICD-10-CM | POA: Diagnosis not present

## 2023-03-08 DIAGNOSIS — R7309 Other abnormal glucose: Secondary | ICD-10-CM

## 2023-03-08 DIAGNOSIS — Z743 Need for continuous supervision: Secondary | ICD-10-CM | POA: Diagnosis not present

## 2023-03-08 LAB — CBC WITH DIFFERENTIAL/PLATELET
Abs Immature Granulocytes: 0.04 10*3/uL (ref 0.00–0.07)
Basophils Absolute: 0.1 10*3/uL (ref 0.0–0.1)
Basophils Relative: 1 %
Eosinophils Absolute: 0.2 10*3/uL (ref 0.0–0.5)
Eosinophils Relative: 2 %
HCT: 35.8 % — ABNORMAL LOW (ref 36.0–46.0)
Hemoglobin: 11.7 g/dL — ABNORMAL LOW (ref 12.0–15.0)
Immature Granulocytes: 0 %
Lymphocytes Relative: 30 %
Lymphs Abs: 3.2 10*3/uL (ref 0.7–4.0)
MCH: 31 pg (ref 26.0–34.0)
MCHC: 32.7 g/dL (ref 30.0–36.0)
MCV: 95 fL (ref 80.0–100.0)
Monocytes Absolute: 1.1 10*3/uL — ABNORMAL HIGH (ref 0.1–1.0)
Monocytes Relative: 10 %
Neutro Abs: 5.8 10*3/uL (ref 1.7–7.7)
Neutrophils Relative %: 57 %
Platelets: 379 10*3/uL (ref 150–400)
RBC: 3.77 MIL/uL — ABNORMAL LOW (ref 3.87–5.11)
RDW: 13.1 % (ref 11.5–15.5)
WBC: 10.4 10*3/uL (ref 4.0–10.5)
nRBC: 0 % (ref 0.0–0.2)

## 2023-03-08 LAB — COMPREHENSIVE METABOLIC PANEL
ALT: 13 U/L (ref 0–44)
AST: 15 U/L (ref 15–41)
Albumin: 3.6 g/dL (ref 3.5–5.0)
Alkaline Phosphatase: 65 U/L (ref 38–126)
Anion gap: 9 (ref 5–15)
BUN: 13 mg/dL (ref 6–20)
CO2: 24 mmol/L (ref 22–32)
Calcium: 8.5 mg/dL — ABNORMAL LOW (ref 8.9–10.3)
Chloride: 105 mmol/L (ref 98–111)
Creatinine, Ser: 0.61 mg/dL (ref 0.44–1.00)
GFR, Estimated: >60 mL/min (ref >60)
Glucose, Bld: 121 mg/dL — ABNORMAL HIGH (ref 70–99)
Potassium: 3.6 mmol/L (ref 3.5–5.1)
Sodium: 138 mmol/L (ref 135–145)
Total Bilirubin: 0.3 mg/dL (ref 0.3–1.2)
Total Protein: 6.6 g/dL (ref 6.5–8.1)

## 2023-03-08 LAB — RAPID URINE DRUG SCREEN, HOSP PERFORMED
Amphetamines: NOT DETECTED
Barbiturates: POSITIVE — AB
Benzodiazepines: NOT DETECTED
Cocaine: NOT DETECTED
Opiates: NOT DETECTED
Tetrahydrocannabinol: NOT DETECTED

## 2023-03-08 LAB — ETHANOL: Alcohol, Ethyl (B): <10 mg/dL (ref <10)

## 2023-03-08 LAB — PREGNANCY, URINE: Preg Test, Ur: NEGATIVE

## 2023-03-08 LAB — ACETAMINOPHEN LEVEL: Acetaminophen (Tylenol), Serum: <10 ug/mL — ABNORMAL LOW (ref 10–30)

## 2023-03-08 LAB — SALICYLATE LEVEL: Salicylate Lvl: <7 mg/dL — ABNORMAL LOW (ref 7.0–30.0)

## 2023-03-08 MED ORDER — SODIUM CHLORIDE 0.9 % IV BOLUS
500.0000 mL | Freq: Once | INTRAVENOUS | Status: AC
  Administered 2023-03-08: 500 mL via INTRAVENOUS
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
## 2023-03-08 NOTE — ED Notes (Signed)
Pt has been dressed out and changed into scrubs. Pt gave a urine sample already. Pt is placed on monitor until medically cleared. Pt's belongings are placed in a locker. (Shirt, pants, socks, shoes and jewelry placed in a cup.)   Security has wanded pt. Pt resting at this time.

## 2023-03-08 NOTE — ED Notes (Addendum)
Pt wanded by security. 

## 2023-03-08 NOTE — ED Provider Notes (Incomplete)
Care assumed from Dr. Aileen Pilot, patient with overdose of cyclobenzaprine. Per Poison Control, will be medically cleared at 03:00, will need TTS consultation at that point.  03:00 AM Patient is awake, alert, hemodynamically stable.  She is medically cleared for psychiatric evaluation.  I have requested TTS consultation.   [name], MD 03/09/23 0335   TTS consultation is appreciated.  Patient meets inpatient criteria but apparently told the social worker that she did not want to be admitted.  I talked with the patient explaining that if she refused admission she would be placed under involuntary commitment.  She is now willing to be admitted.   [name], MD 03/09/23 [phone]

## 2023-03-08 NOTE — ED Provider Notes (Signed)
[place] EMERGENCY DEPARTMENT AT [facility] Provider Note   CSN: [id number] Arrival date & time: 03/08/23  2102     History {Add pertinent medical, surgical, social history, OB history to HPI:1} Chief Complaint  Patient presents with   Drug Overdose    Amber Nolan is a 42 y.o. female.  Patient has a history of migraines and depression.  She was in an argument with her husband and she took about 10-15 Flexeril tablets which were about 10 mg.  Patient states she does not want to kill self she was just angry  The history is provided by the patient and medical records. No language interpreter was used.  Drug Overdose This is a new problem. The current episode started less than 1 hour ago. The problem occurs rarely. The problem has been resolved. Pertinent negatives include no chest pain, no abdominal pain and no headaches. Nothing aggravates the symptoms. Nothing relieves the symptoms. She has tried nothing for the symptoms. The treatment provided no relief.       Home Medications Prior to Admission medications   Medication Sig Start Date End Date Taking? Authorizing Provider  amoxicillin (AMOXIL) 875 MG tablet Take 1 tablet (875 mg total) by mouth 2 (two) times daily. 11/12/22   Wallis Bamberg, PA-C  benzonatate (TESSALON) 100 MG capsule Take 1-2 capsules (100-200 mg total) by mouth 3 (three) times daily as needed for cough. 01/15/22   Particia Nearing, PA-C  busPIRone (BUSPAR) 10 MG tablet Take 10 mg by mouth 2 (two) times daily.     [provider]  butalbital-acetaminophen-caffeine (FIORICET) 50-325-40 MG tablet Take 2 tablets by mouth every 4 (four) hours as needed for headache or migraine.  12/29/19   [provider]  cetirizine (ZYRTEC ALLERGY) 10 MG tablet Take 1 tablet (10 mg total) by mouth daily. 11/12/22   Wallis Bamberg, PA-C  citalopram (CELEXA) 40 MG tablet Take 40 mg by mouth daily.    [provider]  cyclobenzaprine  (FLEXERIL) 10 MG tablet Take 10 mg by mouth at bedtime. May take 3 times daily as needed 01/17/20   [provider]  diclofenac (VOLTAREN) 75 MG EC tablet Take 75 mg by mouth 2 (two) times daily.    [provider]  ipratropium (ATROVENT) 0.03 % nasal spray Place 2 sprays into both nostrils 2 (two) times daily. 12/06/21   Wallis Bamberg, PA-C  lidocaine (LIDODERM) 5 % Place 1 patch onto the skin daily. Remove & Discard patch within 12 hours or as directed by MD 05/19/22   Teressa Lower, PA-C  lisinopril-hydrochlorothiazide (ZESTORETIC) 20-25 MG tablet Take 1 tablet by mouth daily. 01/17/20   [provider]  nortriptyline (PAMELOR) 10 MG capsule Take 30 mg by mouth at bedtime. 01/30/20   [provider]  phenazopyridine (PYRIDIUM) 200 MG tablet Take 1 tablet (200 mg total) by mouth 3 (three) times daily as needed for pain. 04/08/22   Gilda Crease, MD  promethazine-dextromethorphan (PROMETHAZINE-DM) 6.25-15 MG/5ML syrup Take 5 mLs by mouth 3 (three) times daily as needed for cough. 11/12/22   Wallis Bamberg, PA-C  pseudoephedrine (SUDAFED) 60 MG tablet Take 1 tablet (60 mg total) by mouth every 8 (eight) hours as needed for congestion. 11/12/22   Wallis Bamberg, PA-C  SUMAtriptan (IMITREX) 50 MG tablet Take 1 tab at onset of migraine. May repeat in 2 hours if headache persists or recurs. Max of 2 tabs daily 07/29/22   Particia Nearing, PA-C  venlafaxine (  EFFEXOR) 75 MG tablet Take 1 tablet by mouth 2 (two) times daily. 09/06/21   [provider]  VICTOZA 18 MG/3ML SOPN Inject into the skin. 02/07/22   [provider]      Allergies    Sulfa antibiotics    Review of Systems   Review of Systems  Constitutional:  Negative for appetite change and fatigue.  HENT:  Negative for congestion, ear discharge and sinus pressure.   Eyes:  Negative for discharge.  Respiratory:  Negative for cough.   Cardiovascular:  Negative for chest pain.   Gastrointestinal:  Negative for abdominal pain and diarrhea.  Genitourinary:  Negative for frequency and hematuria.  Musculoskeletal:  Negative for back pain.  Skin:  Negative for rash.  Neurological:  Negative for seizures and headaches.  Psychiatric/Behavioral:  Positive for dysphoric mood. Negative for hallucinations.     Physical Exam Updated Vital Signs BP (!) 152/96   Pulse 97   Temp 98.5 F (36.9 C)   Resp 18   Ht 5\' 4"  (1.626 m)   Wt 88.5 kg   SpO2 98%   BMI 33.49 kg/m  Physical Exam Vitals reviewed.  Constitutional:      Appearance: She is well-developed.  HENT:     Head: Normocephalic.     Nose: Nose normal.  Eyes:     General: No scleral icterus.    Conjunctiva/sclera: Conjunctivae normal.  Neck:     Thyroid: No thyromegaly.  Cardiovascular:     Rate and Rhythm: Normal rate and regular rhythm.     Heart sounds: No murmur heard.    No friction rub. No gallop.  Pulmonary:     Breath sounds: No stridor. No wheezing or rales.  Chest:     Chest wall: No tenderness.  Abdominal:     General: There is no distension.     Tenderness: There is no abdominal tenderness. There is no rebound.  Musculoskeletal:        General: Normal range of motion.     Cervical back: Neck supple.  Lymphadenopathy:     Cervical: No cervical adenopathy.  Skin:    Findings: No erythema or rash.  Neurological:     Mental Status: She is oriented to person, place, and time.     Motor: No abnormal muscle tone.     Coordination: Coordination normal.  Psychiatric:     Comments: Depressed not suicidal     ED Results / Procedures / Treatments   Labs (all labs ordered are listed, but only abnormal results are displayed) Labs Reviewed  CBC WITH DIFFERENTIAL/PLATELET - Abnormal; Notable for the following components:      Result Value   RBC 3.77 (*)    Hemoglobin 11.7 (*)    HCT 35.8 (*)    Monocytes Absolute 1.1 (*)    All other components within normal limits  COMPREHENSIVE  METABOLIC PANEL - Abnormal; Notable for the following components:   Glucose, Bld 121 (*)    Calcium 8.5 (*)    All other components within normal limits  SALICYLATE LEVEL - Abnormal; Notable for the following components:   Salicylate Lvl <7.0 (*)    All other components within normal limits  ACETAMINOPHEN LEVEL - Abnormal; Notable for the following components:   Acetaminophen (Tylenol), Serum <10 (*)    All other components within normal limits  RAPID URINE DRUG SCREEN, HOSP PERFORMED - Abnormal; Notable for the following components:   Barbiturates POSITIVE (*)    All other components  within normal limits  PREGNANCY, URINE  ETHANOL    EKG EKG Interpretation  Date/Time:  Saturday March 08 2023 21:19:28 EDT Ventricular Rate:  100 PR Interval:  173 QRS Duration: 96 QT Interval:  348 QTC Calculation: 449 R Axis:   52 Text Interpretation: Sinus tachycardia Confirmed by Bethann Berkshire [phone]) on 03/08/2023 10:35:28 PM  Radiology No results found.  Procedures Procedures  {Document cardiac monitor, telemetry assessment procedure when appropriate:1}  Medications Ordered in ED Medications  sodium chloride 0.9 % bolus 500 mL (500 mLs Intravenous New Bag/Given 03/08/23 2231)    ED Course/ Medical Decision Making/ A&P    Patient with overdose of Flexeril.  Poison control has been called.  Respiratory depression is the major problem with Flexeril.  Patient will be medically cleared at 3 AM if she does not have significant problems {    CRITICAL CARE Performed by: Bethann Berkshire Total critical care time: 40 minutes Critical care time was exclusive of separately billable procedures and treating other patients. Critical care was necessary to treat or prevent imminent or life-threatening deterioration. Critical care was time spent personally by me on the following activities: development of treatment plan with patient and/or surrogate as well as nursing, discussions with consultants,  evaluation of patient's response to treatment, examination of patient, obtaining history from patient or surrogate, ordering and performing treatments and interventions, ordering and review of laboratory studies, ordering and review of radiographic studies, pulse oximetry and re-evaluation of patient's condition.  Click here for ABCD2, HEART and other calculatorsREFRESH Note before signing :1}                          Medical Decision Making Amount and/or Complexity of Data Reviewed Labs: ordered. ECG/medicine tests: ordered.   Overdose of Flexeril  {Document critical care time when appropriate:1} {Document review of labs and clinical decision tools ie heart score, Chads2Vasc2 etc:1}  {Document your independent review of radiology images, and any outside records:1} {Document your discussion with family members, caretakers, and with consultants:1} {Document social determinants of health affecting pt's care:1} {Document your decision making why or why not admission, treatments were needed:1} Final Clinical Impression(s) / ED Diagnoses Final diagnoses:  None    Rx / DC Orders ED Discharge Orders     None

## 2023-03-09 ENCOUNTER — Encounter (HOSPITAL_COMMUNITY): Payer: Self-pay | Admitting: Nurse Practitioner

## 2023-03-09 ENCOUNTER — Inpatient Hospital Stay (HOSPITAL_COMMUNITY)
Admission: AD | Admit: 2023-03-09 | Discharge: 2023-03-12 | DRG: 885 | Disposition: A | Discharge status: Home / Self Care | Payer: Medicaid Other | Source: Intra-hospital | Attending: Psychiatry | Admitting: Psychiatry

## 2023-03-09 DIAGNOSIS — Z79899 Other long term (current) drug therapy: Secondary | ICD-10-CM | POA: Diagnosis not present

## 2023-03-09 DIAGNOSIS — I10 Essential (primary) hypertension: Secondary | ICD-10-CM | POA: Diagnosis present

## 2023-03-09 DIAGNOSIS — Z818 Family history of other mental and behavioral disorders: Secondary | ICD-10-CM

## 2023-03-09 DIAGNOSIS — Z5986 Financial insecurity: Secondary | ICD-10-CM

## 2023-03-09 DIAGNOSIS — T481X2A Poisoning by skeletal muscle relaxants [neuromuscular blocking agents], intentional self-harm, initial encounter: Secondary | ICD-10-CM | POA: Diagnosis not present

## 2023-03-09 DIAGNOSIS — K30 Functional dyspepsia: Secondary | ICD-10-CM | POA: Diagnosis present

## 2023-03-09 DIAGNOSIS — F515 Nightmare disorder: Secondary | ICD-10-CM | POA: Diagnosis present

## 2023-03-09 DIAGNOSIS — F314 Bipolar disorder, current episode depressed, severe, without psychotic features: Secondary | ICD-10-CM | POA: Diagnosis not present

## 2023-03-09 DIAGNOSIS — F332 Major depressive disorder, recurrent severe without psychotic features: Secondary | ICD-10-CM | POA: Diagnosis present

## 2023-03-09 DIAGNOSIS — T43212A Poisoning by selective serotonin and norepinephrine reuptake inhibitors, intentional self-harm, initial encounter: Secondary | ICD-10-CM | POA: Diagnosis present

## 2023-03-09 DIAGNOSIS — Z5941 Food insecurity: Secondary | ICD-10-CM

## 2023-03-09 DIAGNOSIS — T43012A Poisoning by tricyclic antidepressants, intentional self-harm, initial encounter: Secondary | ICD-10-CM | POA: Diagnosis present

## 2023-03-09 DIAGNOSIS — F411 Generalized anxiety disorder: Secondary | ICD-10-CM | POA: Insufficient documentation

## 2023-03-09 DIAGNOSIS — K5900 Constipation, unspecified: Secondary | ICD-10-CM | POA: Diagnosis present

## 2023-03-09 DIAGNOSIS — F4310 Post-traumatic stress disorder, unspecified: Secondary | ICD-10-CM | POA: Diagnosis not present

## 2023-03-09 DIAGNOSIS — M549 Dorsalgia, unspecified: Secondary | ICD-10-CM | POA: Diagnosis present

## 2023-03-09 DIAGNOSIS — G43109 Migraine with aura, not intractable, without status migrainosus: Secondary | ICD-10-CM | POA: Diagnosis not present

## 2023-03-09 DIAGNOSIS — Z419 Encounter for procedure for purposes other than remedying health state, unspecified: Secondary | ICD-10-CM | POA: Diagnosis not present

## 2023-03-09 DIAGNOSIS — T43592A Poisoning by other antipsychotics and neuroleptics, intentional self-harm, initial encounter: Secondary | ICD-10-CM | POA: Diagnosis not present

## 2023-03-09 DIAGNOSIS — G8929 Other chronic pain: Secondary | ICD-10-CM | POA: Diagnosis present

## 2023-03-09 DIAGNOSIS — Z8711 Personal history of peptic ulcer disease: Secondary | ICD-10-CM | POA: Diagnosis not present

## 2023-03-09 DIAGNOSIS — Z9151 Personal history of suicidal behavior: Secondary | ICD-10-CM | POA: Diagnosis not present

## 2023-03-09 DIAGNOSIS — G4700 Insomnia, unspecified: Secondary | ICD-10-CM | POA: Diagnosis present

## 2023-03-09 MED ORDER — ACETAMINOPHEN 325 MG PO TABS
650.0000 mg | ORAL_TABLET | ORAL | Status: DC | PRN
  Filled 2023-03-09: qty 2

## 2023-03-09 MED ORDER — BUTALBITAL-APAP-CAFFEINE 50-325-40 MG PO TABS: 1.0000 | ORAL_TABLET | ORAL | Status: DC | PRN

## 2023-03-09 MED ORDER — DIPHENHYDRAMINE HCL 50 MG/ML IJ SOLN: 50.0000 mg | Freq: Three times a day (TID) | INTRAMUSCULAR | Status: DC | PRN

## 2023-03-09 MED ORDER — NORTRIPTYLINE HCL 25 MG PO CAPS
25.0000 mg | ORAL_CAPSULE | Freq: Every day | ORAL | Status: DC
  Filled 2023-03-09: qty 1

## 2023-03-09 MED ORDER — ARIPIPRAZOLE 5 MG PO TABS: 5.0000 mg | ORAL_TABLET | Freq: Every day | ORAL | Status: DC

## 2023-03-09 MED ORDER — BUSPIRONE HCL 5 MG PO TABS: 15.0000 mg | ORAL_TABLET | Freq: Two times a day (BID) | ORAL | Status: DC | PRN

## 2023-03-09 MED ORDER — HYDROCHLOROTHIAZIDE 25 MG PO TABS
25.0000 mg | ORAL_TABLET | Freq: Every day | ORAL | Status: DC
  Administered 2023-03-09: 25 mg via ORAL
  Filled 2023-03-09: qty 1

## 2023-03-09 MED ORDER — HALOPERIDOL LACTATE 5 MG/ML IJ SOLN: 5.0000 mg | Freq: Three times a day (TID) | INTRAMUSCULAR | Status: DC | PRN

## 2023-03-09 MED ORDER — CYCLOBENZAPRINE HCL 10 MG PO TABS
10.0000 mg | ORAL_TABLET | Freq: Every day | ORAL | Status: DC
  Administered 2023-03-09: 10 mg via ORAL
  Filled 2023-03-09 (×4): qty 1

## 2023-03-09 MED ORDER — ARIPIPRAZOLE 5 MG PO TABS
20.0000 mg | ORAL_TABLET | Freq: Every day | ORAL | Status: DC
  Administered 2023-03-09: 20 mg via ORAL
  Filled 2023-03-09: qty 4

## 2023-03-09 MED ORDER — VENLAFAXINE HCL 75 MG PO TABS
75.0000 mg | ORAL_TABLET | Freq: Two times a day (BID) | ORAL | Status: DC
  Administered 2023-03-09: 75 mg via ORAL
  Filled 2023-03-09: qty 1

## 2023-03-09 MED ORDER — ARIPIPRAZOLE 10 MG PO TABS
20.0000 mg | ORAL_TABLET | Freq: Every day | ORAL | Status: DC
  Administered 2023-03-10 – 2023-03-12 (×3): 20 mg via ORAL
  Filled 2023-03-09 (×4): qty 2

## 2023-03-09 MED ORDER — VENLAFAXINE HCL ER 150 MG PO CP24
150.0000 mg | ORAL_CAPSULE | Freq: Every day | ORAL | Status: DC
  Administered 2023-03-10 – 2023-03-12 (×3): 150 mg via ORAL
  Filled 2023-03-09 (×4): qty 1

## 2023-03-09 MED ORDER — LORATADINE 10 MG PO TABS
10.0000 mg | ORAL_TABLET | Freq: Every day | ORAL | Status: DC
  Administered 2023-03-10 – 2023-03-12 (×3): 10 mg via ORAL
  Filled 2023-03-09 (×4): qty 1

## 2023-03-09 MED ORDER — LORAZEPAM 2 MG/ML IJ SOLN: 2.0000 mg | Freq: Three times a day (TID) | INTRAMUSCULAR | Status: DC | PRN

## 2023-03-09 MED ORDER — HALOPERIDOL 5 MG PO TABS: 5.0000 mg | ORAL_TABLET | Freq: Three times a day (TID) | ORAL | Status: DC | PRN

## 2023-03-09 MED ORDER — VENLAFAXINE HCL ER 37.5 MG PO CP24: 150.0000 mg | ORAL_CAPSULE | Freq: Every day | ORAL | Status: DC

## 2023-03-09 MED ORDER — LISINOPRIL-HYDROCHLOROTHIAZIDE 20-25 MG PO TABS: 1.0000 | ORAL_TABLET | Freq: Every day | ORAL | Status: DC

## 2023-03-09 MED ORDER — ACETAMINOPHEN 325 MG PO TABS
650.0000 mg | ORAL_TABLET | Freq: Four times a day (QID) | ORAL | Status: DC | PRN
  Administered 2023-03-09: 650 mg via ORAL
  Filled 2023-03-09: qty 2

## 2023-03-09 MED ORDER — NORTRIPTYLINE HCL 10 MG PO CAPS
30.0000 mg | ORAL_CAPSULE | Freq: Every day | ORAL | Status: DC
  Filled 2023-03-09: qty 3

## 2023-03-09 MED ORDER — LORAZEPAM 1 MG PO TABS: 2.0000 mg | ORAL_TABLET | Freq: Three times a day (TID) | ORAL | Status: DC | PRN

## 2023-03-09 MED ORDER — CITALOPRAM HYDROBROMIDE 20 MG PO TABS
40.0000 mg | ORAL_TABLET | Freq: Every day | ORAL | Status: DC
  Filled 2023-03-09: qty 2

## 2023-03-09 MED ORDER — LORATADINE 10 MG PO TABS
10.0000 mg | ORAL_TABLET | Freq: Every day | ORAL | Status: DC
  Administered 2023-03-09: 10 mg via ORAL
  Filled 2023-03-09: qty 1

## 2023-03-09 MED ORDER — BUSPIRONE HCL 5 MG PO TABS
10.0000 mg | ORAL_TABLET | Freq: Two times a day (BID) | ORAL | Status: DC
  Administered 2023-03-09: 10 mg via ORAL
  Filled 2023-03-09: qty 2

## 2023-03-09 MED ORDER — BUTALBITAL-APAP-CAFFEINE 50-325-40 MG PO TABS
1.0000 | ORAL_TABLET | Freq: Once | ORAL | Status: AC
  Administered 2023-03-09: 1 via ORAL
  Filled 2023-03-09: qty 1

## 2023-03-09 MED ORDER — IBUPROFEN 400 MG PO TABS: 600.0000 mg | ORAL_TABLET | Freq: Two times a day (BID) | ORAL | Status: DC | PRN

## 2023-03-09 MED ORDER — NORTRIPTYLINE HCL 25 MG PO CAPS
25.0000 mg | ORAL_CAPSULE | Freq: Every day | ORAL | Status: DC
  Administered 2023-03-09: 25 mg via ORAL
  Filled 2023-03-09 (×4): qty 1

## 2023-03-09 MED ORDER — DIPHENHYDRAMINE HCL 25 MG PO CAPS: 50.0000 mg | ORAL_CAPSULE | Freq: Three times a day (TID) | ORAL | Status: DC | PRN

## 2023-03-09 MED ORDER — MAGNESIUM HYDROXIDE 400 MG/5ML PO SUSP: 30.0000 mL | Freq: Every day | ORAL | Status: DC | PRN

## 2023-03-09 MED ORDER — HYDROXYZINE HCL 25 MG PO TABS
25.0000 mg | ORAL_TABLET | Freq: Three times a day (TID) | ORAL | Status: DC | PRN
  Administered 2023-03-09: 25 mg via ORAL
  Filled 2023-03-09: qty 1

## 2023-03-09 MED ORDER — ONDANSETRON HCL 4 MG PO TABS: 4.0000 mg | ORAL_TABLET | Freq: Three times a day (TID) | ORAL | Status: DC | PRN

## 2023-03-09 MED ORDER — ALUM & MAG HYDROXIDE-SIMETH 200-200-20 MG/5ML PO SUSP: 30.0000 mL | ORAL | Status: DC | PRN

## 2023-03-09 MED ORDER — ALUM & MAG HYDROXIDE-SIMETH 200-200-20 MG/5ML PO SUSP: 30.0000 mL | Freq: Four times a day (QID) | ORAL | Status: DC | PRN

## 2023-03-09 MED ORDER — LISINOPRIL 10 MG PO TABS
20.0000 mg | ORAL_TABLET | Freq: Every day | ORAL | Status: DC
  Administered 2023-03-09: 20 mg via ORAL
  Filled 2023-03-09: qty 2

## 2023-03-09 MED ORDER — ARIPIPRAZOLE 15 MG PO TABS: 15.0000 mg | ORAL_TABLET | Freq: Every day | ORAL | Status: DC

## 2023-03-09 MED ORDER — TRAZODONE HCL 50 MG PO TABS
50.0000 mg | ORAL_TABLET | Freq: Every evening | ORAL | Status: DC | PRN
  Administered 2023-03-09 – 2023-03-11 (×2): 50 mg via ORAL
  Filled 2023-03-09 (×2): qty 1

## 2023-03-09 MED ORDER — DICLOFENAC SODIUM 75 MG PO TBEC
75.0000 mg | DELAYED_RELEASE_TABLET | Freq: Two times a day (BID) | ORAL | Status: DC
  Administered 2023-03-09 – 2023-03-12 (×6): 75 mg via ORAL
  Filled 2023-03-09 (×10): qty 1

## 2023-03-09 NOTE — ED Notes (Signed)
Patient visualized resting, respirations even and unlabored. 1:1 safety sitter at bedside for observation.

## 2023-03-09 NOTE — BH Assessment (Signed)
During TTS assessment, Pt gave consent to speak with her husband, Enora Trillo [phone]. He confirmed that Pt has been under stress recently. He says he feels some responsibility for her actions last night stating, "I've been a dick to her for the past couple of weeks" and that he should have known how she would react. TTS informed him that she has been medically cleared and the recommendation is inpatient psychiatric treatment with plan to transfer to [facility].   Pamalee Leyden, [facility], [facility] Triage Specialist [phone]

## 2023-03-09 NOTE — ED Notes (Signed)
Pharmacy message for verification of Zestoretic.

## 2023-03-09 NOTE — Progress Notes (Signed)
Pt admitted voluntarily from [facility] after ingesting 10-15 Flexeril following verbal altercation with her husband.  Pt says that she has been diagnosed with Bipolar 2 disorder and Intermittent Explosive Disorder.  Pt has several psychosocial stressors in her life: 5 children, many pets (dogs, birds,rabbit, lizard and chickens), husband is unemployed applying for disability, significant financial issues and pt is working 70 hours per week.    Pt denies substance use.  Pt's UDS positive for barbiturates.  Pt denied SI/HI/AVH on admission at [facility],  Pt sees providers at [facility] and recently started going to trauma therapy.  Pt signed admission forms and verbalized plan of care.

## 2023-03-09 NOTE — [facility] Group Notes (Signed)
Adult Psychoeducational Group Note  Date:  03/09/2023 Time:  8:30 PM  Group Topic/Focus:  Wrap-Up Group:   The focus of this group is to help patients review their daily goal of treatment and discuss progress on daily workbooks.  Participation Level:  Active  Participation Quality:  Appropriate  Affect:  Appropriate  Cognitive:  Appropriate  Insight: Appropriate  Engagement in Group:  Engaged  Modes of Intervention:  Discussion  Additional Comments:  Pt attended wrap-up group. Pt was able to rate their day, describe something positive today and set goal for tomorrow.   

## 2023-03-09 NOTE — ED Notes (Signed)
Transport here to take to [facility]  No changes.  Pt coorperatve

## 2023-03-09 NOTE — ED Notes (Signed)
Pt is medically cleared per EDP. 

## 2023-03-09 NOTE — BH Assessment (Addendum)
Per Edythe Clarity, AC at [facility], Pt has been accepted to [facility] bed 302-1, accepting [name] NP and attending [place] MD. Voluntary consent for treatment has been faxed to [facility]. Number for RN report is [phone].  Admission is pending unit discharge and day shift AC will arrange transfer time.   Pamalee Leyden, [facility], [facility] Triage Specialist [phone]

## 2023-03-09 NOTE — ED Notes (Signed)
Pt has bed at [facility].  Called Will to confirmed and he states room ready and he has already been given report

## 2023-03-09 NOTE — Tx Team (Signed)
Initial Treatment Plan 03/09/2023 4:40 PM Gaylene Giovannina Mun ZOX:[id number]    PATIENT STRESSORS: Financial difficulties   Marital or family conflict     PATIENT STRENGTHS: Communication skills    PATIENT IDENTIFIED PROBLEMS: Handle emotions more effectively  Depression  Anxiety                 DISCHARGE CRITERIA:  Ability to meet basic life and health needs Improved stabilization in mood, thinking, and/or behavior  PRELIMINARY DISCHARGE PLAN: Participate in family therapy Return to previous living arrangement  PATIENT/FAMILY INVOLVEMENT: This treatment plan has been presented to and reviewed with the patient, Airika Alkhatib,.The patient has been given the opportunity to ask questions and make suggestions.  Garnette Scheuermann, RN 03/09/2023, 4:40 PM

## 2023-03-09 NOTE — Progress Notes (Signed)
   03/09/23 2209  Psych Admission Type (Psych Patients Only)  Admission Status Voluntary  Psychosocial Assessment  Patient Complaints Anxiety  Eye Contact Fair  Facial Expression Anxious  Affect Appropriate to circumstance  Speech Logical/coherent  Interaction Assertive  Motor Activity Other (Comment) (WDL)  Appearance/Hygiene Unremarkable  Behavior Characteristics Appropriate to situation  Mood Anxious;Pleasant  Thought Process  Coherency WDL  Content WDL  Delusions None reported or observed  Perception WDL  Hallucination None reported or observed  Judgment Poor  Confusion None  Danger to Self  Current suicidal ideation? Denies  Danger to Others  Danger to Others None reported or observed
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
## 2023-03-09 NOTE — ED Notes (Signed)
TTS in progress 

## 2023-03-10 DIAGNOSIS — F411 Generalized anxiety disorder: Secondary | ICD-10-CM | POA: Insufficient documentation

## 2023-03-10 DIAGNOSIS — F4310 Post-traumatic stress disorder, unspecified: Secondary | ICD-10-CM | POA: Insufficient documentation

## 2023-03-10 DIAGNOSIS — F332 Major depressive disorder, recurrent severe without psychotic features: Secondary | ICD-10-CM | POA: Diagnosis not present

## 2023-03-10 DIAGNOSIS — F314 Bipolar disorder, current episode depressed, severe, without psychotic features: Principal | ICD-10-CM | POA: Insufficient documentation

## 2023-03-10 LAB — TSH: TSH: 0.798 u[IU]/mL (ref 0.350–4.500)

## 2023-03-10 MED ORDER — PRAZOSIN HCL 1 MG PO CAPS
1.0000 mg | ORAL_CAPSULE | Freq: Every day | ORAL | Status: DC
  Administered 2023-03-10 – 2023-03-11 (×2): 1 mg via ORAL
  Filled 2023-03-10 (×4): qty 1

## 2023-03-10 MED ORDER — NORTRIPTYLINE HCL 10 MG PO CAPS
10.0000 mg | ORAL_CAPSULE | Freq: Every day | ORAL | Status: AC
  Administered 2023-03-10 – 2023-03-11 (×2): 10 mg via ORAL
  Filled 2023-03-10 (×3): qty 1

## 2023-03-10 MED ORDER — TOPIRAMATE 25 MG PO TABS
25.0000 mg | ORAL_TABLET | Freq: Every day | ORAL | Status: DC
  Administered 2023-03-10 – 2023-03-12 (×3): 25 mg via ORAL
  Filled 2023-03-10 (×5): qty 1

## 2023-03-10 MED ORDER — HYDROCHLOROTHIAZIDE 12.5 MG PO TABS
12.5000 mg | ORAL_TABLET | Freq: Every day | ORAL | Status: DC
  Administered 2023-03-10 – 2023-03-12 (×3): 12.5 mg via ORAL
  Filled 2023-03-10 (×5): qty 1

## 2023-03-10 MED ORDER — LISINOPRIL 10 MG PO TABS
10.0000 mg | ORAL_TABLET | Freq: Every day | ORAL | Status: DC
  Administered 2023-03-10 – 2023-03-12 (×3): 10 mg via ORAL
  Filled 2023-03-10 (×4): qty 1
  Filled 2023-03-10: qty 2

## 2023-03-10 NOTE — H&P (Signed)
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 has found to helpful at home doses. Plan to continue.  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 Will continue to elucidate through serial  interviews. Pt scored 9/10 on the Zanarini borderline rating scale, which is positive for BPD. Pt's explosive outbursts are described as impulsive but building over some time until the outburst occurs.  - Patient will likely benefit from outpatient DBT if found to be BPD  #PTSD -Start Prazosin 1 mg qhs - for PTSD related nightmares  #Anxiety  -Dc home Buspar 15 mg PRN - per pt this med is not helpful (even when she was taking it scheduled)   --  The risks/benefits/side-effects/alternatives to this medication were discussed in detail with the patient and time was given for questions. The patient consents to medication trial.  -- Effexor, Abilify, Prazosin,   -- Metabolic profile and EKG monitoring obtained while on an atypical antipsychotic (BMI: Lipid Panel: HbgA1c: QTc:) 449   -- Encouraged patient to participate in unit milieu and in scheduled group therapies   -- Short Term Goals: Ability to identify changes in lifestyle to reduce recurrence of condition will improve, Ability to verbalize feelings will improve, Ability to disclose and discuss suicidal ideas, Ability to demonstrate self-control will improve, Ability to identify and develop effective coping behaviors will improve, Ability to maintain clinical measurements within normal limits will improve, Compliance with prescribed medications will improve, and Ability to identify triggers associated with substance abuse/mental health issues will improve  -- Long Term Goals: Improvement in symptoms so as ready for discharge   3. Medical Issues Being Addressed:   #Migraines -taper off of home Nortriptyline - decrease from 25 mg qhs to 10 mg qhs for 2 doses, then STOP; concern for development of serotonin syndrome when taken with other serotonergic medications - START Topomax 25 mg once daily     Back pain  -Hold flexeril   #Labs Reviewed/Pending    4. Discharge Planning:  -- Social work and case management to assist with discharge  planning and identification of hospital follow-up needs prior to discharge  -- Estimated LOS: 5-7 days  -- Discharge Concerns: Need to establish a safety plan; Medication compliance and effectiveness  -- Discharge Goals: Return home with outpatient referrals for mental health follow-up including medication management/psychotherapy  The patient is agreeable with the medication plan, as above. We will monitor the patient's response to  pharmacologic treatment, and adjust medications as necessary. Patient is encouraged to participate in group therapy while admitted to the psychiatric unit. We will address other chronic and acute stressors, which contributed to the patient's Bipolar Disorder, IED, GAD, PTSD, in order to reduce the risk of self-harm at discharge.  I certify that inpatient services furnished can reasonably be expected to improve the patient's condition.      Cristy Hilts, MD 4/29/20243:47 PM   Total Time Spent in Direct Patient Care:  I personally spent 60 minutes on the unit in direct patient care. The direct patient care time included face-to-face time with the patient, reviewing the patient's chart, communicating with other professionals, and coordinating care. Greater than 50% of this time was spent in counseling or coordinating care with the patient regarding goals of hospitalization, psycho-education, and discharge planning needs.  I personally was present and performed or re-performed the history, physical exam and medical decision-making activities of this service and have verified that the service and findings are accurately documented in the student's note, , as addended by me or notated below:  I directly edited the note, as above.   Phineas Inches, MD Psychiatrist

## 2023-03-10 NOTE — BHH Suicide Risk Assessment (Signed)
[facility] Admission Suicide Risk Assessment   Nursing information obtained from:  Patient Demographic factors:  Caucasian, Low socioeconomic status Current Mental Status:  NA Loss Factors:  Financial problems / change in socioeconomic status Historical Factors:  Prior suicide attempts, Impulsivity Risk Reduction Factors:  Responsible for children under 43 years of age, Employed, Living with another person, especially a relative  Total Time spent with patient: 15 minutes Principal Problem: MDD (major depressive disorder), recurrent episode, severe (HCC) Diagnosis:  Principal Problem:   MDD (major depressive disorder), recurrent episode, severe (HCC) Active Problems:   Migraine with aura   GAD (generalized anxiety disorder)   PTSD (post-traumatic stress disorder)  Subjective Data: See H&P   Continued Clinical Symptoms:  Alcohol Use Disorder Identification Test Final Score (AUDIT): 1 The "Alcohol Use Disorders Identification Test", Guidelines for Use in Primary Care, Second Edition.  World Science writer [facility]). Score between 0-7:  no or low risk or alcohol related problems. Score between 8-15:  moderate risk of alcohol related problems. Score between 16-19:  high risk of alcohol related problems. Score 20 or above:  warrants further diagnostic evaluation for alcohol dependence and treatment.   CLINICAL FACTORS:   Severe Anxiety and/or Agitation Bipolar Disorder:   Depressive phase Chronic Pain More than one psychiatric diagnosis Unstable or Poor Therapeutic Relationship Previous Psychiatric Diagnoses and Treatments    Psychiatric Specialty Exam:  Presentation  General Appearance:  Casual  Eye Contact: Fair  Speech: Normal Rate  Speech Volume: Normal  Handedness:No data recorded  Mood and Affect  Mood: Anxious; Depressed  Affect: Congruent; Depressed   Thought Process  Thought Processes: Linear  Descriptions of Associations:Intact  Orientation:Full  (Time, Place and Person)  Thought Content:Logical  History of Schizophrenia/Schizoaffective disorder:No  Duration of Psychotic Symptoms:No data recorded Hallucinations:Hallucinations: None  Ideas of Reference:None  Suicidal Thoughts:Suicidal Thoughts: No  Homicidal Thoughts:Homicidal Thoughts: No   Sensorium  Memory: Immediate Good; Recent Good; Remote Good  Judgment: Poor  Insight: Poor   Executive Functions  Concentration: Fair  Attention Span: Fair  Recall: Good  Fund of Knowledge: Good  Language: Good   Psychomotor Activity  Psychomotor Activity: Psychomotor Activity: Normal   Assets  Assets:No data recorded  Sleep  Sleep: Sleep: Poor    Physical Exam: Physical Exam See H&P  ROS See H&P  Blood pressure 139/70, pulse 95, temperature 98.8 F (37.1 C), temperature source Oral, resp. rate 16, height 5\' 4"  (1.626 m), weight 95.3 kg, SpO2 98 %. Body mass index is 36.05 kg/m.   COGNITIVE FEATURES THAT CONTRIBUTE TO RISK:  None    SUICIDE RISK:   Moderate:  Frequent suicidal ideation with limited intensity, and duration, some specificity in terms of plans, no associated intent, good self-control, limited dysphoria/symptomatology, some risk factors present, and identifiable protective factors, including available and accessible social support.  PLAN OF CARE: See H&P   I certify that inpatient services furnished can reasonably be expected to improve the patient's condition.   Cristy Hilts, MD 03/10/2023, 3:45 PM

## 2023-03-10 NOTE — Plan of Care (Signed)
  Problem: Education: Goal: Ability to make informed decisions regarding treatment will improve Outcome: Progressing   

## 2023-03-10 NOTE — BHH Group Notes (Signed)
Patient attended the AA group. 

## 2023-03-10 NOTE — Progress Notes (Signed)
   03/10/23 2000  Psych Admission Type (Psych Patients Only)  Admission Status Voluntary  Psychosocial Assessment  Patient Complaints Anxiety  Eye Contact Fair  Facial Expression Anxious  Affect Appropriate to circumstance  Speech Logical/coherent  Interaction Assertive  Motor Activity Other (Comment)  Appearance/Hygiene Unremarkable  Behavior Characteristics Appropriate to situation  Mood Anxious;Pleasant  Thought Process  Coherency WDL  Content WDL  Delusions None reported or observed  Perception WDL  Hallucination None reported or observed  Judgment Poor  Confusion None  Danger to Self  Current suicidal ideation? Denies  Danger to Others  Danger to Others None reported or observed   Alert/oriented. Makes needs/concerns known to staff. Pleasant cooperative with staff. Denies SI/HI/A/V hallucinations. Patient states went to group. Will encourage continued compliance and progression towards goals. Verbally contracted for safety. Will continue to monitor.

## 2023-03-10 NOTE — BHH Group Notes (Signed)
Adult Psychoeducational Group Note  Date:  03/10/2023 Time:  11:07 AM  Group Topic/Focus:  Goals Group:   The focus of this group is to help patients establish daily goals to achieve during treatment and discuss how the patient can incorporate goal setting into their daily lives to aide in recovery. Orientation:   The focus of this group is to educate the patient on the purpose and policies of crisis stabilization and provide a format to answer questions about their admission.  The group details unit policies and expectations of patients while admitted.  Participation Level:  Active  Participation Quality:  Appropriate  Affect:  Appropriate  Cognitive:  Appropriate  Insight: Appropriate  Engagement in Group:  Engaged  Modes of Intervention:  Discussion  Additional Comments:  Pt attended the orientation/goals group and remained appropriate and engaged throughout the duration of the group.   Fara Olden O 03/10/2023, 11:07 AM

## 2023-03-10 NOTE — Group Note (Signed)
Occupational Therapy Group Note  Group Topic:Coping Skills  Group Date: 03/10/2023 Start Time: 1430 End Time: 1500 Facilitators: Seibert Keeter G, OT   Group Description: Group encouraged increased engagement and participation through discussion and activity focused on "Coping Ahead." Patients were split up into teams and selected a card from a stack of positive coping strategies. Patients were instructed to act out/charade the coping skill for other peers to guess and receive points for their team. Discussion followed with a focus on identifying additional positive coping strategies and patients shared how they were going to cope ahead over the weekend while continuing hospitalization stay.  Therapeutic Goal(s): Identify positive vs negative coping strategies. Identify coping skills to be used during hospitalization vs coping skills outside of hospital/at home Increase participation in therapeutic group environment and promote engagement in treatment   Participation Level: Engaged   Participation Quality: Independent   Behavior: Appropriate   Speech/Thought Process: Relevant   Affect/Mood: Appropriate   Insight: Fair   Judgement: Fair      Modes of Intervention: Education  Patient Response to Interventions:  Engaged   Plan: Continue to engage patient in OT groups 2 - 3x/week.  03/10/2023  Amber Nolan, OT Aleshka Corney, OT   

## 2023-03-10 NOTE — Progress Notes (Signed)
   03/10/23 0800  Psych Admission Type (Psych Patients Only)  Admission Status Voluntary  Psychosocial Assessment  Patient Complaints Anxiety  Eye Contact Fair  Facial Expression Anxious  Affect Appropriate to circumstance  Speech Logical/coherent  Interaction Assertive  Motor Activity Other (Comment) (WDL)  Appearance/Hygiene Unremarkable  Behavior Characteristics Appropriate to situation  Mood Anxious;Pleasant  Thought Process  Coherency WDL  Content WDL  Delusions None reported or observed  Perception WDL  Hallucination None reported or observed  Judgment Poor  Confusion None  Danger to Self  Current suicidal ideation? Denies  Danger to Others  Danger to Others None reported or observed

## 2023-03-10 NOTE — Group Note (Unsigned)
Date:  03/10/2023 Time:  4:29 PM  Group Topic/Focus:  Developing a Wellness Toolbox:   The focus of this group is to help patients develop a "wellness toolbox" with skills and strategies to promote recovery upon discharge.     Participation Level:  {BHH PARTICIPATION ZOXWR:60454}  Participation Quality:  {BHH PARTICIPATION QUALITY:22265}  Affect:  {BHH AFFECT:22266}  Cognitive:  {BHH COGNITIVE:22267}  Insight: {BHH Insight2:20797}  Engagement in Group:  {BHH ENGAGEMENT IN UJWJX:91478}  Modes of Intervention:  {BHH MODES OF INTERVENTION:22269}  Additional Comments:  ***  Amber Nolan 03/10/2023, 4:29 PM

## 2023-03-10 NOTE — Progress Notes (Signed)
Spiritual Resources group surrounding sources of strength facilitated by Chaplain Gayla Medicus  Group Goal: Support / Education around Scientist, product/process development  Members engage in facilitated group support and psycho-social education.  Group Description:  Following introductions and group rules, group members engaged in facilitated group dialogue and support around the topic of strength, with particular support around experiences of not having strength and/or having strength in their lives. Group Identified types of strength that motivate them and identified patterns, circumstances, and changes that included identifying their strength characteristics. Chaplain facilitated group discussion and supported group members contributions and support of one another. Group members were encouraged to individually reflect on their strengths and how they could use support systems to assist in having more strength.   Patient Progress: Amber Nolan attended group and actively engaged and participated in the group conversation and activities.  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 She supported her peers and her comments demonstrated good insight into the topic.   Participation Level:  Active Participation Quality:  Appropriate Insight: Appropriate Engagement in Group:  Engaged Modes of Intervention:  Discussion and Worksheets  Animator

## 2023-03-11 DIAGNOSIS — F332 Major depressive disorder, recurrent severe without psychotic features: Secondary | ICD-10-CM | POA: Diagnosis not present
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
## 2023-03-11 NOTE — Group Note (Signed)
Recreation Therapy Group Note   Group Topic:Animal Assisted Therapy   Group Date: 03/11/2023 Start Time: 0945 End Time: 1025 Facilitators: Jakalyn Kratky, Benito Mccreedy, LRT Location: [address] Dayroom  Animal-Assisted Activity (AAA) Program Checklist/Progress Note Patient Eligibility Criteria Checklist & Daily Group note for Rec Tx Intervention   AAA/T Program Assumption of Risk Form signed by Patient/ or Parent Legal Guardian YES  Patient is free of allergies or severe asthma  YES  Patient reports no fear of animals YES  Patient reports no history of cruelty to animals YES  Patient understands their participation is voluntary YES  Patient washes hands before animal contact YES  Patient washes hands after animal contact YES   Group Description: Patients provided opportunity to interact with trained and credentialed [employer] Therapy dog and the community volunteer/dog handler. Patients practiced appropriate animal interaction and were educated on dog safety outside of the hospital in common community settings. Patients were allowed to use dog toys and other items to practice commands, engage the dog in play, and/or complete routine aspects of animal care.   Education: Charity fundraiser, Health visitor, Communication & Social Skills   Affect/Mood: Appropriate and Congruent   Participation Level: Minimal   Participation Quality: Independent   Behavior: Attentive  and Reserved   Speech/Thought Process: Coherent and Oriented   Modes of Intervention: Activity, Teaching laboratory technician, and Socialization   Patient Response to Interventions:  Minimal interest   Education Outcome:  In group clarification offered    Clinical Observations/Individualized Feedback: Patient attended session and was primarily passive, interacting briefly with the therapy dog and peers. Pt contributed little to group conversations.    Benito Mccreedy Jmari Pelc, LRT, CTRS 03/11/2023 3:05 PM

## 2023-03-11 NOTE — Group Note (Signed)
Date:  03/11/2023 Time:  9:53 PM  Group Topic/Focus:  Wrap-Up Group:   The focus of this group is to help patients review their daily goal of treatment and discuss progress on daily workbooks.    Participation Level:  Active  Participation Quality:  Appropriate  Affect:  Appropriate  Cognitive:  Appropriate  Insight: Appropriate  Engagement in Group:  Engaged  Modes of Intervention:  Education  Additional Comments:  Patient attended and participated in group tonight. She reports that today she talk with her doctor. She is schedule to leave tomorrow.  Lita Mains [facility] 03/11/2023, 9:53 PM

## 2023-03-11 NOTE — Group Note (Signed)
Date:  03/11/2023 Time:  12:13 PM  Group Topic/Focus:  Building Self Esteem:   The Focus of this group is helping patients become aware of the effects of self-esteem on their lives, the things they and others do that enhance or undermine their self-esteem, seeing the relationship between their level of self-esteem and the choices they make and learning ways to enhance self-esteem. Overcoming Stress:   The focus of this group is to define stress and help patients assess their triggers.    Participation Level:  Active  Participation Quality:  Appropriate  Affect:  Appropriate  Cognitive:  Appropriate  Insight: Appropriate  Engagement in Group:  Engaged  Modes of Intervention:  Discussion, Rapport Building, Socialization, and Support  Additional Comments:    Memory Dance Amber Nolan 03/11/2023, 12:13 PM
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
## 2023-03-11 NOTE — Plan of Care (Signed)
  Problem: Education: Goal: Knowledge of the prescribed therapeutic regimen will improve Outcome: Progressing   Problem: Activity: Goal: Interest or engagement in leisure activities will improve Outcome: Progressing   Problem: Medication: Goal: Compliance with prescribed medication regimen will improve Outcome: Progressing

## 2023-03-11 NOTE — Progress Notes (Signed)
  Patient with complaint of trouble sleeping.  Administered PRN Trazodone per [facility] per patient request.

## 2023-03-11 NOTE — Progress Notes (Signed)
    03/11/23 2132  Psych Admission Type (Psych Patients Only)  Admission Status Voluntary  Psychosocial Assessment  Patient Complaints Anxiety;Depression  Eye Contact Fair  Facial Expression Anxious  Affect Appropriate to circumstance  Speech Logical/coherent  Interaction Assertive  Motor Activity Other (Comment) (WDL)  Appearance/Hygiene Unremarkable  Behavior Characteristics Cooperative;Appropriate to situation  Mood Depressed;Anxious  Thought Process  Coherency WDL  Content WDL  Delusions None reported or observed  Perception WDL  Hallucination None reported or observed  Judgment Poor  Confusion None  Danger to Self  Current suicidal ideation? Denies  Danger to Others  Danger to Others None reported or observed

## 2023-03-11 NOTE — Progress Notes (Signed)
   03/11/23 0800  Psych Admission Type (Psych Patients Only)  Admission Status Voluntary  Psychosocial Assessment  Patient Complaints Anxiety;Depression  Eye Contact Fair  Facial Expression Anxious  Affect Appropriate to circumstance  Speech Logical/coherent  Interaction Assertive  Motor Activity Other (Comment) (WDL)  Appearance/Hygiene Unremarkable  Behavior Characteristics Appropriate to situation  Mood Anxious;Pleasant  Thought Process  Coherency WDL  Content WDL  Delusions None reported or observed  Perception WDL  Hallucination None reported or observed  Judgment Poor  Confusion None  Danger to Self  Current suicidal ideation? Denies  Danger to Others  Danger to Others None reported or observed

## 2023-03-11 NOTE — Plan of Care (Signed)
  Problem: Activity: Goal: Interest or engagement in leisure activities will improve Outcome: Progressing Goal: Imbalance in normal sleep/wake cycle will improve Outcome: Progressing   Problem: Coping: Goal: Coping ability will improve Outcome: Progressing Goal: Will verbalize feelings Outcome: Progressing   Problem: Education: Goal: Utilization of techniques to improve thought processes will improve Outcome: Progressing Goal: Knowledge of the prescribed therapeutic regimen will improve Outcome: Progressing

## 2023-03-12 ENCOUNTER — Encounter (HOSPITAL_COMMUNITY): Payer: Self-pay

## 2023-03-12 DIAGNOSIS — F6381 Intermittent explosive disorder: Secondary | ICD-10-CM | POA: Diagnosis not present

## 2023-03-12 DIAGNOSIS — F3181 Bipolar II disorder: Secondary | ICD-10-CM | POA: Diagnosis not present

## 2023-03-12 DIAGNOSIS — F332 Major depressive disorder, recurrent severe without psychotic features: Secondary | ICD-10-CM | POA: Diagnosis not present

## 2023-03-12 DIAGNOSIS — F4312 Post-traumatic stress disorder, chronic: Secondary | ICD-10-CM | POA: Diagnosis not present

## 2023-03-12 DIAGNOSIS — Z419 Encounter for procedure for purposes other than remedying health state, unspecified: Secondary | ICD-10-CM | POA: Diagnosis not present

## 2023-03-12 MED ORDER — PRAZOSIN HCL 1 MG PO CAPS: 1.0000 mg | ORAL_CAPSULE | Freq: Every day | ORAL | 0 refills | Status: AC

## 2023-03-12 MED ORDER — TOPIRAMATE 25 MG PO TABS: 25.0000 mg | ORAL_TABLET | Freq: Every day | ORAL | 0 refills | Status: AC

## 2023-03-12 MED ORDER — ARIPIPRAZOLE 20 MG PO TABS: 20.0000 mg | ORAL_TABLET | Freq: Every day | ORAL | 0 refills | Status: AC

## 2023-03-12 NOTE — [facility] Suicide Risk Assessment (Signed)
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    Psychiatric diagnoses provided upon initial assessment:  #Bipolar II Disorder I Bipolar Depression  #Bipolar II Disorder I Bipolar Depression  PTSD GAD   Patient's psychiatric medications were adjusted on admission:  - Continue home Abilify 20 mg daily AM - pt reports his has been helpful  - Continue home Effexor 150 mg XR for depressive symptoms - pt reports this has been helpful  - Dc buspar PRN - pt reports this is not helpful  -taper off of home Nortriptyline - decrease from 25 mg qhs to 10 mg qhs for 2 doses, then STOP; concern for development of serotonin syndrome when taken with other serotonergic medications - START Topomax 25 mg once daily       During the hospitalization, other adjustments were made to the patient's psychiatric medication regimen:  None    Psychiatric Specialty Exam  Presentation  General Appearance:  Casual; Fairly Groomed  Eye Contact: Good  Speech: Normal Rate; Clear and Coherent  Speech Volume: Normal  Handedness:No data recorded  Mood and Affect  Mood: Euthymic  Duration of Depression Symptoms: Greater than two weeks  Affect: Appropriate; Congruent; Full Range   Thought Process   Thought Processes: Linear  Descriptions of Associations:Intact  Orientation:Full (Time, Place and Person)  Thought Content:Logical  History of Schizophrenia/Schizoaffective disorder:No  Duration of Psychotic Symptoms:No data recorded Hallucinations:Hallucinations: None  Ideas of Reference:None  Suicidal Thoughts:Suicidal Thoughts: No  Homicidal Thoughts:Homicidal Thoughts: No   Sensorium  Memory: Immediate Good; Recent Good; Remote Good  Judgment: Good  Insight: Good   Executive Functions  Concentration: Good  Attention Span: Good  Recall: Good  Fund of Knowledge: Good  Language: Good   Psychomotor Activity  Psychomotor Activity: Psychomotor Activity: Normal   Assets  Assets:No data recorded  Sleep  Sleep: Sleep: Fair   Physical Exam: Physical Exam ROS Blood pressure 101/62, pulse 77, temperature 98.4 F (36.9 C), temperature source Oral, resp. rate 18, height 5\' 4"  (1.626 m), weight 95.3 kg, SpO2 100 %. Body mass index is 36.05 kg/m.  Mental Status Per Nursing Assessment::   On Admission:  NA  Demographic factors:  Caucasian, Low socioeconomic status Loss Factors:  Financial problems / change in socioeconomic status Historical Factors:  Prior suicide attempts, Impulsivity Risk Reduction Factors:  Responsible for children under 22 years of age, Employed, Living with another person, especially a relative    Continued Clinical Symptoms:  Mood is stable. Denying SI.   Cognitive Features That Contribute To Risk:  None    Suicide Risk:  Mild:  There are no identifiable suicide plans, no associated intent, mild dysphoria and related symptoms, good self-control (both objective and subjective assessment), few other risk factors, and identifiable protective factors, including available and accessible social support.    Follow-up Information     [facility] Go on  03/21/2023.   Why: Please arrive for your scheduled  appointment @ 8 am for counseling Contact information: [address] [place] [address] [phone]         [facility] Go on 04/14/2023.   Why: Medication Management @ 800am (Virtual) Contact information: [address], [place], [place] [address] Hours:  Open  Closes 5 Phone: [phone]                Plan Of Care/Follow-up recommendations:   -Follow-up with your outpatient psychiatric provider -instructions on appointment date, time, and address (location) are provided to you in discharge paperwork.   -Take your psychiatric medications as prescribed at discharge - instructions are provided to you in the discharge paperwork   -Follow-up with outpatient primary care doctor and other specialists -for management of preventative medicine and chronic medical disease   -Recommend abstinence from alcohol, tobacco, and other illicit drug use at discharge.    -If your psychiatric symptoms recur, worsen, or if you have side effects to your psychiatric medications, call your outpatient psychiatric provider, 911, 988 or go to the nearest emergency department.   -If suicidal thoughts recur, call your outpatient psychiatric provider, 911, 988 or go to the nearest emergency department.      Cristy Hilts, MD 03/12/2023, 9:35 AM

## 2023-03-12 NOTE — Progress Notes (Signed)
    03/12/23 0547  15 Minute Checks  Location Bedroom  Visual Appearance Calm  Behavior Sleeping  Sleep (Behavioral Health Patients Only)  Calculate sleep? (Click Yes once per 24 hr at 0600 safety check) Yes  Documented sleep last 24 hours 7.75

## 2023-03-12 NOTE — [facility] Suicide Risk Assessment (Signed)
[facility] INPATIENT:  Family/Significant Other Suicide Prevention Education  Suicide Prevention Education:  Education Completed; 03-12-2023,  -Amber Nolan (Husband) [phone] has been identified by the patient as the family member/significant other with whom the patient will be residing, and identified as the person(s) who will aid the patient in the event of a mental health crisis (suicidal ideations/suicide attempt).  With written consent from the patient, the -Amber Nolan (Husband) [phone] been provided the following suicide prevention education, prior to the and/or following the discharge of the patient.  The suicide prevention education provided includes the following: Suicide risk factors Suicide prevention and interventions National Suicide Hotline telephone number [facility] assessment telephone number [facility] Emergency Assistance 911 [facility] and/or Residential Mobile Crisis Unit telephone number  Request made of family/significant other to: Remove weapons (e.g., guns, rifles, knives), all items previously/currently identified as safety concern.   Remove drugs/medications (over-the-counter, prescriptions, illicit drugs), all items previously/currently identified as a safety concern.  -Amber Nolan (Husband) [phone] verbalizes understanding of the suicide prevention education information provided.  The family member/significant other agrees to remove the items of safety concern listed above.  Amber Nolan 03/12/2023, 9:26 AM

## 2023-03-12 NOTE — Discharge Summary (Signed)
Physician Discharge Summary Note  Patient:  Amber Nolan is an 42 y.o., female MRN:  [id number] DOB:  08-06-1981 Patient phone:  [phone] (home)  Patient address:   [address] 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   Principal Problem: Bipolar disorder with severe depression [facility]) Discharge Diagnoses: Principal Problem:   Bipolar disorder with severe depression (HCC) Active Problems:   Migraine with aura   GAD (generalized anxiety disorder)   PTSD (post-traumatic stress disorder)   Past Psychiatric History:  Previous Psych Diagnoses: Intermittent Explosive Disorder, PTSD, Bipolar  II disorder, GAD. Also was dx with borderline in the past but later told she does not have this.  Prior inpatient treatment: psychiatric hospitalization at [facility], attempted overdose, aborted by husband (2015) Current/prior outpatient treatment: Trauma therapist, psychiatrist  Prior rehab hx: none Psychotherapy QM:VHQI  History of suicide: overdose attempt (2015) History of homicide: none  Psychiatric medication history: Abilify, Celexa, Venlafaxine, Buspar Psychiatric medication compliance history: Abilify (current) Celexa(taper off for switch to Effexor), Effexor, Buspar Neuromodulation history: none  Current Psychiatrist: Dr. Pierce Crane, [facility]  Current therapist: Patrecia Pace, [facility]     Past Medical History:  Past Medical History:  Diagnosis Date   Anxiety and  depression    Gastritis    GERD (gastroesophageal reflux disease)    Migraine    Migraines    Scoliosis    Stomach ulcer     Past Surgical History:  Procedure Laterality Date   APPENDECTOMY  1993   CHOLECYSTECTOMY  2014   EXPLORATORY LAPAROTOMY  2015   RHINOPLASTY     TUBAL LIGATION  2013   Family History:  Family History  Problem Relation Age of Onset   Liver cancer Father        LIVER   Diabetes Other    Hypertension Other    Hypothyroidism Other    Stroke Other    Heart disease Other    Bladder Cancer Neg Hx    Prostate cancer Neg Hx    Kidney cancer Neg Hx    Family Psychiatric  History:  Medical:Mother; deceased, aortic aeyurism  Father; deceased, liver cancer  Psych: Mother; depression, anxiety. Sister; Bipolar I  Psych Rx: none  SA/HA: none Substance use family hx: Grandparents, aunt, uncle; alcoholism     Social History:  Social History   Substance and Sexual Activity  Alcohol Use Yes   Comment: Social     Social History   Substance and Sexual Activity  Drug Use No    Social History   Socioeconomic History   Marital status: Married    Spouse name: Not on file   Number of children: 3   Years of education: 12+   Highest education level: Not on file  Occupational History   Occupation: Assembly line    Comment: Chief Technology Officer  Tobacco Use   Smoking status: Never    Passive exposure: Never   Smokeless tobacco: Never  Vaping Use   Vaping Use: Never used  Substance and Sexual Activity   Alcohol  use: Yes    Comment: Social   Drug use: No   Sexual activity: Yes    Birth control/protection: None  Other Topics Concern   Not on file  Social History Narrative   Patient is engaged to [place] and lives to him   Patient has 3 children.    Education level some college.    Caffeine consumption is daily   Social Determinants of Health   Financial Resource Strain: Not on file  Food Insecurity: Food Insecurity Present (03/09/2023)   Hunger Vital  Sign    Worried About Running Out of Food in the Last Year: Sometimes true    Ran Out of Food in the Last Year: Sometimes true  Transportation Needs: No Transportation Needs (03/09/2023)   PRAPARE - Administrator, Civil Service (Medical): No    Lack of Transportation (Non-Medical): No  Physical Activity: Not on file  Stress: Not on file  Social Connections: Not on file    Hospital Course:   During the patient's hospitalization, patient had extensive initial psychiatric evaluation, and follow-up psychiatric evaluations every day.  Psychiatric diagnoses provided upon initial assessment:  #Bipolar II Disorder I Bipolar Depression  #Bipolar II Disorder I Bipolar Depression  PTSD GAD  Patient's psychiatric medications were adjusted on admission:  - Continue home Abilify 20 mg daily AM - pt reports his has been helpful  - Continue home Effexor 150 mg XR for depressive symptoms - pt reports this has been helpful  - Dc buspar PRN - pt reports this is not helpful  -taper off of home Nortriptyline - decrease from 25 mg qhs to 10 mg qhs for 2 doses, then STOP; concern for development of serotonin syndrome when taken with other serotonergic medications - START Topomax 25 mg once daily      During the hospitalization, other adjustments were made to the patient's psychiatric medication regimen:  none  Patient's care was discussed during the interdisciplinary team meeting every day during the hospitalization.  The patient denied having side effects to prescribed psychiatric medication.  Gradually, patient started adjusting to milieu. The patient was evaluated each day by a clinical provider to ascertain response to treatment. Improvement was noted by the patient's report of decreasing symptoms, improved sleep and appetite, affect, medication tolerance, behavior, and participation in unit programming.  Patient was asked each day to complete a self inventory noting mood, mental status,  pain, new symptoms, anxiety and concerns.    Symptoms were reported as significantly decreased or resolved completely by discharge.   On day of discharge, the patient reports that their mood is stable. The patient denied having suicidal thoughts for more than 48 hours prior to discharge.  Patient denies having homicidal thoughts.  Patient denies having auditory hallucinations.  Patient denies any visual hallucinations or other symptoms of psychosis. The patient was motivated to continue taking medication with a goal of continued improvement in mental health.   The patient reports their target psychiatric symptoms of depression, suicidal thoughts, and explosive episodes, have responded well to the psychiatric medications, and the patient reports overall benefit other psychiatric hospitalization. Supportive psychotherapy was provided to the patient. The patient also participated in regular group therapy while hospitalized. Coping skills, problem solving as well as relaxation therapies were also part of the unit programming.  Labs were reviewed with the patient, and abnormal results were discussed with the patient.  The patient is able to verbalize their individual safety plan to this provider.  # It  is recommended to the patient to continue psychiatric medications as prescribed, after discharge from the hospital.    # It is recommended to the patient to follow up with your outpatient psychiatric provider and PCP.  # It was discussed with the patient, the impact of alcohol, drugs, tobacco have been there overall psychiatric and medical wellbeing, and total abstinence from substance use was recommended the patient.ed.  # Prescriptions provided or sent directly to preferred pharmacy at discharge. Patient agreeable to plan. Given opportunity to ask questions. Appears to feel comfortable with discharge.    # In the event of worsening symptoms, the patient is instructed to call the crisis hotline, 911  and or go to the nearest ED for appropriate evaluation and treatment of symptoms. To follow-up with primary care provider for other medical issues, concerns and or health care needs  # Patient was discharged to home, with a plan to follow up as noted below. SW did safety planning with husband on day of dc.   Physical Findings: AIMS:  , ,  ,  ,    CIWA:    COWS:     Musculoskeletal: Strength & Muscle Tone: within normal limits Gait & Station: normal Patient leans: N/A  Aims score zero on my exam. No eps on my exam.   Psychiatric Specialty Exam:  Presentation  General Appearance:  Casual; Fairly Groomed  Eye Contact: Good  Speech: Normal Rate; Clear and Coherent  Speech Volume: Normal  Handedness:No data recorded  Mood and Affect  Mood: Euthymic  Affect: Appropriate; Congruent; Full Range   Thought Process  Thought Processes: Linear  Descriptions of Associations:Intact  Orientation:Full (Time, Place and Person)  Thought Content:Logical  History of Schizophrenia/Schizoaffective disorder:No  Duration of Psychotic Symptoms:No data recorded Hallucinations:Hallucinations: None  Ideas of Reference:None  Suicidal Thoughts:Suicidal Thoughts: No  Homicidal Thoughts:Homicidal Thoughts: No   Sensorium  Memory: Immediate Good; Recent Good; Remote Good  Judgment: Good  Insight: Good   Executive Functions  Concentration: Good  Attention Span: Good  Recall: Good  Fund of Knowledge: Good  Language: Good   Psychomotor Activity  Psychomotor Activity: Psychomotor Activity: Normal   Assets  Assets:No data recorded  Sleep  Sleep: Sleep: Fair    Physical Exam: Physical Exam Vitals reviewed.  Constitutional:      Appearance: She is normal weight.  Pulmonary:     Effort: Pulmonary effort is normal.  Neurological:     Mental Status: She is alert.     Motor: No weakness.     Gait: Gait normal.  Psychiatric:        Mood and  Affect: Mood normal.        Behavior: Behavior normal.        Thought Content: Thought content normal.        Judgment: Judgment normal.    Review of Systems  Constitutional:  Negative for chills and fever.  Cardiovascular:  Negative for chest pain and palpitations.  Neurological:  Negative for dizziness, tingling, tremors and headaches.  Psychiatric/Behavioral:  Negative for depression, hallucinations, memory loss, substance abuse and suicidal ideas. The patient is not nervous/anxious and does not have insomnia.   All other systems reviewed and are negative.  Blood pressure 101/62, pulse 77, temperature 98.4 F (36.9 C), temperature source Oral, resp. rate 18, height 5\' 4"  (1.626 m), weight 95.3 kg, SpO2 100 %. Body mass index is 36.05 kg/m.   Social History   Tobacco Use  Smoking Status Never   Passive exposure:  Never  Smokeless Tobacco Never   Tobacco Cessation:  N/A, patient does not currently use tobacco products   Blood Alcohol level:  Lab Results  Component Value Date   ETH <10 03/08/2023    Metabolic Disorder Labs:  No results found for: "HGBA1C", "MPG" No results found for: "PROLACTIN" Lab Results  Component Value Date   CHOL 104 05/01/2013   TRIG 66 05/01/2013   HDL 42 05/01/2013   VLDL 13 05/01/2013   LDLCALC 49 05/01/2013    See Psychiatric Specialty Exam and Suicide Risk Assessment completed by Attending Physician prior to discharge.  Discharge destination:  Home  Is patient on multiple antipsychotic therapies at discharge:  No   Has Patient had three or more failed trials of antipsychotic monotherapy by history:  No  Recommended Plan for Multiple Antipsychotic Therapies: NA  Discharge Instructions     Diet - low sodium heart healthy   Complete by: As directed    Increase activity slowly   Complete by: As directed       Allergies as of 03/12/2023       Reactions   Sulfa Antibiotics Nausea And Vomiting        Medication List      STOP taking these medications    busPIRone 15 MG tablet Commonly known as: BUSPAR   nortriptyline 25 MG capsule Commonly known as: PAMELOR   promethazine-dextromethorphan 6.25-15 MG/5ML syrup Commonly known as: PROMETHAZINE-DM   pseudoephedrine 60 MG tablet Commonly known as: SUDAFED       TAKE these medications      Indication  ARIPiprazole 20 MG tablet Commonly known as: ABILIFY Take 1 tablet (20 mg total) by mouth daily. Start taking on: Mar 13, 2023 What changed:  medication strength how much to take Another medication with the same name was removed. Continue taking this medication, and follow the directions you see here.  Indication: MIXED BIPOLAR AFFECTIVE DISORDER   butalbital-acetaminophen-caffeine 50-325-40 MG tablet Commonly known as: FIORICET Take 1-2 tablets by mouth every 4 (four) hours as needed for headache or migraine.  Indication: migraine   cetirizine 10 MG tablet Commonly known as: ZyrTEC Allergy Take 1 tablet (10 mg total) by mouth daily.  Indication: Hayfever   cyclobenzaprine 10 MG tablet Commonly known as: FLEXERIL Take 10 mg by mouth at bedtime.  Indication: Fibromyalgia Syndrome, Muscle Spasm   diclofenac 75 MG EC tablet Commonly known as: VOLTAREN Take 75 mg by mouth 2 (two) times daily.  Indication: Joint Damage causing Pain and Loss of Function   ibuprofen 200 MG tablet Commonly known as: ADVIL Take 600-800 mg by mouth 2 (two) times daily as needed for headache or moderate pain.  Indication: Pain   lisinopril-hydrochlorothiazide 10-12.5 MG tablet Commonly known as: ZESTORETIC Take 1 tablet by mouth daily.  Indication: High Blood Pressure Disorder   prazosin 1 MG capsule Commonly known as: MINIPRESS Take 1 capsule (1 mg total) by mouth at bedtime.  Indication: High Blood Pressure Disorder, Frightening Dreams   rizatriptan 10 MG tablet Commonly known as: MAXALT Take 10 mg by mouth daily as needed for migraine.   Indication: Migraine Headache   topiramate 25 MG tablet Commonly known as: TOPAMAX Take 1 tablet (25 mg total) by mouth daily. Start taking on: Mar 13, 2023  Indication: Migraine Headache   venlafaxine XR 150 MG 24 hr capsule Commonly known as: EFFEXOR-XR Take 150 mg by mouth daily with breakfast.  Indication: Generalized Anxiety Disorder  Follow-up Information     [facility] Go on 03/21/2023.   Why: Please arrive for your scheduled appointment @ 8 am for counseling Contact information: [address] [place] [address] [phone]         [facility] on 04/14/2023.   Why: Medication Management @ 800am (Virtual) Contact information: [address], [place], [place] [address] Hours:  Open  Closes 5 Phone: [phone]                Follow-up recommendations:    Activity: as tolerated  Diet: heart healthy  Other: -Follow-up with your outpatient psychiatric provider -instructions on appointment date, time, and address (location) are provided to you in discharge paperwork.  -Take your psychiatric medications as prescribed at discharge - instructions are provided to you in the discharge paperwork  -Follow-up with outpatient primary care doctor and other specialists -for management of preventative medicine and chronic medical disease  -Recommend abstinence from alcohol, tobacco, and other illicit drug use at discharge.   -If your psychiatric symptoms recur, worsen, or if you have side effects to your psychiatric medications, call your outpatient psychiatric provider, 911, 988 or go to the nearest emergency department.  -If suicidal thoughts recur, call your outpatient psychiatric provider, 911, 988 or go to the nearest emergency department.   Signed: Cristy Hilts, MD 03/12/2023, 9:31 AM  Total Time Spent in Direct Patient Care:  I personally spent 35 minutes on the unit in direct patient care. The direct  patient care time included face-to-face time with the patient, reviewing the patient's chart, communicating with other professionals, and coordinating care. Greater than 50% of this time was spent in counseling or coordinating care with the patient regarding goals of hospitalization, psycho-education, and discharge planning needs.   Phineas Inches, MD Psychiatrist

## 2023-03-12 NOTE — BH IP Treatment Plan (Signed)
Interdisciplinary Treatment and Diagnostic Plan Update  03/12/2023 Time of Session: 9:25 AM  Amber Nolan MRN: [id number]  Principal Diagnosis: Bipolar disorder with severe depression (HCC)  Secondary Diagnoses: Principal Problem:   Bipolar disorder with severe depression (HCC) Active Problems:   Migraine with aura   Severe episode of recurrent major depressive disorder, without psychotic features (HCC)   GAD (generalized anxiety disorder)   PTSD (post-traumatic stress disorder)   Current Medications:  Current Facility-Administered Medications  Medication Dose Route Frequency Provider Last Rate Last Admin   acetaminophen (TYLENOL) tablet 650 mg  650 mg Oral Q6H PRN Bobbitt, Shalon E, NP   650 mg at 03/09/23 1806   alum & mag hydroxide-simeth (MAALOX/MYLANTA) 200-200-20 MG/5ML suspension 30 mL  30 mL Oral Q4H PRN Bobbitt, Shalon E, NP       ARIPiprazole (ABILIFY) tablet 20 mg  20 mg Oral Daily Massengill, Harrold Donath, MD   20 mg at 03/12/23 0749   butalbital-acetaminophen-caffeine (FIORICET) 50-325-40 MG per tablet 1-2 tablet  1-2 tablet Oral Q4H PRN Bobbitt, Shalon E, NP       diclofenac (VOLTAREN) EC tablet 75 mg  75 mg Oral BID Bobbitt, Shalon E, NP   75 mg at 03/12/23 0749   diphenhydrAMINE (BENADRYL) capsule 50 mg  50 mg Oral TID PRN Bobbitt, Shalon E, NP       Or   diphenhydrAMINE (BENADRYL) injection 50 mg  50 mg Intramuscular TID PRN Bobbitt, Shalon E, NP       haloperidol (HALDOL) tablet 5 mg  5 mg Oral TID PRN Bobbitt, Shalon E, NP       Or   haloperidol lactate (HALDOL) injection 5 mg  5 mg Intramuscular TID PRN Bobbitt, Shalon E, NP       hydrochlorothiazide (HYDRODIURIL) tablet 12.5 mg  12.5 mg Oral Daily Massengill, Harrold Donath, MD   12.5 mg at 03/12/23 0749   hydrOXYzine (ATARAX) tablet 25 mg  25 mg Oral TID PRN Bobbitt, Shalon E, NP   25 mg at 03/09/23 2116   lisinopril (ZESTRIL) tablet 10 mg  10 mg Oral Daily Massengill, Harrold Donath, MD   10 mg at 03/12/23 0749    loratadine (CLARITIN) tablet 10 mg  10 mg Oral Daily Bobbitt, Shalon E, NP   10 mg at 03/12/23 0749   LORazepam (ATIVAN) tablet 2 mg  2 mg Oral TID PRN Bobbitt, Shalon E, NP       Or   LORazepam (ATIVAN) injection 2 mg  2 mg Intramuscular TID PRN Bobbitt, Shalon E, NP       magnesium hydroxide (MILK OF MAGNESIA) suspension 30 mL  30 mL Oral Daily PRN Bobbitt, Shalon E, NP       prazosin (MINIPRESS) capsule 1 mg  1 mg Oral QHS Massengill, Nathan, MD   1 mg at 03/11/23 2132   topiramate (TOPAMAX) tablet 25 mg  25 mg Oral Daily Massengill, Harrold Donath, MD   25 mg at 03/12/23 0749   traZODone (DESYREL) tablet 50 mg  50 mg Oral QHS PRN Bobbitt, Shalon E, NP   50 mg at 03/11/23 2132   venlafaxine XR (EFFEXOR-XR) 24 hr capsule 150 mg  150 mg Oral Q breakfast Bobbitt, Shalon E, NP   150 mg at 03/12/23 0749   PTA Medications: Medications Prior to Admission  Medication Sig Dispense Refill Last Dose   ARIPiprazole (ABILIFY) 15 MG tablet Take 15 mg by mouth daily.      ARIPiprazole (ABILIFY) 5 MG tablet Take 5 mg by  mouth daily.      busPIRone (BUSPAR) 15 MG tablet Take 15 mg by mouth 2 (two) times daily as needed (anxiety).      butalbital-acetaminophen-caffeine (FIORICET) 50-325-40 MG tablet Take 1-2 tablets by mouth every 4 (four) hours as needed for headache or migraine.      cetirizine (ZYRTEC ALLERGY) 10 MG tablet Take 1 tablet (10 mg total) by mouth daily. 90 tablet 0    cyclobenzaprine (FLEXERIL) 10 MG tablet Take 10 mg by mouth at bedtime.      diclofenac (VOLTAREN) 75 MG EC tablet Take 75 mg by mouth 2 (two) times daily.      ibuprofen (ADVIL) 200 MG tablet Take 600-800 mg by mouth 2 (two) times daily as needed for headache or moderate pain.      lisinopril-hydrochlorothiazide (ZESTORETIC) 10-12.5 MG tablet Take 1 tablet by mouth daily.      nortriptyline (PAMELOR) 25 MG capsule Take 25 mg by mouth at bedtime.      promethazine-dextromethorphan (PROMETHAZINE-DM) 6.25-15 MG/5ML syrup Take 5 mLs by  mouth 3 (three) times daily as needed for cough. (Patient not taking: Reported on 03/09/2023) 200 mL 0    pseudoephedrine (SUDAFED) 60 MG tablet Take 1 tablet (60 mg total) by mouth every 8 (eight) hours as needed for congestion. (Patient not taking: Reported on 03/09/2023) 30 tablet 0    rizatriptan (MAXALT) 10 MG tablet Take 10 mg by mouth daily as needed for migraine.      venlafaxine XR (EFFEXOR-XR) 150 MG 24 hr capsule Take 150 mg by mouth daily with breakfast.       Patient Stressors: Financial difficulties   Marital or family conflict    Patient Strengths: Communication skills   Treatment Modalities: Medication Management, Group therapy, Case management,  1 to 1 session with clinician, Psychoeducation, Recreational therapy.   Physician Treatment Plan for Primary Diagnosis: Bipolar disorder with severe depression (HCC) Long Term Goal(s): Improvement in symptoms so as ready for discharge   Short Term Goals: Ability to identify changes in lifestyle to reduce recurrence of condition will improve Ability to verbalize feelings will improve Ability to disclose and discuss suicidal ideas Ability to demonstrate self-control will improve Ability to identify and develop effective coping behaviors will improve Ability to maintain clinical measurements within normal limits will improve  Medication Management: Evaluate patient's response, side effects, and tolerance of medication regimen.  Therapeutic Interventions: 1 to 1 sessions, Unit Group sessions and Medication administration.  Evaluation of Outcomes: Adequate for Discharge  Physician Treatment Plan for Secondary Diagnosis: Principal Problem:   Bipolar disorder with severe depression (HCC) Active Problems:   Migraine with aura   Severe episode of recurrent major depressive disorder, without psychotic features (HCC)   GAD (generalized anxiety disorder)   PTSD (post-traumatic stress disorder)  Long Term Goal(s): Improvement in  symptoms so as ready for discharge   Short Term Goals: Ability to identify changes in lifestyle to reduce recurrence of condition will improve Ability to verbalize feelings will improve Ability to disclose and discuss suicidal ideas Ability to demonstrate self-control will improve Ability to identify and develop effective coping behaviors will improve Ability to maintain clinical measurements within normal limits will improve     Medication Management: Evaluate patient's response, side effects, and tolerance of medication regimen.  Therapeutic Interventions: 1 to 1 sessions, Unit Group sessions and Medication administration.  Evaluation of Outcomes: Adequate for Discharge   RN Treatment Plan for Primary Diagnosis: Bipolar disorder with severe depression (HCC) Long Term  Goal(s): Knowledge of disease and therapeutic regimen to maintain health will improve  Short Term Goals: Ability to remain free from injury will improve, Ability to verbalize frustration and anger appropriately will improve, Ability to participate in decision making will improve, Ability to verbalize feelings will improve, Ability to identify and develop effective coping behaviors will improve, and Compliance with prescribed medications will improve  Medication Management: RN will administer medications as ordered by provider, will assess and evaluate patient's response and provide education to patient for prescribed medication. RN will report any adverse and/or side effects to prescribing provider.  Therapeutic Interventions: 1 on 1 counseling sessions, Psychoeducation, Medication administration, Evaluate responses to treatment, Monitor vital signs and CBGs as ordered, Perform/monitor CIWA, COWS, AIMS and Fall Risk screenings as ordered, Perform wound care treatments as ordered.  Evaluation of Outcomes: Adequate for Discharge   LCSW Treatment Plan for Primary Diagnosis: Bipolar disorder with severe depression (HCC) Long  Term Goal(s): Safe transition to appropriate next level of care at discharge, Engage patient in therapeutic group addressing interpersonal concerns.  Short Term Goals: Engage patient in aftercare planning with referrals and resources, Increase social support, Increase ability to appropriately verbalize feelings, Increase emotional regulation, Facilitate acceptance of mental health diagnosis and concerns, Facilitate patient progression through stages of change regarding substance use diagnoses and concerns, and Identify triggers associated with mental health/substance abuse issues  Therapeutic Interventions: Assess for all discharge needs, 1 to 1 time with Social worker, Explore available resources and support systems, Assess for adequacy in community support network, Educate family and significant other(s) on suicide prevention, Complete Psychosocial Assessment, Interpersonal group therapy.  Evaluation of Outcomes: Adequate for Discharge   Progress in Treatment: Attending groups: Yes. Participating in groups: Yes. Taking medication as prescribed: Yes. Toleration medication: Yes. Family/Significant other contact made: Yes, individual(s) contacted:  Husband  Patient understands diagnosis: Yes. Discussing patient identified problems/goals with staff: Yes. Medical problems stabilized or resolved: Yes. Denies suicidal/homicidal ideation: Yes. Issues/concerns per patient self-inventory: No.   New problem(s) identified: No, Describe:  None reported   New Short Term/Long Term Goal(s):medication stabilization, elimination of SI thoughts, development of comprehensive mental wellness plan.    Patient Goals:  " get stable on my medications "   Discharge Plan or Barriers: Patient recently admitted. CSW will continue to follow and assess for appropriate referrals and possible discharge planning.    Reason for Continuation of Hospitalization: Anxiety Depression Medication stabilization Suicidal  ideation  Estimated Length of Stay: PT IS DISCHARGING TODAY @ 10:30 AM   Last 3 [place] Suicide Severity Risk Score: Flowsheet Row Ad[facility] (Current) [facility] from 03/08/2023 in [facility] at [facility] ED from 11/12/2022 in [facility] [place] RISK CATEGORY No Risk High Risk Error: Question 1 not populated       Last [facility] 2/9 Scores:     No data to display          Scribe for Treatment Team: Beather Arbour 03/12/2023 9:27 AM

## 2023-03-12 NOTE — Discharge Instructions (Signed)
-  Follow-up with your outpatient psychiatric provider -instructions on appointment date, time, and address (location) are provided to you in discharge paperwork.  -Take your psychiatric medications as prescribed at discharge - instructions are provided to you in the discharge paperwork  -Follow-up with outpatient primary care doctor and other specialists -for management of preventative medicine and any chronic medical disease.  -Recommend abstinence from alcohol, tobacco, and other illicit drug use at discharge.   -If your psychiatric symptoms recur, worsen, or if you have side effects to your psychiatric medications, call your outpatient psychiatric provider, 911, [phone] or go to the nearest emergency department.  -If suicidal thoughts occur, call your outpatient psychiatric provider, 911, [phone] or go to the nearest emergency department.  Naloxone (Narcan) can help reverse an overdose when given to the victim quickly.  [place] offers free naloxone kits and instructions/training on its use.  Add naloxone to your first aid kit and you can help save a life.   Pick up your free kit at the following locations:   [place]:  [facility], [address] [place] [address] ([phone]) [facility] [address] [place] [id number] ([phone]) [facility] [address]  [place] [address]  [place]: [facility] [address] [place] [address] ([phone]) [facility] [address] [place]  [address] ([phone])  

## 2023-03-12 NOTE — Progress Notes (Signed)
  [facility] Adult Case Management Discharge Plan :  Will you be returning to the same living situation after discharge:  Yes,  pt will return to her residence At discharge, do you have transportation home?: Yes,  -Waylynn Benefiel (Husband) [phone] Do you have the ability to pay for your medications: Yes,  Insured  Release of information consent forms completed and in the chart;  Patient's signature needed at discharge.  Patient to Follow up at:  Follow-up Information     [facility] Go on 03/21/2023.   Why: Please arrive for your scheduled appointment @ 8 am for counseling Contact information: [address] [place] [address] [phone]         [facility] Go on 04/14/2023.   Why: Medication Management @ 800am (Virtual) Contact information: [address], [place], [place] [address] Hours:  Open  Closes 5 Phone: [phone]                Next level of care provider has access to [facility] Link:yes  Safety Planning and Suicide Prevention discussed: Yes,  -Olanna Percifield (Husband) [phone]     Has patient been referred to the Quitline?: N/A patient is not a smoker  Patient has been referred for addiction treatment: N/A Patient to continue working towards treatment goals after discharge. Patient no longer meets criteria for inpatient criteria per attending physician. Continue taking medications as prescribed, nursing to provide instructions at discharge. Follow up with all scheduled appointments.   Antonietta Lansdowne S Kaheem Halleck, LCSW 03/12/2023, 9:29 AM

## 2023-03-12 NOTE — Progress Notes (Signed)
Pt discharged at this time. Pt left facility with family member. Pt removed all belongings and verbalized understanding of medications and follow up care. Pt denies SI/HI/AVH.

## 2023-04-06 ENCOUNTER — Other Ambulatory Visit: Payer: Self-pay

## 2023-04-06 ENCOUNTER — Encounter (HOSPITAL_COMMUNITY): Payer: Self-pay | Admitting: Emergency Medicine

## 2023-04-06 ENCOUNTER — Emergency Department (HOSPITAL_COMMUNITY): Payer: PRIVATE HEALTH INSURANCE

## 2023-04-06 ENCOUNTER — Emergency Department (HOSPITAL_COMMUNITY)
Admission: EM | Admit: 2023-04-06 | Discharge: 2023-04-06 | Disposition: A | Discharge status: Home / Self Care | Payer: PRIVATE HEALTH INSURANCE | Attending: Emergency Medicine | Admitting: Emergency Medicine

## 2023-04-06 DIAGNOSIS — R300 Dysuria: Secondary | ICD-10-CM | POA: Diagnosis not present

## 2023-04-06 DIAGNOSIS — R3915 Urgency of urination: Secondary | ICD-10-CM | POA: Diagnosis not present

## 2023-04-06 LAB — HEPATIC FUNCTION PANEL
ALT: 14 U/L (ref 0–44)
AST: 13 U/L — ABNORMAL LOW (ref 15–41)
Albumin: 3.4 g/dL — ABNORMAL LOW (ref 3.5–5.0)
Alkaline Phosphatase: 50 U/L (ref 38–126)
Bilirubin, Direct: <0.1 mg/dL (ref 0.0–0.2)
Total Bilirubin: 0.5 mg/dL (ref 0.3–1.2)
Total Protein: 6.7 g/dL (ref 6.5–8.1)

## 2023-04-06 LAB — BASIC METABOLIC PANEL
Anion gap: 14 (ref 5–15)
BUN: 9 mg/dL (ref 6–20)
CO2: 19 mmol/L — ABNORMAL LOW (ref 22–32)
Calcium: 8.3 mg/dL — ABNORMAL LOW (ref 8.9–10.3)
Chloride: 103 mmol/L (ref 98–111)
Creatinine, Ser: 0.67 mg/dL (ref 0.44–1.00)
GFR, Estimated: >60 mL/min (ref >60)
Glucose, Bld: 100 mg/dL — ABNORMAL HIGH (ref 70–99)
Potassium: 3.2 mmol/L — ABNORMAL LOW (ref 3.5–5.1)
Sodium: 136 mmol/L (ref 135–145)

## 2023-04-06 LAB — URINALYSIS, ROUTINE W REFLEX MICROSCOPIC
Bacteria, UA: NONE SEEN
Bilirubin Urine: NEGATIVE
Glucose, UA: NEGATIVE mg/dL
Ketones, ur: NEGATIVE mg/dL
Leukocytes,Ua: NEGATIVE
Nitrite: NEGATIVE
Protein, ur: NEGATIVE mg/dL
Specific Gravity, Urine: 1.011 (ref 1.005–1.030)
pH: 7 (ref 5.0–8.0)

## 2023-04-06 LAB — PREGNANCY, URINE: Preg Test, Ur: NEGATIVE

## 2023-04-06 LAB — CBC
HCT: 34.5 % — ABNORMAL LOW (ref 36.0–46.0)
Hemoglobin: 11.7 g/dL — ABNORMAL LOW (ref 12.0–15.0)
MCH: 31.8 pg (ref 26.0–34.0)
MCHC: 33.9 g/dL (ref 30.0–36.0)
MCV: 93.8 fL (ref 80.0–100.0)
Platelets: 354 10*3/uL (ref 150–400)
RBC: 3.68 MIL/uL — ABNORMAL LOW (ref 3.87–5.11)
RDW: 12.7 % (ref 11.5–15.5)
WBC: 9.5 10*3/uL (ref 4.0–10.5)
nRBC: 0 % (ref 0.0–0.2)

## 2023-04-06 LAB — LIPASE, BLOOD: Lipase: 39 U/L (ref 11–51)

## 2023-04-06 MED ORDER — CEPHALEXIN 500 MG PO CAPS
500.0000 mg | ORAL_CAPSULE | Freq: Once | ORAL | Status: AC
  Administered 2023-04-06: 500 mg via ORAL
  Filled 2023-04-06: qty 1

## 2023-04-06 MED ORDER — HYDROMORPHONE HCL 1 MG/ML IJ SOLN
1.0000 mg | Freq: Once | INTRAMUSCULAR | Status: AC
  Administered 2023-04-06: 1 mg via INTRAVENOUS
  Filled 2023-04-06: qty 1

## 2023-04-06 MED ORDER — CEPHALEXIN 500 MG PO CAPS: 500.0000 mg | ORAL_CAPSULE | Freq: Four times a day (QID) | ORAL | 0 refills | Status: AC

## 2023-04-06 MED ORDER — ONDANSETRON HCL 4 MG/2ML IJ SOLN
4.0000 mg | Freq: Once | INTRAMUSCULAR | Status: AC
  Administered 2023-04-06: 4 mg via INTRAVENOUS
  Filled 2023-04-06: qty 2

## 2023-04-06 NOTE — Discharge Instructions (Addendum)
See your Physician for recheck.  °

## 2023-04-06 NOTE — ED Provider Notes (Signed)
[place] EMERGENCY DEPARTMENT AT [facility] Provider Note   CSN: [id number] Arrival date & time: 04/06/23  1725     History  Chief Complaint  Patient presents with   Urinary Frequency    Amber Nolan is a 42 y.o. female.  Patient complains of flank pain and foul-smelling urine.  Patient is concerned that she has a urinary tract infection.  Patient denies any fever or chills she has not had any vomiting or diarrhea.  Patient has a past medical history of kidney stones.  Patient is followed by urology.  The history is provided by the patient and the spouse. No language interpreter was used.  Urinary Frequency This is a recurrent problem. The current episode started more than 1 week ago. The problem occurs constantly. The problem has been gradually worsening. Pertinent negatives include no abdominal pain. Nothing aggravates the symptoms. Nothing relieves the symptoms. She has tried nothing for the symptoms.       Home Medications Prior to Admission medications   Medication Sig Start Date End Date Taking? Authorizing Provider  cephALEXin (KEFLEX) 500 MG capsule Take 1 capsule (500 mg total) by mouth 4 (four) times daily for 7 days. 04/06/23 04/13/23 Yes Cheron Schaumann K, PA-C  cephALEXin (KEFLEX) 500 MG capsule Take 1 capsule (500 mg total) by mouth 4 (four) times daily for 7 days. 04/06/23 04/13/23 Yes Cheron Schaumann K, PA-C  ARIPiprazole (ABILIFY) 20 MG tablet Take 1 tablet (20 mg total) by mouth daily. 03/13/23 04/12/23  Massengill, Harrold Donath, MD  butalbital-acetaminophen-caffeine (FIORICET) 50-325-40 MG tablet Take 1-2 tablets by mouth every 4 (four) hours as needed for headache or migraine. 12/29/19   [provider]  cetirizine (ZYRTEC ALLERGY) 10 MG tablet Take 1 tablet (10 mg total) by mouth daily. 11/12/22   Wallis Bamberg, PA-C  cyclobenzaprine (FLEXERIL) 10 MG tablet Take 10 mg by mouth at bedtime. 01/17/20   [provider]  diclofenac (VOLTAREN) 75 MG  EC tablet Take 75 mg by mouth 2 (two) times daily.    [provider]  ibuprofen (ADVIL) 200 MG tablet Take 600-800 mg by mouth 2 (two) times daily as needed for headache or moderate pain.    [provider]  lisinopril-hydrochlorothiazide (ZESTORETIC) 10-12.5 MG tablet Take 1 tablet by mouth daily.    [provider]  prazosin (MINIPRESS) 1 MG capsule Take 1 capsule (1 mg total) by mouth at bedtime. 03/12/23 04/11/23  Massengill, Harrold Donath, MD  rizatriptan (MAXALT) 10 MG tablet Take 10 mg by mouth daily as needed for migraine.    [provider]  topiramate (TOPAMAX) 25 MG tablet Take 1 tablet (25 mg total) by mouth daily. 03/13/23 04/12/23  Massengill, Harrold Donath, MD  venlafaxine XR (EFFEXOR-XR) 150 MG 24 hr capsule Take 150 mg by mouth daily with breakfast.    [provider]      Allergies    Sulfa antibiotics    Review of Systems   Review of Systems  Gastrointestinal:  Negative for abdominal pain.  Genitourinary:  Positive for frequency.  All other systems reviewed and are negative.   Physical Exam Updated Vital Signs BP 122/71 (BP Location: Right Arm)   Pulse 83   Temp 98.6 F (37 C) (Oral)   Resp 18   Ht 5\' 4"  (1.626 m)   Wt 95.3 kg   LMP 02/14/2023 (Approximate)   SpO2 100%   BMI 36.05 kg/m  Physical Exam Vitals and nursing note reviewed.  Constitutional:  Appearance: She is well-developed.  HENT:     Head: Normocephalic.  Cardiovascular:     Rate and Rhythm: Normal rate.  Pulmonary:     Effort: Pulmonary effort is normal.  Abdominal:     General: There is no distension.  Musculoskeletal:        General: Normal range of motion.     Cervical back: Normal range of motion.  Neurological:     General: No focal deficit present.     Mental Status: She is alert and oriented to person, place, and time.  Psychiatric:        Mood and Affect: Mood normal.     ED Results / Procedures / Treatments   Labs (all labs ordered are  listed, but only abnormal results are displayed) Labs Reviewed  URINALYSIS, ROUTINE W REFLEX MICROSCOPIC - Abnormal; Notable for the following components:      Result Value   Color, Urine STRAW (*)    Hgb urine dipstick SMALL (*)    All other components within normal limits  BASIC METABOLIC PANEL - Abnormal; Notable for the following components:   Potassium 3.2 (*)    CO2 19 (*)    Glucose, Bld 100 (*)    Calcium 8.3 (*)    All other components within normal limits  CBC - Abnormal; Notable for the following components:   RBC 3.68 (*)    Hemoglobin 11.7 (*)    HCT 34.5 (*)    All other components within normal limits  HEPATIC FUNCTION PANEL - Abnormal; Notable for the following components:   Albumin 3.4 (*)    AST 13 (*)    All other components within normal limits  PREGNANCY, URINE  LIPASE, BLOOD    EKG None  Radiology CT Renal Stone Study  Result Date: 04/06/2023 CLINICAL DATA:  Burning urination with urinary urgency, frequency and nausea. EXAM: CT ABDOMEN AND PELVIS WITHOUT CONTRAST TECHNIQUE: Multidetector CT imaging of the abdomen and pelvis was performed following the standard protocol without IV contrast. RADIATION DOSE REDUCTION: This exam was performed according to the departmental dose-optimization program which includes automated exposure control, adjustment of the mA and/or kV according to patient size and/or use of iterative reconstruction technique. COMPARISON:  June 09, 2021 FINDINGS: Lower chest: No acute abnormality. Hepatobiliary: No focal liver abnormality is seen. Status post cholecystectomy. No biliary dilatation. Pancreas: Unremarkable. No pancreatic ductal dilatation or surrounding inflammatory changes. Spleen: Normal in size without focal abnormality. Adrenals/Urinary Tract: Adrenal glands are unremarkable. Kidneys are normal, without renal calculi, focal lesion, or hydronephrosis. The urinary bladder is poorly distended and subsequently limited in evaluation.  Stomach/Bowel: Stomach is within normal limits. The appendix is surgically absent no evidence of bowel wall thickening, distention, or inflammatory changes. Vascular/Lymphatic: No significant vascular findings are present. No enlarged abdominal or pelvic lymph nodes. Reproductive: The uterus and left adnexa are unremarkable. An ill-defined, proximally 2.9 cm x 1.6 cm cystic appearing structure is seen within the right adnexa. Other: No abdominal wall hernia or abnormality. No abdominopelvic ascites. Musculoskeletal: No acute or significant osseous findings. IMPRESSION: 1. Findings likely consistent with a small right ovarian cyst. Correlation with nonemergent pelvic ultrasound is recommended. This recommendation follows ACR consensus guidelines: White Paper of the ACR Incidental Findings Committee II on Adnexal Findings. J Am Coll Radiol 2013:10:675-681. 2. Evidence of prior cholecystectomy and appendectomy. Electronically Signed   By: Aram Candela M.D.   On: 04/06/2023 19:23    Procedures Procedures    Medications Ordered in  ED Medications  HYDROmorphone (DILAUDID) injection 1 mg (1 mg Intravenous Given 04/06/23 1852)  ondansetron (ZOFRAN) injection 4 mg (4 mg Intravenous Given 04/06/23 1852)  cephALEXin (KEFLEX) capsule 500 mg (500 mg Oral Given 04/06/23 2017)    ED Course/ Medical Decision Making/ A&P                             Medical Decision Making Patient complains of foul-smelling urine and discomfort with urination  Amount and/or Complexity of Data Reviewed Independent Historian: spouse    Details: Patient is here with her husband who is supportive External Data Reviewed: notes.    Details: Urology notes from [facility] reviewed Labs: ordered. Decision-making details documented in ED Course.    Details: Labs ordered reviewed and interpreted urinalysis is negative for nitrates leukocytes Radiology: ordered and independent interpretation performed. Decision-making details documented in  ED Course.    Details: CT renal ordered reviewed and interpreted no kidney stones no hydronephrosis.  Risk Prescription drug management. Risk Details: I discussed symptoms with the patient.  She wants antibiotic treatment as she states that she has had frequent urinalysis in the past that were negative and cultures were positive.  Patient placed on Keflex.  Patient advised to follow-up with her primary care physician for recheck.           Final Clinical Impression(s) / ED Diagnoses Final diagnoses:  Dysuria    Rx / DC Orders ED Discharge Orders          Ordered    cephALEXin (KEFLEX) 500 MG capsule  4 times daily        04/06/23 2001    cephALEXin (KEFLEX) 500 MG capsule  4 times daily        04/06/23 2004           An After Visit Summary was printed and given to the patient.    Osie Cheeks 04/06/23 2112    Derwood Kaplan, MD 04/11/23 [phone]

## 2023-04-06 NOTE — ED Triage Notes (Signed)
Pt reports strong-smelling urine, burning urination, urinary urgency, frequency, nausea, diarrhea, and chills since 10-14 days ago. Prior hx UTI with similar symptoms. Pt reports bladder pain and bilateral kidney pain rated 8 out of 10.

## 2023-04-12 DIAGNOSIS — Z419 Encounter for procedure for purposes other than remedying health state, unspecified: Secondary | ICD-10-CM | POA: Diagnosis not present

## 2023-04-14 DIAGNOSIS — F3181 Bipolar II disorder: Secondary | ICD-10-CM | POA: Diagnosis not present

## 2023-04-14 DIAGNOSIS — F4312 Post-traumatic stress disorder, chronic: Secondary | ICD-10-CM | POA: Diagnosis not present

## 2023-04-14 DIAGNOSIS — F6381 Intermittent explosive disorder: Secondary | ICD-10-CM | POA: Diagnosis not present

## 2023-04-14 IMAGING — CT CT RENAL STONE PROTOCOL
2 of 4 series · 17 of 46 positions shown, 19 images · non-contrast
Comparison: CT abdomen pelvis 02/16/2020

CLINICAL DATA: chronic UTI's, pt states she began having bilateral
flank pain for the past week, nausea, diarrhea.

EXAM:
CT ABDOMEN AND PELVIS WITHOUT CONTRAST
TECHNIQUE: Multidetector CT imaging of the abdomen and pelvis was performed
following the standard protocol without IV contrast.

[Series 2: axial st · axial · 0.89mm/px · z∈[+775,+1175]mm · 14 of 92 slices shown, 16 images]
[im 6/92  soft-tissue]
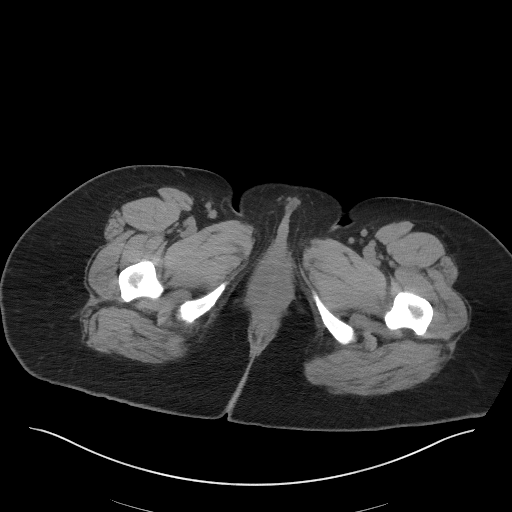
[im 6/92  bone]
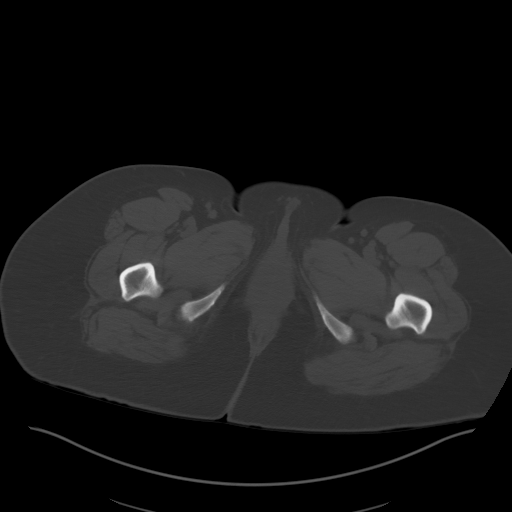
[im 12/92  soft-tissue]
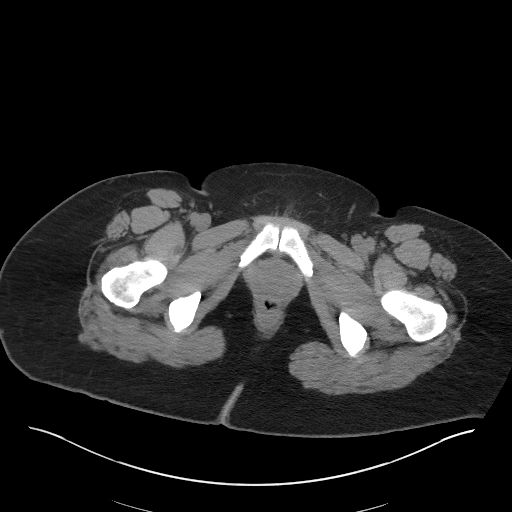
[im 18/92  soft-tissue]
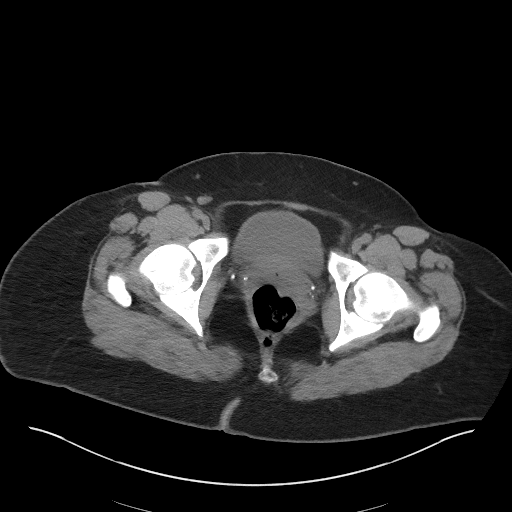
[im 23/92  soft-tissue]
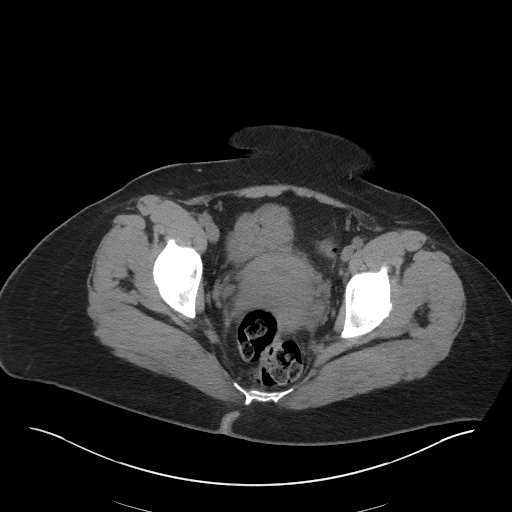
[im 29/92  soft-tissue]
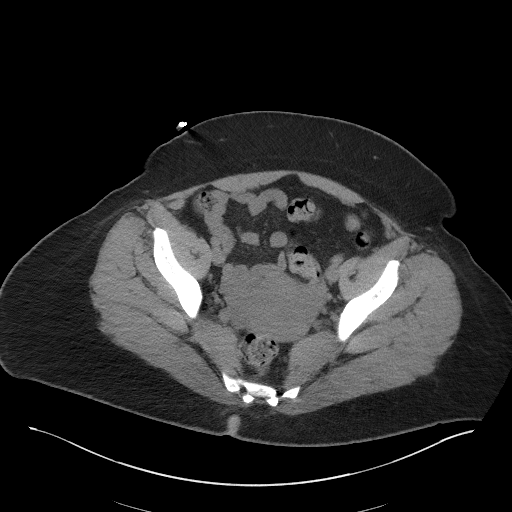
[im 35/92  soft-tissue]
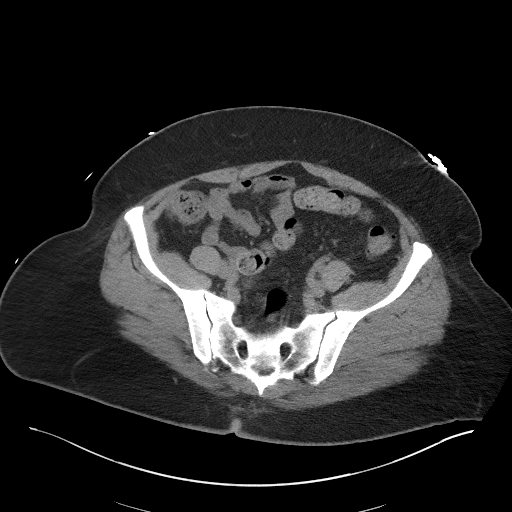
[im 40/92  soft-tissue]
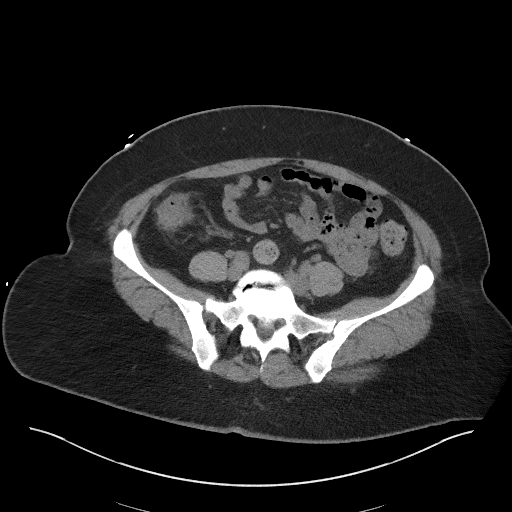
[im 52/92  soft-tissue]
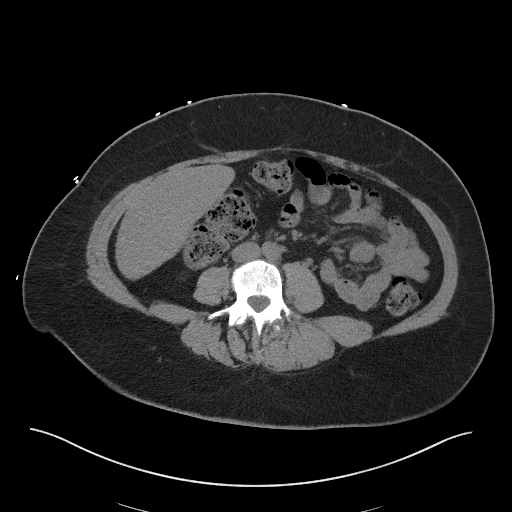
[im 57/92  soft-tissue]
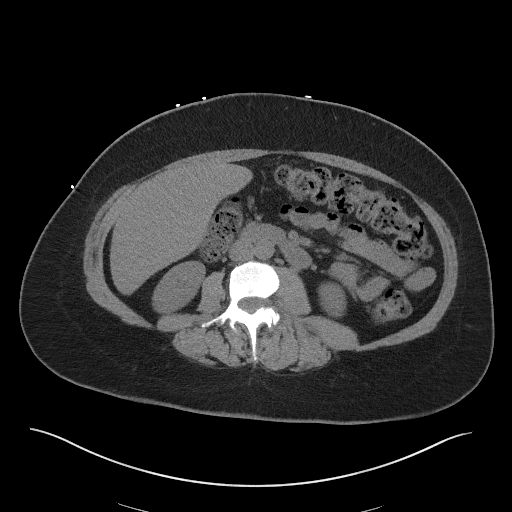
[im 57/92  bone]
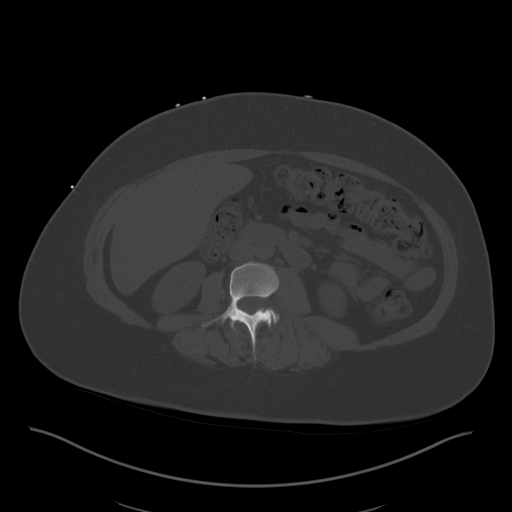
[im 63/92  soft-tissue]
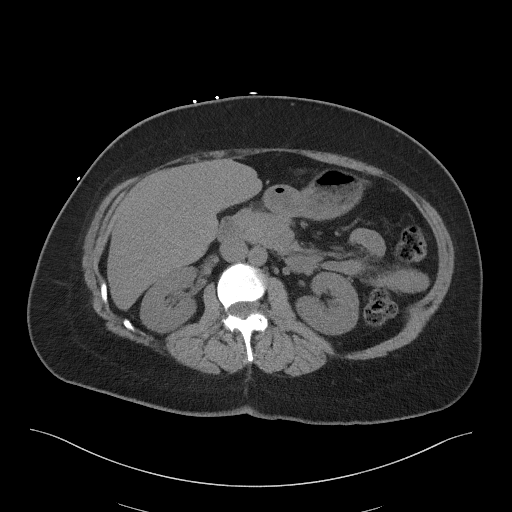
[im 69/92  soft-tissue]
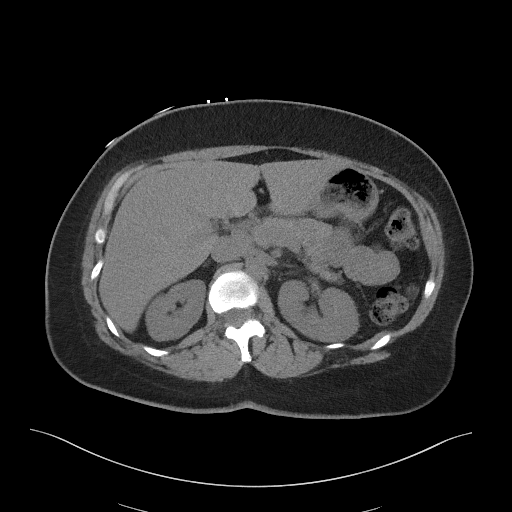
[im 74/92  soft-tissue]
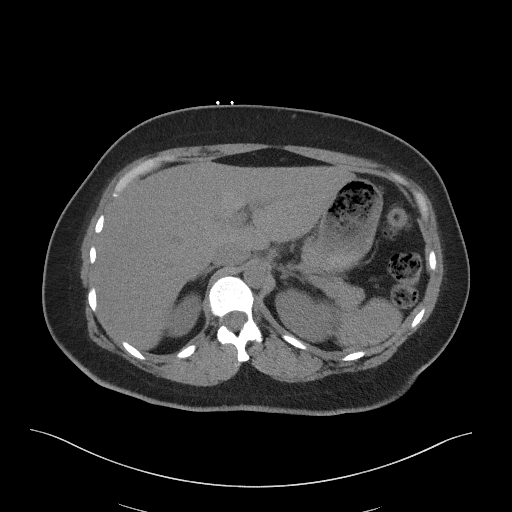
[im 80/92  soft-tissue]
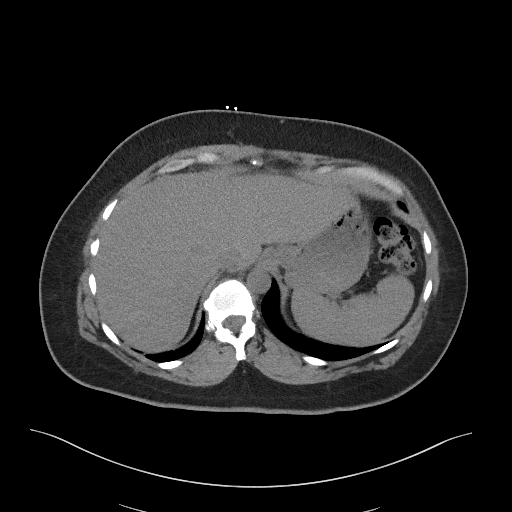
[im 86/92  soft-tissue]
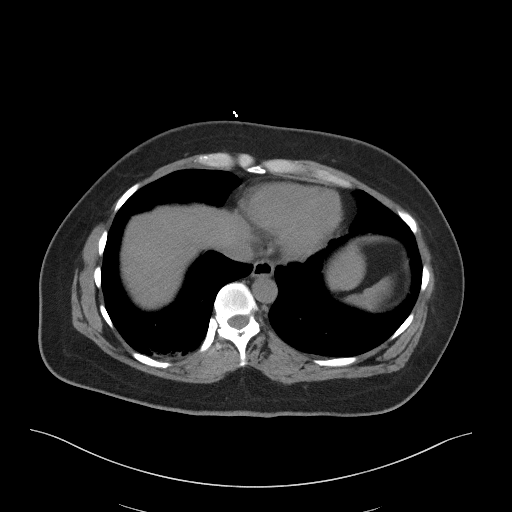

[Series 5: coronal st · coronal · 0.87mm/px · 3 of 108 slices shown]
[im 36/108  soft-tissue]
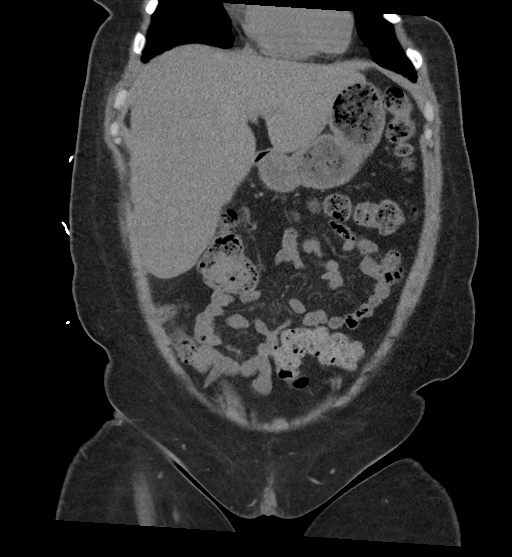
[im 48/108  soft-tissue]
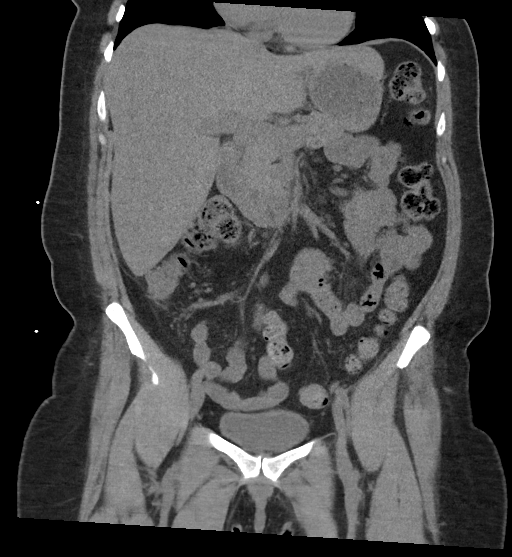
[im 60/108  soft-tissue]
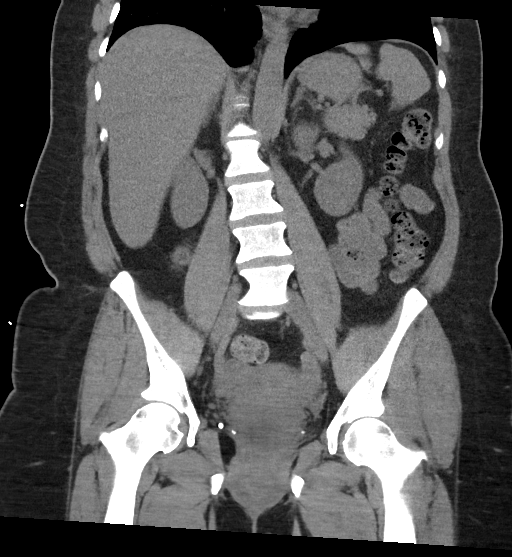

[17 of 46 positions shown; findings below may reference images not displayed]

FINDINGS: Lower chest: Mild right basilar opacities, favor atelectasis. Left
lung bases clear.

Evaluation of the abdominal viscera limited by the lack of IV
contrast.

Hepatobiliary: Diffuse hepatic steatosis. No focal lesion
identified. Gallbladder surgically absent.

Pancreas: Unremarkable. No surrounding inflammatory changes.

Spleen: Normal in size without focal abnormality.

Adrenals/Urinary Tract: Adrenal glands are unremarkable. There is
again a nonobstructing calculus in the left kidney measuring 5 mm.
No right renal calculi. No hydronephrosis. No mass lesion. Urinary
bladder is unremarkable.

Stomach/Bowel: Stomach is within normal limits. Appendix not well
visualized. No evidence of bowel wall thickening, distention, or
inflammatory changes.

Vascular/Lymphatic: Vascular patency cannot be assessed in the
absence IV contrast. No enlarged pelvic or abdominal lymph nodes.

Reproductive: Uterus and bilateral adnexa are unremarkable.

Other: No abdominal wall hernia or abnormality. No abdominopelvic
ascites.

Musculoskeletal: Lower lumbar spine degenerative disc disease.
IMPRESSION: 1. No acute finding in the abdomen or pelvis on a noncontrast exam.
2. Nonobstructing 5 mm calculus in the left kidney.
3. Diffuse hepatic steatosis.
4. Mild right basilar pulmonary opacities, favor atelectasis.

## 2023-04-15 DIAGNOSIS — F3181 Bipolar II disorder: Secondary | ICD-10-CM | POA: Diagnosis not present

## 2023-04-15 DIAGNOSIS — F4312 Post-traumatic stress disorder, chronic: Secondary | ICD-10-CM | POA: Diagnosis not present

## 2023-04-22 DIAGNOSIS — F4312 Post-traumatic stress disorder, chronic: Secondary | ICD-10-CM | POA: Diagnosis not present

## 2023-04-22 DIAGNOSIS — F6381 Intermittent explosive disorder: Secondary | ICD-10-CM | POA: Diagnosis not present

## 2023-04-22 DIAGNOSIS — F3181 Bipolar II disorder: Secondary | ICD-10-CM | POA: Diagnosis not present

## 2023-04-26 DIAGNOSIS — F4312 Post-traumatic stress disorder, chronic: Secondary | ICD-10-CM | POA: Diagnosis not present

## 2023-04-26 DIAGNOSIS — F6381 Intermittent explosive disorder: Secondary | ICD-10-CM | POA: Diagnosis not present

## 2023-04-26 DIAGNOSIS — F3181 Bipolar II disorder: Secondary | ICD-10-CM | POA: Diagnosis not present

## 2023-05-06 DIAGNOSIS — F6381 Intermittent explosive disorder: Secondary | ICD-10-CM | POA: Diagnosis not present

## 2023-05-06 DIAGNOSIS — F4312 Post-traumatic stress disorder, chronic: Secondary | ICD-10-CM | POA: Diagnosis not present

## 2023-05-06 DIAGNOSIS — F3181 Bipolar II disorder: Secondary | ICD-10-CM | POA: Diagnosis not present

## 2023-05-10 DIAGNOSIS — F3181 Bipolar II disorder: Secondary | ICD-10-CM | POA: Diagnosis not present

## 2023-05-10 DIAGNOSIS — F4312 Post-traumatic stress disorder, chronic: Secondary | ICD-10-CM | POA: Diagnosis not present

## 2023-05-10 DIAGNOSIS — F6381 Intermittent explosive disorder: Secondary | ICD-10-CM | POA: Diagnosis not present

## 2023-05-12 DIAGNOSIS — Z419 Encounter for procedure for purposes other than remedying health state, unspecified: Secondary | ICD-10-CM | POA: Diagnosis not present

## 2023-06-02 DIAGNOSIS — F4312 Post-traumatic stress disorder, chronic: Secondary | ICD-10-CM | POA: Diagnosis not present

## 2023-06-02 DIAGNOSIS — F6381 Intermittent explosive disorder: Secondary | ICD-10-CM | POA: Diagnosis not present

## 2023-06-02 DIAGNOSIS — F3181 Bipolar II disorder: Secondary | ICD-10-CM | POA: Diagnosis not present

## 2023-06-12 DIAGNOSIS — Z419 Encounter for procedure for purposes other than remedying health state, unspecified: Secondary | ICD-10-CM | POA: Diagnosis not present

## 2023-07-02 DIAGNOSIS — F4312 Post-traumatic stress disorder, chronic: Secondary | ICD-10-CM | POA: Diagnosis not present

## 2023-07-02 DIAGNOSIS — F6381 Intermittent explosive disorder: Secondary | ICD-10-CM | POA: Diagnosis not present

## 2023-07-02 DIAGNOSIS — F3181 Bipolar II disorder: Secondary | ICD-10-CM | POA: Diagnosis not present

## 2023-07-13 DIAGNOSIS — Z419 Encounter for procedure for purposes other than remedying health state, unspecified: Secondary | ICD-10-CM | POA: Diagnosis not present

## 2023-07-31 DIAGNOSIS — F4312 Post-traumatic stress disorder, chronic: Secondary | ICD-10-CM | POA: Diagnosis not present

## 2023-07-31 DIAGNOSIS — F6381 Intermittent explosive disorder: Secondary | ICD-10-CM | POA: Diagnosis not present

## 2023-07-31 DIAGNOSIS — F3181 Bipolar II disorder: Secondary | ICD-10-CM | POA: Diagnosis not present

## 2023-08-12 DIAGNOSIS — Z419 Encounter for procedure for purposes other than remedying health state, unspecified: Secondary | ICD-10-CM | POA: Diagnosis not present

## 2023-08-20 DIAGNOSIS — Z1152 Encounter for screening for COVID-19: Secondary | ICD-10-CM | POA: Diagnosis not present

## 2023-08-20 DIAGNOSIS — E669 Obesity, unspecified: Secondary | ICD-10-CM | POA: Diagnosis not present

## 2023-08-20 DIAGNOSIS — G4489 Other headache syndrome: Secondary | ICD-10-CM | POA: Diagnosis not present

## 2023-08-20 DIAGNOSIS — Z6836 Body mass index (BMI) 36.0-36.9, adult: Secondary | ICD-10-CM | POA: Diagnosis not present

## 2023-08-20 DIAGNOSIS — Z20822 Contact with and (suspected) exposure to covid-19: Secondary | ICD-10-CM | POA: Diagnosis not present

## 2023-08-27 DIAGNOSIS — F4312 Post-traumatic stress disorder, chronic: Secondary | ICD-10-CM | POA: Diagnosis not present

## 2023-08-27 DIAGNOSIS — F3181 Bipolar II disorder: Secondary | ICD-10-CM | POA: Diagnosis not present

## 2023-08-27 DIAGNOSIS — F6381 Intermittent explosive disorder: Secondary | ICD-10-CM | POA: Diagnosis not present

## 2023-09-12 DIAGNOSIS — Z419 Encounter for procedure for purposes other than remedying health state, unspecified: Secondary | ICD-10-CM | POA: Diagnosis not present

## 2023-09-26 DIAGNOSIS — F4312 Post-traumatic stress disorder, chronic: Secondary | ICD-10-CM | POA: Diagnosis not present

## 2023-09-26 DIAGNOSIS — F6381 Intermittent explosive disorder: Secondary | ICD-10-CM | POA: Diagnosis not present

## 2023-09-26 DIAGNOSIS — F3181 Bipolar II disorder: Secondary | ICD-10-CM | POA: Diagnosis not present

## 2023-10-12 DIAGNOSIS — Z419 Encounter for procedure for purposes other than remedying health state, unspecified: Secondary | ICD-10-CM | POA: Diagnosis not present

## 2023-10-24 DIAGNOSIS — F3181 Bipolar II disorder: Secondary | ICD-10-CM | POA: Diagnosis not present

## 2023-10-24 DIAGNOSIS — F6381 Intermittent explosive disorder: Secondary | ICD-10-CM | POA: Diagnosis not present

## 2023-10-24 DIAGNOSIS — F4312 Post-traumatic stress disorder, chronic: Secondary | ICD-10-CM | POA: Diagnosis not present

## 2023-11-12 DIAGNOSIS — Z419 Encounter for procedure for purposes other than remedying health state, unspecified: Secondary | ICD-10-CM | POA: Diagnosis not present

## 2023-11-28 DIAGNOSIS — F6381 Intermittent explosive disorder: Secondary | ICD-10-CM | POA: Diagnosis not present

## 2023-11-28 DIAGNOSIS — F4312 Post-traumatic stress disorder, chronic: Secondary | ICD-10-CM | POA: Diagnosis not present

## 2023-11-28 DIAGNOSIS — F3181 Bipolar II disorder: Secondary | ICD-10-CM | POA: Diagnosis not present

## 2023-12-13 DIAGNOSIS — Z419 Encounter for procedure for purposes other than remedying health state, unspecified: Secondary | ICD-10-CM | POA: Diagnosis not present

## 2023-12-17 DIAGNOSIS — F6381 Intermittent explosive disorder: Secondary | ICD-10-CM | POA: Diagnosis not present

## 2023-12-17 DIAGNOSIS — F3181 Bipolar II disorder: Secondary | ICD-10-CM | POA: Diagnosis not present

## 2023-12-17 DIAGNOSIS — F4312 Post-traumatic stress disorder, chronic: Secondary | ICD-10-CM | POA: Diagnosis not present

## 2024-01-07 DIAGNOSIS — K529 Noninfective gastroenteritis and colitis, unspecified: Secondary | ICD-10-CM | POA: Diagnosis not present

## 2024-01-08 DIAGNOSIS — R1030 Lower abdominal pain, unspecified: Secondary | ICD-10-CM | POA: Diagnosis not present

## 2024-01-08 DIAGNOSIS — Z1389 Encounter for screening for other disorder: Secondary | ICD-10-CM | POA: Diagnosis not present

## 2024-01-08 DIAGNOSIS — Z0130 Encounter for examination of blood pressure without abnormal findings: Secondary | ICD-10-CM | POA: Diagnosis not present

## 2024-01-08 DIAGNOSIS — R197 Diarrhea, unspecified: Secondary | ICD-10-CM | POA: Diagnosis not present

## 2024-01-10 DIAGNOSIS — Z419 Encounter for procedure for purposes other than remedying health state, unspecified: Secondary | ICD-10-CM | POA: Diagnosis not present

## 2024-01-12 DIAGNOSIS — F6381 Intermittent explosive disorder: Secondary | ICD-10-CM | POA: Diagnosis not present

## 2024-01-12 DIAGNOSIS — F3181 Bipolar II disorder: Secondary | ICD-10-CM | POA: Diagnosis not present

## 2024-01-12 DIAGNOSIS — F4312 Post-traumatic stress disorder, chronic: Secondary | ICD-10-CM | POA: Diagnosis not present

## 2024-01-19 DIAGNOSIS — R197 Diarrhea, unspecified: Secondary | ICD-10-CM | POA: Diagnosis not present

## 2024-01-19 DIAGNOSIS — R1030 Lower abdominal pain, unspecified: Secondary | ICD-10-CM | POA: Diagnosis not present

## 2024-02-21 DIAGNOSIS — Z419 Encounter for procedure for purposes other than remedying health state, unspecified: Secondary | ICD-10-CM | POA: Diagnosis not present

## 2024-03-19 DIAGNOSIS — F4312 Post-traumatic stress disorder, chronic: Secondary | ICD-10-CM | POA: Diagnosis not present

## 2024-03-19 DIAGNOSIS — F6381 Intermittent explosive disorder: Secondary | ICD-10-CM | POA: Diagnosis not present

## 2024-03-19 DIAGNOSIS — F3181 Bipolar II disorder: Secondary | ICD-10-CM | POA: Diagnosis not present

## 2024-03-22 DIAGNOSIS — Z419 Encounter for procedure for purposes other than remedying health state, unspecified: Secondary | ICD-10-CM | POA: Diagnosis not present

## 2024-04-22 DIAGNOSIS — Z419 Encounter for procedure for purposes other than remedying health state, unspecified: Secondary | ICD-10-CM | POA: Diagnosis not present

## 2024-05-22 DIAGNOSIS — Z419 Encounter for procedure for purposes other than remedying health state, unspecified: Secondary | ICD-10-CM | POA: Diagnosis not present

## 2024-06-22 DIAGNOSIS — Z419 Encounter for procedure for purposes other than remedying health state, unspecified: Secondary | ICD-10-CM | POA: Diagnosis not present

## 2024-07-23 DIAGNOSIS — Z419 Encounter for procedure for purposes other than remedying health state, unspecified: Secondary | ICD-10-CM | POA: Diagnosis not present

## 2024-08-09 ENCOUNTER — Ambulatory Visit (INDEPENDENT_AMBULATORY_CARE_PROVIDER_SITE_OTHER)

## 2024-08-09 ENCOUNTER — Ambulatory Visit
Admission: EM | Admit: 2024-08-09 | Discharge: 2024-08-09 | Disposition: A | Discharge status: Home / Self Care | Attending: Family Medicine | Admitting: Family Medicine

## 2024-08-09 ENCOUNTER — Ambulatory Visit: Payer: Self-pay | Admitting: Family Medicine

## 2024-08-09 DIAGNOSIS — M79671 Pain in right foot: Secondary | ICD-10-CM

## 2024-08-09 MED ORDER — PREDNISONE 20 MG PO TABS: 40.0000 mg | ORAL_TABLET | Freq: Every day | ORAL | 0 refills | Status: AC

## 2024-08-09 NOTE — ED Triage Notes (Signed)
 Pt reports right foot pain x 2 weeks, unsure if she has injured foot, pain when placing weight in the foot, sharp pains with every step on the foot.

## 2024-08-09 NOTE — Discharge Instructions (Signed)
 We will let you know if anything comes back abnormal on your foot x-ray.  In the meantime, I have sent over a course of steroid to help with pain and inflammation and you may do warm Epsom salt soaks, stretches, massage, wear supportive shoes.  Follow-up with a podiatrist if worsening or not improving

## 2024-08-10 NOTE — ED Provider Notes (Signed)
 [facility]    CSN: [id number] Arrival date & time: 08/09/24  0902      History   Chief Complaint No chief complaint on file.   HPI Amber Nolan is a 43 y.o. female.   Patient presenting today with 2-week history of right foot pain at the base of her toes extending toward the midfoot on the bottom of her foot.  She denies known injury, numbness, tingling, weakness, loss of range of motion, discoloration but does notice some swelling to this area.  Pain is worse with weightbearing, states sharp pains with every step.  So far trying over-the-counter pain relievers, ice, elevation with minimal relief.  No past history of similar.    Past Medical History:  Diagnosis Date   Anxiety and depression    Gastritis    GERD (gastroesophageal reflux disease)    Migraine    Migraines    Scoliosis    Stomach ulcer     Patient Active Problem List   Diagnosis Date Noted   GAD (generalized anxiety disorder) 03/10/2023   PTSD (post-traumatic stress disorder) 03/10/2023   Bipolar disorder with severe depression (HCC) 03/10/2023   Migraine with aura 01/10/2014    Past Surgical History:  Procedure Laterality Date   APPENDECTOMY  1993   CHOLECYSTECTOMY  2014   EXPLORATORY LAPAROTOMY  2015   RHINOPLASTY     TUBAL LIGATION  2013    OB History   No obstetric history on file.      Home Medications    Prior to Admission medications   Medication Sig Start Date End Date Taking? Authorizing Provider  predniSONE  (DELTASONE ) 20 MG tablet Take 2 tablets (40 mg total) by mouth daily with breakfast. 08/09/24  Yes Stuart Vernell Almarie, PA-C  ARIPiprazole  (ABILIFY ) 20 MG tablet Take 1 tablet (20 mg total) by mouth daily. 03/13/23 04/12/23  Massengill, Rankin, MD  butalbital -acetaminophen -caffeine  (FIORICET ) 50-325-40 MG tablet Take 1-2 tablets by mouth every 4 (four) hours as needed for headache or migraine. 12/29/19   [provider]  cetirizine  (ZYRTEC  ALLERGY)  10 MG tablet Take 1 tablet (10 mg total) by mouth daily. 11/12/22   Christopher Savannah, PA-C  cyclobenzaprine  (FLEXERIL ) 10 MG tablet Take 10 mg by mouth at bedtime. 01/17/20   [provider]  diclofenac  (VOLTAREN ) 75 MG EC tablet Take 75 mg by mouth 2 (two) times daily.    [provider]  ibuprofen  (ADVIL ) 200 MG tablet Take 600-800 mg by mouth 2 (two) times daily as needed for headache or moderate pain.    [provider]  lisinopril -hydrochlorothiazide  (ZESTORETIC ) 10-12.5 MG tablet Take 1 tablet by mouth daily.    [provider]  prazosin  (MINIPRESS ) 1 MG capsule Take 1 capsule (1 mg total) by mouth at bedtime. 03/12/23 04/11/23  Massengill, Rankin, MD  rizatriptan (MAXALT) 10 MG tablet Take 10 mg by mouth daily as needed for migraine.    [provider]  topiramate  (TOPAMAX ) 25 MG tablet Take 1 tablet (25 mg total) by mouth daily. 03/13/23 04/12/23  Massengill, Rankin, MD  venlafaxine  XR (EFFEXOR -XR) 150 MG 24 hr capsule Take 150 mg by mouth daily with breakfast.    [provider]    Family History Family History  Problem Relation Age of Onset   Liver cancer Father        LIVER   Diabetes Other    Hypertension Other    Hypothyroidism Other    Stroke Other    Heart disease Other  Bladder Cancer Neg Hx    Prostate cancer Neg Hx    Kidney cancer Neg Hx     Social History Social History   Tobacco Use   Smoking status: Never    Passive exposure: Never   Smokeless tobacco: Never  Vaping Use   Vaping status: Never Used  Substance Use Topics   Alcohol use: Yes    Comment: Social   Drug use: No     Allergies   Sulfa antibiotics   Review of Systems Review of Systems Per HPI  Physical Exam Triage Vital Signs ED Triage Vitals  Encounter Vitals Group     BP 08/09/24 0906 125/85     Girls Systolic BP Percentile --      Girls Diastolic BP Percentile --      Boys Systolic BP Percentile --      Boys Diastolic BP Percentile --       Pulse Rate 08/09/24 0906 79     Resp 08/09/24 0906 20     Temp 08/09/24 0906 98.2 F (36.8 C)     Temp Source 08/09/24 0906 Oral     SpO2 08/09/24 0906 96 %     Weight --      Height --      Head Circumference --      Peak Flow --      Pain Score 08/09/24 0909 6     Pain Loc --      Pain Education --      Exclude from Growth Chart --    No data found.  Updated Vital Signs BP 125/85 (BP Location: Right Arm)   Pulse 79   Temp 98.2 F (36.8 C) (Oral)   Resp 20   LMP 08/02/2024 (Within Days)   SpO2 96%   Visual Acuity Right Eye Distance:   Left Eye Distance:   Bilateral Distance:    Right Eye Near:   Left Eye Near:    Bilateral Near:     Physical Exam Vitals and nursing note reviewed.  Constitutional:      Appearance: Normal appearance. She is not ill-appearing.  HENT:     Head: Atraumatic.  Eyes:     Extraocular Movements: Extraocular movements intact.     Conjunctiva/sclera: Conjunctivae normal.  Cardiovascular:     Rate and Rhythm: Normal rate.  Pulmonary:     Effort: Pulmonary effort is normal.  Musculoskeletal:        General: Swelling and tenderness present. No signs of injury. Normal range of motion.     Cervical back: Normal range of motion and neck supple.     Comments: Mild localized edema to the plantar aspect just below the base of toes, tender to palpation in this area.  No bony deformity palpable and range of motion intact  Skin:    General: Skin is warm and dry.     Findings: No bruising or erythema.  Neurological:     Mental Status: She is alert and oriented to person, place, and time.     Comments: Right lower extremity neurovascularly intact  Psychiatric:        Mood and Affect: Mood normal.        Thought Content: Thought content normal.        Judgment: Judgment normal.      UC Treatments / Results  Labs (all labs ordered are listed, but only abnormal results are displayed) Labs Reviewed - No data to  display  EKG   Radiology  DG Foot Complete Right Result Date: 08/09/2024 EXAM: 3 OR MORE VIEW(S) XRAY OF THE RIGHT FOOT 08/09/2024 10:11:55 AM COMPARISON: 12/26/2013 CLINICAL HISTORY: right foot pain at base of toes x 1 week, no known injury FINDINGS: BONES AND JOINTS: No acute fracture. No focal osseous lesion. No joint dislocation. Mild dorsal spurring of the talar neck and of the anterior process of the calcaneus. SOFT TISSUES: Mild dorsal soft tissue swelling along the forefoot. IMPRESSION: 1. Mild dorsal soft tissue swelling along the forefoot. 2. Mild degenerative spurring at the talar neck. 3. Mild degenerative spurring at the anterior process of the calcaneus. Electronically signed by: Ryan Salvage MD 08/09/2024 10:35 AM EDT RP Workstation: [id number]    Procedures Procedures (including critical care time)  Medications Ordered in UC Medications - No data to display  Initial Impression / Assessment and Plan / UC Course  I have reviewed the triage vital signs and the nursing notes.  Pertinent labs & imaging results that were available during my care of the patient were reviewed by me and considered in my medical decision making (see chart for details).     X-ray of the right foot showing some mild soft tissue swelling in the area of her pain along the forefoot and some spurring toward the talus and the calcaneus.  Feel these are incidental findings at this point regarding the spurring, will treat for soft tissue inflammation with course of prednisone , Epsom salt soaks, massage, supportive shoes and follow-up with podiatry if not resolving.  Work note given.  Final Clinical Impressions(s) / UC Diagnoses   Final diagnoses:  Right foot pain     Discharge Instructions      We will let you know if anything comes back abnormal on your foot x-ray.  In the meantime, I have sent over a course of steroid to help with pain and inflammation and you may do warm Epsom salt soaks,  stretches, massage, wear supportive shoes.  Follow-up with a podiatrist if worsening or not improving    ED Prescriptions     Medication Sig Dispense Auth. Provider   predniSONE  (DELTASONE ) 20 MG tablet Take 2 tablets (40 mg total) by mouth daily with breakfast. 10 tablet Stuart Vernell Norris, [place]      PDMP not reviewed this encounter.   Stuart Vernell Norris, [place] 08/10/24 1644

## 2024-08-13 DIAGNOSIS — M79671 Pain in right foot: Secondary | ICD-10-CM | POA: Diagnosis not present

## 2024-09-20 DIAGNOSIS — F3181 Bipolar II disorder: Secondary | ICD-10-CM | POA: Diagnosis not present

## 2024-09-20 DIAGNOSIS — F4312 Post-traumatic stress disorder, chronic: Secondary | ICD-10-CM | POA: Diagnosis not present

## 2024-09-20 DIAGNOSIS — F6381 Intermittent explosive disorder: Secondary | ICD-10-CM | POA: Diagnosis not present

## 2024-10-10 ENCOUNTER — Emergency Department (HOSPITAL_COMMUNITY)
Admission: EM | Admit: 2024-10-10 | Discharge: 2024-10-10 | Disposition: A | Discharge status: Home / Self Care | Attending: Emergency Medicine | Admitting: Emergency Medicine

## 2024-10-10 ENCOUNTER — Emergency Department (HOSPITAL_COMMUNITY)

## 2024-10-10 ENCOUNTER — Other Ambulatory Visit: Payer: Self-pay

## 2024-10-10 ENCOUNTER — Encounter (HOSPITAL_COMMUNITY): Payer: Self-pay

## 2024-10-10 DIAGNOSIS — M545 Low back pain, unspecified: Secondary | ICD-10-CM | POA: Diagnosis present

## 2024-10-10 LAB — URINALYSIS, ROUTINE W REFLEX MICROSCOPIC
Bilirubin Urine: NEGATIVE
Glucose, UA: NEGATIVE mg/dL
Ketones, ur: NEGATIVE mg/dL
Leukocytes,Ua: NEGATIVE
Nitrite: NEGATIVE
Protein, ur: NEGATIVE mg/dL
Specific Gravity, Urine: 1.025 (ref 1.005–1.030)
pH: 5 (ref 5.0–8.0)

## 2024-10-10 MED ORDER — LIDOCAINE 5 % EX PTCH
1.0000 | MEDICATED_PATCH | CUTANEOUS | Status: DC
  Administered 2024-10-10: 1 via TRANSDERMAL
  Filled 2024-10-10: qty 1

## 2024-10-10 MED ORDER — METHOCARBAMOL 500 MG PO TABS: 500.0000 mg | ORAL_TABLET | Freq: Three times a day (TID) | ORAL | 0 refills | Status: AC

## 2024-10-10 MED ORDER — OXYCODONE-ACETAMINOPHEN 5-325 MG PO TABS
1.0000 | ORAL_TABLET | Freq: Once | ORAL | Status: AC
  Administered 2024-10-10: 1 via ORAL
  Filled 2024-10-10: qty 1

## 2024-10-10 MED ORDER — OXYCODONE-ACETAMINOPHEN 5-325 MG PO TABS: 1.0000 | ORAL_TABLET | ORAL | 0 refills | Status: AC | PRN

## 2024-10-10 NOTE — ED Triage Notes (Signed)
 Per pt mid back started 2 weeks. Radiating down to lower legs. Denies urinary changes.

## 2024-10-10 NOTE — Discharge Instructions (Signed)
 You may contact the neurosurgery office to arrange follow-up appointment.  Return to the emergency department for any new or worsening symptoms.

## 2024-10-10 NOTE — ED Provider Notes (Signed)
 [place] EMERGENCY DEPARTMENT AT [facility] Provider Note   CSN: [id number] Arrival date & time: 10/10/24  1007     Patient presents with: Back Pain   Amber Nolan is a 43 y.o. female.  {Add pertinent medical, surgical, social history, OB history to YEP:67052}  Back Pain       Amber Nolan is a 43 y.o. female past medical history of generalized anxiety disorder, PTSD, and scoliosis who presents to the Emergency Department complaining of worsening mid to left lower back pain for 2 weeks.  Denies any known injury.  Pain is constant but worsens with movement and with ambulation.  Denies any flank pain or dysuria symptoms.  States that she has chronic UTIs and completed a course of antibiotics 1 week ago.  She states that she always has pain from her scoliosis, but has been somewhat managed with diclofenac  for years.  She denies any pain numbness or weakness of her lower extremities, abdominal pain, urine or bowel changes, or saddle anesthesias.   Prior to Admission medications   Medication Sig Start Date End Date Taking? Authorizing Provider  ARIPiprazole  (ABILIFY ) 20 MG tablet Take 1 tablet (20 mg total) by mouth daily. 03/13/23 04/12/23  Massengill, Rankin, MD  butalbital -acetaminophen -caffeine  (FIORICET ) 50-325-40 MG tablet Take 1-2 tablets by mouth every 4 (four) hours as needed for headache or migraine. 12/29/19   [provider]  cetirizine  (ZYRTEC  ALLERGY) 10 MG tablet Take 1 tablet (10 mg total) by mouth daily. 11/12/22   Christopher Savannah, PA-C  cyclobenzaprine  (FLEXERIL ) 10 MG tablet Take 10 mg by mouth at bedtime. 01/17/20   [provider]  diclofenac  (VOLTAREN ) 75 MG EC tablet Take 75 mg by mouth 2 (two) times daily.    [provider]  ibuprofen  (ADVIL ) 200 MG tablet Take 600-800 mg by mouth 2 (two) times daily as needed for headache or moderate pain.    [provider]  lisinopril -hydrochlorothiazide  (ZESTORETIC )  10-12.5 MG tablet Take 1 tablet by mouth daily.    [provider]  prazosin  (MINIPRESS ) 1 MG capsule Take 1 capsule (1 mg total) by mouth at bedtime. 03/12/23 04/11/23  Massengill, Rankin, MD  predniSONE  (DELTASONE ) 20 MG tablet Take 2 tablets (40 mg total) by mouth daily with breakfast. 08/09/24   Stuart Vernell Almarie, PA-C  rizatriptan (MAXALT) 10 MG tablet Take 10 mg by mouth daily as needed for migraine.    [provider]  topiramate  (TOPAMAX ) 25 MG tablet Take 1 tablet (25 mg total) by mouth daily. 03/13/23 04/12/23  Massengill, Rankin, MD  venlafaxine  XR (EFFEXOR -XR) 150 MG 24 hr capsule Take 150 mg by mouth daily with breakfast.    [provider]    Allergies: Sulfa antibiotics    Review of Systems  Musculoskeletal:  Positive for back pain.    Updated Vital Signs BP 124/82 (BP Location: Right Arm)   Pulse 70   Temp 98 F (36.7 C) (Oral)   Resp 14   Ht 5' 4 (1.626 m)   SpO2 97%   BMI 36.05 kg/m   Physical Exam  (all labs ordered are listed, but only abnormal results are displayed) Labs Reviewed  URINALYSIS, ROUTINE W REFLEX MICROSCOPIC - Abnormal; Notable for the following components:      Result Value   APPearance HAZY (*)    Hgb urine dipstick SMALL (*)    Bacteria, UA RARE (*)    All other components within normal limits    EKG: None  Radiology: DG Thoracic Spine 2 View Result Date: 10/10/2024 EXAM: 2 VIEW(S) XRAY OF THE THORACIC SPINE 10/10/2024 11:30:00 AM COMPARISON: Scoliosis series dated 09/26/2020. CLINICAL HISTORY: Back pain history of scoliosis. FINDINGS: BONES: Moderate-to-severe rotatory sigmoid scoliosis of the thoracolumbar spine is present, with the mid thoracic curvature convex to the left and the thoracolumbar curvature convex to the right. The degree of scoliosis appears mildly improved in the interim. The levocurvature measures approximately 53 degrees on the current study, compared to 69 degrees on the previous study. The  dextroscoliotic curvature has decreased from 48 degrees to 35 degrees. No obvious acute fracture. No aggressive appearing osseous lesion. DISCS AND DEGENERATIVE CHANGES: No severe degenerative changes. SOFT TISSUES: The visualized lungs are clear. IMPRESSION: 1. Moderate-to-severe rotatory sigmoid scoliosis of the thoracolumbar spine, with the mid thoracic curvature convex to the left and the thoracolumbar curvature convex to the right. The degree of scoliosis appears mildly improved in the interim, with the levocurvature measuring approximately 53 degrees on the current study and 69 degrees on the previous study. The dextroscoliotic curvature has decreased from 48 degrees to 35 degrees. 2. No acute fracture. Electronically signed by: Evalene Coho MD 10/10/2024 11:39 AM EST RP Workstation: [id number]    {Document cardiac monitor, telemetry assessment procedure when appropriate:32947} Procedures   Medications Ordered in the ED  lidocaine  (LIDODERM ) 5 % 1 patch (1 patch Transdermal Patch Applied 10/10/24 1134)  oxyCODONE -acetaminophen  (PERCOCET/ROXICET) 5-325 MG per tablet 1 tablet (1 tablet Oral Given 10/10/24 1134)  oxyCODONE -acetaminophen  (PERCOCET/ROXICET) 5-325 MG per tablet 1 tablet (1 tablet Oral Given 10/10/24 1226)      {Click here for ABCD2, HEART and other calculators REFRESH Note before signing:1}                              Medical Decision Making   Patient here with known history of scoliosis here with worsening back pain for 2 weeks.  No known injury.  No saddle anesthesias numbness or weakness of her extremities.  No urine or bowel changes.  No fever or chills.  She has chronically been on diclofenac  for her symptoms, but recently it medication has not been helping.  On my exam, she is ambulatory, steady gait.  She has localizing tenderness along her lower thoracic upper lumbar spine and left paraspinal muscles.  She also endorses chronic UTIs, recently finished course of  antibiotics 1 week ago.  Is not having dysuria symptoms or flank pain.  No fever.  I doubt emergent process.  No red flags on her exam today concerning for cauda equina.  I suspect acute on chronic pain from her scoliosis.  I have reviewed medical records.  She had imaging for scoliosis several years ago.  No recent imaging  Amount and/or Complexity of Data Reviewed Labs: ordered.    Details: Urinalysis without evidence of infection Radiology: ordered.    Details: C-spine imaging obtained here without evidence of acute fracture.  There is moderate to severe scoliosis at thoracolumbar spine.  Degree of scoliosis appears mildly improved from interim Discussion of management or test interpretation with external provider(s):   Pain improved after medication here.  Workup without evidence of urinary tract infection.  Back pain likely acute on chronic and secondary to her scoliosis.  Doubt emergent process.  No red flags.  I feel that she is appropriate for discharge home.  Will provide   Risk Prescription drug management.     {Document critical care  time when appropriate  Document review of labs and clinical decision tools ie CHADS2VASC2, etc  Document your independent review of radiology images and any outside records  Document your discussion with family members, caretakers and with consultants  Document social determinants of health affecting pt's care  Document your decision making why or why not admission, treatments were needed:32947:::1}   Final diagnoses:  None    ED Discharge Orders     None
